# Patient Record
Sex: Female | Born: 1940 | Race: White | Hispanic: No | Marital: Married | Smoking: Former smoker
Health system: Southern US, Community
[De-identification: ages and names within clinical notes are randomized; demographics above are authoritative.]

## PROBLEM LIST (undated history)

## (undated) DIAGNOSIS — M8580 Other specified disorders of bone density and structure, unspecified site: Secondary | ICD-10-CM

## (undated) DIAGNOSIS — E785 Hyperlipidemia, unspecified: Secondary | ICD-10-CM

## (undated) DIAGNOSIS — H269 Unspecified cataract: Secondary | ICD-10-CM

## (undated) DIAGNOSIS — H409 Unspecified glaucoma: Secondary | ICD-10-CM

## (undated) DIAGNOSIS — I10 Essential (primary) hypertension: Secondary | ICD-10-CM

## (undated) HISTORY — DX: Unspecified glaucoma: H40.9

## (undated) HISTORY — DX: Unspecified cataract: H26.9

## (undated) HISTORY — PX: ABDOMINAL HYSTERECTOMY: SHX81

## (undated) HISTORY — DX: Other specified disorders of bone density and structure, unspecified site: M85.80

## (undated) HISTORY — DX: Essential (primary) hypertension: I10

## (undated) HISTORY — DX: Hyperlipidemia, unspecified: E78.5

---

## 1999-06-08 LAB — FECAL OCCULT BLOOD, GUAIAC: Fecal Occult Blood: NEGATIVE

## 1999-06-29 ENCOUNTER — Encounter: Payer: Self-pay | Admitting: Family Medicine

## 1999-06-29 ENCOUNTER — Encounter: Admission: RE | Admit: 1999-06-29 | Discharge: 1999-06-29 | Payer: Self-pay | Admitting: Family Medicine

## 2000-07-22 ENCOUNTER — Encounter: Payer: Self-pay | Admitting: Family Medicine

## 2000-07-22 ENCOUNTER — Encounter: Admission: RE | Admit: 2000-07-22 | Discharge: 2000-07-22 | Payer: Self-pay | Admitting: Family Medicine

## 2001-07-23 ENCOUNTER — Encounter: Admission: RE | Admit: 2001-07-23 | Discharge: 2001-07-23 | Payer: Self-pay | Admitting: Family Medicine

## 2001-07-23 ENCOUNTER — Encounter: Payer: Self-pay | Admitting: Family Medicine

## 2002-10-20 ENCOUNTER — Encounter: Admission: RE | Admit: 2002-10-20 | Discharge: 2002-10-20 | Payer: Self-pay | Admitting: Family Medicine

## 2002-10-20 ENCOUNTER — Encounter: Payer: Self-pay | Admitting: Family Medicine

## 2003-11-11 ENCOUNTER — Ambulatory Visit: Payer: Self-pay | Admitting: Family Medicine

## 2003-11-28 ENCOUNTER — Encounter: Admission: RE | Admit: 2003-11-28 | Discharge: 2003-11-28 | Payer: Self-pay | Admitting: Family Medicine

## 2004-05-15 ENCOUNTER — Ambulatory Visit: Payer: Self-pay | Admitting: Family Medicine

## 2004-05-17 ENCOUNTER — Ambulatory Visit: Payer: Self-pay | Admitting: Family Medicine

## 2004-09-04 ENCOUNTER — Ambulatory Visit: Payer: Self-pay | Admitting: Family Medicine

## 2004-11-14 ENCOUNTER — Ambulatory Visit: Payer: Self-pay | Admitting: Family Medicine

## 2006-01-07 LAB — HM COLONOSCOPY

## 2006-01-09 ENCOUNTER — Ambulatory Visit: Payer: Self-pay | Admitting: Family Medicine

## 2006-01-10 ENCOUNTER — Ambulatory Visit: Payer: Self-pay | Admitting: Gastroenterology

## 2006-01-15 ENCOUNTER — Ambulatory Visit: Payer: Self-pay | Admitting: Cardiology

## 2006-01-24 ENCOUNTER — Encounter: Payer: Self-pay | Admitting: Family Medicine

## 2006-01-24 ENCOUNTER — Encounter (INDEPENDENT_AMBULATORY_CARE_PROVIDER_SITE_OTHER): Payer: Self-pay | Admitting: Specialist

## 2006-01-24 ENCOUNTER — Ambulatory Visit: Payer: Self-pay | Admitting: Gastroenterology

## 2006-01-31 ENCOUNTER — Ambulatory Visit: Payer: Self-pay | Admitting: Family Medicine

## 2006-01-31 LAB — CONVERTED CEMR LAB
Albumin: 4 g/dL (ref 3.5–5.2)
Calcium: 10 mg/dL (ref 8.4–10.5)
Eosinophils Absolute: 0.1 10*3/uL (ref 0.0–0.6)
Eosinophils Relative: 1.9 % (ref 0.0–5.0)
GFR calc Af Amer: 81 mL/min
GFR calc non Af Amer: 67 mL/min
HCT: 42.4 % (ref 36.0–46.0)
Hemoglobin: 14.8 g/dL (ref 12.0–15.0)
LDL Cholesterol: 86 mg/dL (ref 0–99)
MCHC: 35 g/dL (ref 30.0–36.0)
MCV: 92.3 fL (ref 78.0–100.0)
Monocytes Relative: 8.7 % (ref 3.0–11.0)
Neutro Abs: 5.4 10*3/uL (ref 1.4–7.7)
Neutrophils Relative %: 69.9 % (ref 43.0–77.0)
Platelets: 254 10*3/uL (ref 150–400)
Potassium: 4.4 meq/L (ref 3.5–5.1)
RBC: 4.6 M/uL (ref 3.87–5.11)
RDW: 12.4 % (ref 11.5–14.6)
Triglycerides: 192 mg/dL — ABNORMAL HIGH (ref 0–149)
VLDL: 38 mg/dL (ref 0–40)
WBC: 7.7 10*3/uL (ref 4.5–10.5)

## 2006-02-14 ENCOUNTER — Encounter: Admission: RE | Admit: 2006-02-14 | Discharge: 2006-02-14 | Payer: Self-pay | Admitting: Family Medicine

## 2006-04-28 ENCOUNTER — Ambulatory Visit: Payer: Self-pay | Admitting: Family Medicine

## 2006-04-28 LAB — CONVERTED CEMR LAB
ALT: 16 units/L (ref 0–40)
Cholesterol: 143 mg/dL (ref 0–200)
Hgb A1c MFr Bld: 6.5 % — ABNORMAL HIGH (ref 4.6–6.0)
LDL Cholesterol: 71 mg/dL (ref 0–99)
Total CHOL/HDL Ratio: 3.1
Triglycerides: 131 mg/dL (ref 0–149)

## 2006-07-22 ENCOUNTER — Ambulatory Visit: Payer: Self-pay | Admitting: Gastroenterology

## 2006-07-22 LAB — CONVERTED CEMR LAB
Albumin: 3.8 g/dL (ref 3.5–5.2)
BUN: 6 mg/dL (ref 6–23)
Calcium: 9.6 mg/dL (ref 8.4–10.5)
Chloride: 102 meq/L (ref 96–112)
Creatinine, Ser: 0.8 mg/dL (ref 0.4–1.2)
Sodium: 140 meq/L (ref 135–145)
Total Bilirubin: 0.9 mg/dL (ref 0.3–1.2)

## 2006-07-28 ENCOUNTER — Ambulatory Visit: Payer: Self-pay | Admitting: Cardiology

## 2007-02-16 ENCOUNTER — Encounter: Admission: RE | Admit: 2007-02-16 | Discharge: 2007-02-16 | Payer: Self-pay | Admitting: Family Medicine

## 2007-02-18 ENCOUNTER — Encounter (INDEPENDENT_AMBULATORY_CARE_PROVIDER_SITE_OTHER): Payer: Self-pay | Admitting: *Deleted

## 2007-03-05 ENCOUNTER — Encounter (INDEPENDENT_AMBULATORY_CARE_PROVIDER_SITE_OTHER): Payer: Self-pay | Admitting: *Deleted

## 2007-03-12 ENCOUNTER — Encounter: Payer: Self-pay | Admitting: Family Medicine

## 2007-03-12 DIAGNOSIS — E119 Type 2 diabetes mellitus without complications: Secondary | ICD-10-CM | POA: Insufficient documentation

## 2007-03-12 DIAGNOSIS — H409 Unspecified glaucoma: Secondary | ICD-10-CM | POA: Insufficient documentation

## 2007-03-12 DIAGNOSIS — I10 Essential (primary) hypertension: Secondary | ICD-10-CM

## 2007-03-12 DIAGNOSIS — E785 Hyperlipidemia, unspecified: Secondary | ICD-10-CM

## 2007-03-25 ENCOUNTER — Ambulatory Visit: Payer: Self-pay | Admitting: Family Medicine

## 2007-03-27 LAB — CONVERTED CEMR LAB
Albumin: 4.2 g/dL (ref 3.5–5.2)
BUN: 13 mg/dL (ref 6–23)
Basophils Absolute: 0.1 10*3/uL (ref 0.0–0.1)
CO2: 32 meq/L (ref 19–32)
Calcium: 10 mg/dL (ref 8.4–10.5)
Chloride: 96 meq/L (ref 96–112)
Cholesterol: 143 mg/dL (ref 0–200)
Creatinine, Ser: 0.9 mg/dL (ref 0.4–1.2)
GFR calc non Af Amer: 66 mL/min
Glucose, Bld: 150 mg/dL — ABNORMAL HIGH (ref 70–99)
HDL: 46.5 mg/dL (ref >39.0)
Hemoglobin: 15.4 g/dL — ABNORMAL HIGH (ref 12.0–15.0)
Hgb A1c MFr Bld: 7 % — ABNORMAL HIGH (ref 4.6–6.0)
Lymphocytes Relative: 19.5 % (ref 12.0–46.0)
MCHC: 33.2 g/dL (ref 30.0–36.0)
Neutro Abs: 6.2 10*3/uL (ref 1.4–7.7)
RBC: 5.07 M/uL (ref 3.87–5.11)
Total CHOL/HDL Ratio: 3.1
Triglycerides: 147 mg/dL (ref 0–149)
VLDL: 29 mg/dL (ref 0–40)
WBC: 8.8 10*3/uL (ref 4.5–10.5)

## 2007-04-10 ENCOUNTER — Encounter: Payer: Self-pay | Admitting: Family Medicine

## 2007-04-10 ENCOUNTER — Ambulatory Visit: Payer: Self-pay | Admitting: Internal Medicine

## 2007-04-14 ENCOUNTER — Telehealth: Payer: Self-pay | Admitting: Family Medicine

## 2007-04-14 DIAGNOSIS — N951 Menopausal and female climacteric states: Secondary | ICD-10-CM

## 2007-06-22 ENCOUNTER — Ambulatory Visit: Payer: Self-pay | Admitting: Family Medicine

## 2007-06-23 ENCOUNTER — Ambulatory Visit: Payer: Self-pay | Admitting: Family Medicine

## 2007-06-23 LAB — CONVERTED CEMR LAB: Hgb A1c MFr Bld: 6.8 % — ABNORMAL HIGH (ref 4.6–6.0)

## 2008-02-18 ENCOUNTER — Encounter: Admission: RE | Admit: 2008-02-18 | Discharge: 2008-02-18 | Payer: Self-pay | Admitting: Family Medicine

## 2008-02-22 ENCOUNTER — Encounter (INDEPENDENT_AMBULATORY_CARE_PROVIDER_SITE_OTHER): Payer: Self-pay | Admitting: *Deleted

## 2008-03-29 ENCOUNTER — Telehealth (INDEPENDENT_AMBULATORY_CARE_PROVIDER_SITE_OTHER): Payer: Self-pay | Admitting: *Deleted

## 2008-08-02 ENCOUNTER — Ambulatory Visit: Payer: Self-pay | Admitting: Family Medicine

## 2008-08-05 ENCOUNTER — Telehealth: Payer: Self-pay | Admitting: Family Medicine

## 2008-08-05 LAB — CONVERTED CEMR LAB
AST: 16 units/L (ref 0–37)
Albumin: 4.1 g/dL (ref 3.5–5.2)
Alkaline Phosphatase: 69 units/L (ref 39–117)
BUN: 9 mg/dL (ref 6–23)
Basophils Absolute: 0.1 10*3/uL (ref 0.0–0.1)
Bilirubin, Direct: 0.1 mg/dL (ref 0.0–0.3)
HDL: 49.6 mg/dL (ref >39.00)
Hemoglobin: 14.5 g/dL (ref 12.0–15.0)
Hgb A1c MFr Bld: 7 % — ABNORMAL HIGH (ref 4.6–6.5)
Lymphocytes Relative: 20 % (ref 12.0–46.0)
MCHC: 34.6 g/dL (ref 30.0–36.0)
Microalb, Ur: 0.2 mg/dL (ref 0.0–1.9)
Monocytes Absolute: 0.5 10*3/uL (ref 0.1–1.0)
Neutro Abs: 5.1 10*3/uL (ref 1.4–7.7)
Neutrophils Relative %: 70.4 % (ref 43.0–77.0)
Potassium: 4 meq/L (ref 3.5–5.1)
RBC: 4.56 M/uL (ref 3.87–5.11)
Total CHOL/HDL Ratio: 3
Total Protein: 6.7 g/dL (ref 6.0–8.3)
VLDL: 28 mg/dL (ref 0.0–40.0)
Vit D, 25-Hydroxy: 25 ng/mL — ABNORMAL LOW (ref 30–89)

## 2008-08-08 ENCOUNTER — Encounter: Payer: Self-pay | Admitting: Family Medicine

## 2008-08-08 ENCOUNTER — Encounter: Admission: RE | Admit: 2008-08-08 | Discharge: 2008-11-06 | Payer: Self-pay | Admitting: Family Medicine

## 2008-08-09 ENCOUNTER — Telehealth: Payer: Self-pay | Admitting: Family Medicine

## 2008-08-11 ENCOUNTER — Encounter: Payer: Self-pay | Admitting: Family Medicine

## 2008-11-14 ENCOUNTER — Ambulatory Visit: Payer: Self-pay | Admitting: Family Medicine

## 2008-11-15 LAB — CONVERTED CEMR LAB: Hgb A1c MFr Bld: 6.7 % — ABNORMAL HIGH (ref 4.6–6.5)

## 2009-01-30 ENCOUNTER — Ambulatory Visit: Payer: Self-pay | Admitting: Family Medicine

## 2009-01-31 LAB — CONVERTED CEMR LAB
ALT: 15 units/L (ref 0–35)
AST: 18 units/L (ref 0–37)
HDL: 44.7 mg/dL (ref >39.00)
Hgb A1c MFr Bld: 6.3 % (ref 4.6–6.5)
LDL Cholesterol: 56 mg/dL (ref 0–99)
Total CHOL/HDL Ratio: 3
Triglycerides: 118 mg/dL (ref 0.0–149.0)
VLDL: 23.6 mg/dL (ref 0.0–40.0)

## 2009-02-14 ENCOUNTER — Encounter: Payer: Self-pay | Admitting: Family Medicine

## 2009-02-20 ENCOUNTER — Encounter: Admission: RE | Admit: 2009-02-20 | Discharge: 2009-02-20 | Payer: Self-pay | Admitting: Family Medicine

## 2009-02-22 ENCOUNTER — Encounter (INDEPENDENT_AMBULATORY_CARE_PROVIDER_SITE_OTHER): Payer: Self-pay | Admitting: *Deleted

## 2009-07-05 ENCOUNTER — Ambulatory Visit: Payer: Self-pay | Admitting: Family Medicine

## 2009-07-06 LAB — CONVERTED CEMR LAB
ALT: 10 units/L (ref 0–35)
Albumin: 4.2 g/dL (ref 3.5–5.2)
Alkaline Phosphatase: 50 units/L (ref 39–117)
Bilirubin, Direct: 0.2 mg/dL (ref 0.0–0.3)
Cholesterol: 121 mg/dL (ref 0–200)
HDL: 40.7 mg/dL (ref >39.00)
Hgb A1c MFr Bld: 6.1 % (ref 4.6–6.5)
Lymphocytes Relative: 20 % (ref 12.0–46.0)
MCHC: 34.8 g/dL (ref 30.0–36.0)
Phosphorus: 3 mg/dL (ref 2.3–4.6)
Potassium: 4.1 meq/L (ref 3.5–5.1)
Sodium: 138 meq/L (ref 135–145)
Total Bilirubin: 0.6 mg/dL (ref 0.3–1.2)
VLDL: 37.2 mg/dL (ref 0.0–40.0)
Vit D, 25-Hydroxy: 32 ng/mL (ref 30–89)
WBC: 8.8 10*3/uL (ref 4.5–10.5)

## 2009-08-04 ENCOUNTER — Encounter: Payer: Self-pay | Admitting: Family Medicine

## 2009-08-04 ENCOUNTER — Ambulatory Visit: Payer: Self-pay | Admitting: Internal Medicine

## 2009-08-30 ENCOUNTER — Encounter: Payer: Self-pay | Admitting: Family Medicine

## 2010-01-02 ENCOUNTER — Ambulatory Visit: Payer: Self-pay | Admitting: Family Medicine

## 2010-01-05 LAB — CONVERTED CEMR LAB
ALT: 11 units/L (ref 0–35)
BUN: 14 mg/dL (ref 6–23)
CO2: 30 meq/L (ref 19–32)
Calcium: 9.7 mg/dL (ref 8.4–10.5)
GFR calc non Af Amer: 72.26 mL/min (ref >60.00)
Glucose, Bld: 115 mg/dL — ABNORMAL HIGH (ref 70–99)
HDL: 44.3 mg/dL (ref >39.00)
Hgb A1c MFr Bld: 6.2 % (ref 4.6–6.5)
Phosphorus: 3.6 mg/dL (ref 2.3–4.6)
Potassium: 5 meq/L (ref 3.5–5.1)
Sodium: 134 meq/L — ABNORMAL LOW (ref 135–145)
Total CHOL/HDL Ratio: 3
Triglycerides: 91 mg/dL (ref 0.0–149.0)
VLDL: 18.2 mg/dL (ref 0.0–40.0)

## 2010-02-08 NOTE — Assessment & Plan Note (Signed)
Summary: FOLLOW UP AFTER LABS IN JAN/RI   Vital Signs:  Patient profile:   70 year old female Height:      61 inches Weight:      120 pounds BMI:     22.76 Temp:     98.4 degrees F oral Pulse rate:   68 / minute Pulse rhythm:   regular BP sitting:   132 / 78  (left arm) Cuff size:   regular  Vitals Entered By: Lewanda Rife LPN (July 05, 2009 12:33 PM) CC: follow-up visit   History of Present Illness: here for f/u of HTN and DM and lipids   is doing well  keeps 2 grandaughters- that keeps her busy  has been feeling good overall   at times - energy level gets low - off and on  may be the heat  no other physical symptoms  does not think she is overdoing it   sleeping ok   last labs 6 mo ago   last AIC 6.3- good control opthy up to date  wt is down 11 lb this visit -- is not really trying to loose  overall blood sugars are good -- usually 89 - low one teens , one time sugar got low and she recognized it right away (? what caused it - walking at a yard sale)  is eating quite a bit less- watches what she eats with smaller portions / also extremely active outdoors playing with the kids -- for example basketball    bp in good control wtih 132/78  is due for check of vit D after supplementation    Allergies (verified): No Known Drug Allergies  Past History:  Past Medical History: Last updated: 06/23/2007 Diabetes mellitus, type II Hyperlipidemia Hypertension osteopenia  Past Surgical History: Last updated: 03/25/2007 Hysterectomy- ovaries intact- (non cancer) - for bleeding  Colonoscopy- polyp, tics (01/2006)  Family History: Last updated: April 04, 2007 Father: deceased CAD Mother: deceased HTN, CAD Siblings: 3 brothers, 1 with diabetes, 1 died from lymph node cancer  Social History: Last updated: 03/25/2007 Marital Status: Married Children: 2 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 Eyes:  Denies blurring and eye irritation. CV:  Denies chest pain or discomfort, lightheadness, and palpitations. Resp:  Denies cough and wheezing. GI:  Denies abdominal pain, change in bowel habits, and indigestion. GU:  Denies hematuria and urinary frequency. MS:  Denies joint redness, joint swelling, and cramps. Derm:  Denies itching, lesion(s), poor wound healing, and rash. Neuro:  Denies numbness and tingling. Psych:  mood is ok. Endo:  Denies cold intolerance, excessive thirst, excessive urination, and heat intolerance. Heme:  Denies abnormal bruising and bleeding.  Physical Exam  General:  Well-developed,well-nourished,in no acute distress; alert,appropriate and cooperative throughout examination Head:  normocephalic, atraumatic, and no abnormalities observed.   Eyes:  vision grossly intact, pupils equal, pupils round, and pupils reactive to light.   Mouth:  pharynx pink and moist.   Neck:  supple with full rom and no masses or thyromegally, no JVD or carotid bruit  Lungs:  Normal respiratory effort, chest expands symmetrically. Lungs are clear to auscultation, no crackles or wheezes. Heart:  Normal rate and regular rhythm. S1 and S2 normal without gallop, murmur, click, rub or other extra sounds. Abdomen:  Bowel sounds positive,abdomen soft and non-tender without  masses, organomegaly or hernias noted. no renal bruits  Msk:  No deformity or scoliosis noted of thoracic or lumbar spine.   Pulses:  R and L carotid,radial,femoral,dorsalis pedis and posterior tibial pulses are full and equal bilaterally Extremities:  No clubbing, cyanosis, edema, or deformity noted with normal full range of motion of all joints.   Neurologic:  sensation intact to light touch, gait normal, and DTRs symmetrical and normal.   Skin:  Intact without suspicious lesions or rashes Cervical  Nodes:  No lymphadenopathy noted Psych:  normal affect, talkative and pleasant   Diabetes Management Exam:    Foot Exam (with socks and/or shoes not present):       Sensory-Pinprick/Light touch:          Left medial foot (L-4): normal          Left dorsal foot (L-5): normal          Left lateral foot (S-1): normal          Right medial foot (L-4): normal          Right dorsal foot (L-5): normal          Right lateral foot (S-1): normal       Sensory-Monofilament:          Left foot: normal          Right foot: normal       Inspection:          Left foot: normal          Right foot: normal       Nails:          Left foot: normal          Right foot: normal   Impression & Recommendations:  Problem # 1:  HYPERTENSION (ICD-401.9) Assessment Unchanged  fair control with current meds and exercise no changes  lab today Her updated medication list for this problem includes:    Bisoprolol-hydrochlorothiazide 5-6.25 Mg Tabs (Bisoprolol-hydrochlorothiazide) .Marland Kitchen... 1 by mouth once daily    Altace 5 Mg Caps (Ramipril) .Marland Kitchen... 1 by mouth once daily    Hydrochlorothiazide 25 Mg Tabs (Hydrochlorothiazide) .Marland Kitchen... Take one tablet by mouth daily  BP today: 132/78 Prior BP: 120/70 (11/14/2008)  Labs Reviewed: K+: 4.0 (08/02/2008) Creat: : 0.8 (08/02/2008)   Chol: 124 (01/30/2009)   HDL: 44.70 (01/30/2009)   LDL: 56 (01/30/2009)   TG: 118.0 (01/30/2009)  Orders: Venipuncture (16109) TLB-Lipid Panel (80061-LIPID) TLB-Renal Function Panel (80069-RENAL) TLB-CBC Platelet - w/Differential (85025-CBCD) TLB-Hepatic/Liver Function Pnl (80076-HEPATIC) TLB-TSH (Thyroid Stimulating Hormone) (84443-TSH) TLB-A1C / Hgb A1C (Glycohemoglobin) (83036-A1C) T-Vitamin D (25-Hydroxy) (60454-09811) Prescription Created Electronically [phone])  Problem # 2:  FATIGUE (ICD-780.79) Assessment: New new and poss age rel/ multifactorial/ very busy schedule  no specific symptoms and is intermittent lab today and  update Orders: Venipuncture (29562) TLB-Lipid Panel (80061-LIPID) TLB-Renal Function Panel (80069-RENAL) TLB-CBC Platelet - w/Differential (85025-CBCD) TLB-Hepatic/Liver Function Pnl (80076-HEPATIC) TLB-TSH (Thyroid Stimulating Hormone) (84443-TSH) TLB-A1C / Hgb A1C (Glycohemoglobin) (83036-A1C)  Problem # 3:  HYPERLIPIDEMIA (ICD-272.4) Assessment: Unchanged has been controlled with lipitor and diet  lab today  Her updated medication list for this problem includes:    Lipitor 20 Mg Tabs (Atorvastatin calcium) .Marland Kitchen... 1 by mouth once daily  Orders: Venipuncture (13086) TLB-Lipid Panel (80061-LIPID) TLB-Renal Function Panel (80069-RENAL) TLB-CBC Platelet - w/Differential (85025-CBCD) TLB-Hepatic/Liver Function Pnl (80076-HEPATIC) TLB-TSH (Thyroid Stimulating Hormone) (84443-TSH) TLB-A1C / Hgb A1C (Glycohemoglobin) (83036-A1C) T-Vitamin D (25-Hydroxy) (57846-96295) Prescription Created Electronically [phone])  Labs Reviewed: SGOT: 18 (  01/30/2009)   SGPT: 15 (01/30/2009)   HDL:44.70 (01/30/2009), 49.60 (08/02/2008)  LDL:56 (01/30/2009), 56 (08/02/2008)  Chol:124 (01/30/2009), 134 (08/02/2008)  Trig:118.0 (01/30/2009), 140.0 (08/02/2008)  Problem # 4:  DIABETES MELLITUS, TYPE II (ICD-250.00) Assessment: Unchanged sugar has been very well controlled in light of wt loss (dec portions and more exercise)- adv pt to inc portions a bit now  she continues to be very compliant opthy utd  adv to update if any further hypoglycemia  lab today and update f/u 6 mo  (earlier if further wt loss)  Her updated medication list for this problem includes:    Altace 5 Mg Caps (Ramipril) .Marland Kitchen... 1 by mouth once daily    Glucophage 500 Mg Tabs (Metformin hcl) .Marland Kitchen... 1 by mouth two times a day  Orders: Venipuncture (69629) TLB-Lipid Panel (80061-LIPID) TLB-Renal Function Panel (80069-RENAL) TLB-CBC Platelet - w/Differential (85025-CBCD) TLB-Hepatic/Liver Function Pnl (80076-HEPATIC) TLB-TSH (Thyroid  Stimulating Hormone) (84443-TSH) TLB-A1C / Hgb A1C (Glycohemoglobin) (83036-A1C) T-Vitamin D (25-Hydroxy) (52841-32440) Prescription Created Electronically [id number])  Complete Medication List: 1)  Lipitor 20 Mg Tabs (Atorvastatin calcium) .Marland Kitchen.. 1 by mouth once daily 2)  Bisoprolol-hydrochlorothiazide 5-6.25 Mg Tabs (Bisoprolol-hydrochlorothiazide) .Marland Kitchen.. 1 by mouth once daily 3)  Altace 5 Mg Caps (Ramipril) .Marland Kitchen.. 1 by mouth once daily 4)  Hydrochlorothiazide 25 Mg Tabs (Hydrochlorothiazide) .... Take one tablet by mouth daily 5)  Xalatan 0.005 % Soln (Latanoprost) .... Use eye gtts as directed 6)  Timolol Maleate 0.5 % Soln (Timolol maleate) .... Use eye gtts as directed 7)  Fosamax 70 Mg Tabs (Alendronate sodium) .Marland Kitchen.. 1 by mouth every week as directed 8)  Glucometer  .... To check sugar two times a day and as needed for new dm 250.0 9)  Onetouch Ultra Test Strp (Glucose blood) .... For one touch mini to check glucose two times a day and as needed for new dm 250.0 10)  Onetouch Ultrasoft Lancets Misc (Lancets) .... To check glucose two times a day and as needed for new dm 250.0 11)  Glucophage 500 Mg Tabs (Metformin hcl) .Marland Kitchen.. 1 by mouth two times a day  Patient Instructions: 1)  labs today- for fatigue and other medical issue  2)  ok to increase your portions slightly - I do not want you to loose more weight  3)  stay as active as you can  4)  update me if fatigue worsens  5)  follow up in about 6 months  Prescriptions: GLUCOPHAGE 500 MG TABS (METFORMIN HCL) 1 by mouth two times a day  #60 x 11   Entered and Authorized by:   Judith Part MD   Signed by:   Judith Part MD on 07/05/2009   Method used:   Electronically to        [facility] [address] [id number] (retail)       [address]       [place], [place]  [address]       Ph: [phone]       Fax: [phone]   RxID:   [id number] FOSAMAX 70 MG  TABS (ALENDRONATE SODIUM) 1 by mouth every week as directed   #4 x 11   Entered and Authorized by:   Judith Part MD   Signed by:   Judith Part MD on 07/05/2009   Method used:   Electronically to        [facility]  [address] [id number] ([facility]ail[facility]  [address]       [place], [place]  [address]       Ph: [phone]       Fax: [phone]   RxID:   [id number] HYDROCHLOROTHIAZIDE 25 MG  TABS (HYDROCHLOROTHIAZIDE) take one tablet by mouth daily  #30 x 11   Entered and Authorized by:   Judith Part MD   Signed by:   Judith Part MD on 07/05/2009   Method used:   Electronically to        [facility] [address] [id number] ([facility])       [address]       [place], [place]  [address]       Ph: [phone]       Fax: [phone]   RxID:   [id number] ALTACE 5 MG CAPS (RAMIPRIL) 1 by mouth once daily  #30 x 11   Entered and Authorized by:   Judith Part MD   Signed by:   Judith Part MD on 07/05/2009   Method used:   Electronically to        [facility] [address] [id number] ([facility])       [address]       [place], [place]  [address]       Ph: [phone]       Fax: [phone]   RxID:   [id number] BISOPROLOL-HYDROCHLOROTHIAZIDE 5-6.25 MG TABS (BISOPROLOL-HYDROCHLOROTHIAZIDE) 1 by mouth once daily  #30 x 11   Entered and Authorized by:   Judith Part MD   Signed by:   Judith Part MD on 07/05/2009   Method used:   Electronically to        [facility] [address] [id number] ([facility]       [address]       [place], [place]  [address]       Ph: [phone]       Fax: [phone]   RxID:   [id number] LIPITOR 20 MG TABS (ATORVASTATIN CALCIUM) 1 by mouth once daily  #30 x 11   Entered and Authorized by:   Judith Part MD   Signed by:   Judith Part MD on 07/05/2009   Method used:   Electronically to        [facility] [facility] [address] [id number]       [address]       [place], [place]  [address]        Ph: [phone]       Fax: [phone]   RxID:   [id number]   Current Allergies (reviewed today): No known allergies

## 2010-02-08 NOTE — Letter (Signed)
Summary: [facility]   Imported By: Lanelle Bal 09/06/2009 09:50:33  _____________________________________________________________________  External Attachment:    Type:   Image     Comment:   External Document  Appended Document: [facility]     Clinical Lists Changes  Observations: Added new observation of DMEYEEXAMNXT: 09/2010 (09/06/2009 20:09) Added new observation of DMEYEEXMRES: normal (08/30/2009 20:10) Added new observation of EYE EXAM BY: Dr Hazle Quant (08/30/2009 20:10) Added new observation of DIAB EYE EX: normal (08/30/2009 20:10)        Diabetes Management Exam:    Eye Exam:       Eye Exam done elsewhere          Date: 08/30/2009          Results: normal          Done by: Dr Hazle Quant

## 2010-02-08 NOTE — Miscellaneous (Signed)
Summary: BONE DENSITY  Clinical Lists Changes  Orders: Added new Test order of T-Bone Densitometry (77080) - Signed Added new Test order of T-Lumbar Vertebral Assessment (77082) - Signed 

## 2010-02-08 NOTE — Miscellaneous (Signed)
   Clinical Lists Changes  Observations: Added new observation of LAST MAM DAT: 02-20-09 (02/22/2009 10:25) Added new observation of MAMMOGRAM: NORMAIL (02/22/2009 10:25)

## 2010-02-08 NOTE — Letter (Signed)
Summary: [facility]   Imported By: Lanelle Bal 03/23/2009 13:09:47  _____________________________________________________________________  External Attachment:    Type:   Image     Comment:   External Document  Appended Document: [facility]     Clinical Lists Changes  Observations: Added new observation of DMEYEEXAMNXT: 02/2010 (03/26/2009 21:44) Added new observation of DMEYEEXMRES: normal (02/14/2009 21:45) Added new observation of EYE EXAM BY: Dr Bosie Clos Speath (02/14/2009 21:45) Added new observation of DIAB EYE EX: normal (02/14/2009 21:45)        Diabetes Management Exam:    Eye Exam:       Eye Exam done elsewhere          Date: 02/14/2009          Results: normal          Done by: Dr Tor Netters

## 2010-02-08 NOTE — Letter (Signed)
Summary: Results Follow up Letter  [facility]  [address] [place], [place] [address]   Phone: [phone]  Fax: [phone]    02/22/2009 MRN: [id number]     THEORA VANKIRK [address] [place], [place]  [address]    Dear Ms. Joa,  The following are the results of your recent test(s):  Test         Result    Pap Smear:        Normal _____  Not Normal _____ Comments: ______________________________________________________ Cholesterol: LDL(Bad cholesterol):         Your goal is less than:         HDL (Good cholesterol):       Your goal is more than: Comments:  ______________________________________________________ Mammogram:        Normal ___x__  Not Normal _____ Comments:Repeat in 1 year  ___________________________________________________________________ Hemoccult:        Normal _____  Not normal _______ Comments:    _____________________________________________________________________ Other Tests:    We routinely do not discuss normal results over the telephone.  If you desire a copy of the results, or you have any questions about this information we can discuss them at your next office visit.   Sincerely,   Roxy Manns MD

## 2010-02-08 NOTE — Assessment & Plan Note (Signed)
Summary: ROA FOR 6 MONTH FOLLOW-UP/JRR   Vital Signs:  Patient profile:   70 year old female Height:      61 inches Weight:      121 pounds BMI:     22.95 Temp:     98.4 degrees F oral Pulse rate:   72 / minute Pulse rhythm:   regular BP sitting:   130 / 80  (left arm) Cuff size:   regular  Vitals Entered By: Lewanda Rife LPN (January 02, 2010 8:56 AM)  Serial Vital Signs/Assessments:  Time      Position  BP       Pulse  Resp  Temp     By                     130/80                         Judith Part MD  CC: six month f/u   History of Present Illness: here for f/u of HTN and DM and lipids  has been feeling pretty good overall  wt is up 1 lb-- with the holiday   fatigue is a bit better - is just tired first thing in the am   HTN 136/84  DM - due for AIc - last was 6.1-- very well controlled opthy is up to date  checking sugars at home -- usually very good on the average    last lipids good with lipitor and diet  Last Lipid ProfileCholesterol: 121 (07/05/2009 12:56:24 PM)HDL:  40.70 (07/05/2009 12:56:24 PM)LDL:  43 (07/05/2009 12:56:24 PM)Triglycerides:  Last Liver profileSGOT:  14 (07/05/2009 12:56:24 PM)SPGT:  10 (07/05/2009 12:56:24 PM)T. Bili:  0.6 (07/05/2009 12:56:24 PM)Alk Phos:  50 (07/05/2009 12:56:24 PM)   needs to get flu shot     Allergies (verified): No Known Drug Allergies  Past History:  Past Medical History: Last updated: 06/23/2007 Diabetes mellitus, type II Hyperlipidemia Hypertension osteopenia  Past Surgical History: Last updated: 03/25/2007 Hysterectomy- ovaries intact- (non cancer) - for bleeding  Colonoscopy- polyp, tics (01/2006)  Family History: Last updated: 03/26/2007 Father: deceased CAD Mother: deceased HTN, CAD Siblings: 3 brothers, 1 with diabetes, 1 died from lymph node cancer  Social History: Last updated: 03/25/2007 Marital Status: Married Children: 2 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 Eyes:  Denies blurring and eye irritation. CV:  Denies chest pain or discomfort, lightheadness, palpitations, and shortness of breath with exertion. Resp:  Denies cough, shortness of breath, and wheezing. GI:  Denies abdominal pain, change in bowel habits, indigestion, and nausea. GU:  Denies dysuria and urinary frequency. MS:  Denies muscle aches and cramps. Derm:  Denies itching, lesion(s), poor wound healing, and rash. Neuro:  Denies numbness and tingling. Psych:  mood is ok . Endo:  Denies cold intolerance, excessive thirst, excessive urination, and heat intolerance. Heme:  Denies abnormal bruising and bleeding.  Physical Exam  General:  Well-developed,well-nourished,in no acute distress; alert,appropriate and cooperative throughout examination Head:  normocephalic, atraumatic, and no abnormalities observed.   Eyes:  vision grossly intact, pupils equal, pupils round, and pupils reactive to light.   Mouth:  pharynx pink and moist.   Neck:  supple with full rom and no masses or thyromegally, no JVD  or carotid bruit  Chest Wall:  No deformities, masses, or tenderness noted. Lungs:  Normal respiratory effort, chest expands symmetrically. Lungs are clear to auscultation, no crackles or wheezes. Heart:  Normal rate and regular rhythm. S1 and S2 normal without gallop, murmur, click, rub or other extra sounds. Abdomen:  Bowel sounds positive,abdomen soft and non-tender without masses, organomegaly or hernias noted. no renal bruits  Msk:  No deformity or scoliosis noted of thoracic or lumbar spine.   Pulses:  R and L carotid,radial,femoral,dorsalis pedis and posterior tibial pulses are full and equal bilaterally Extremities:  No clubbing, cyanosis, edema, or deformity noted with normal full range of  motion of all joints.   Neurologic:  sensation intact to light touch, gait normal, and DTRs symmetrical and normal.   Skin:  Intact without suspicious lesions or rashes Cervical Nodes:  No lymphadenopathy noted Inguinal Nodes:  No significant adenopathy Psych:  normal affect, talkative and pleasant   Diabetes Management Exam:    Foot Exam (with socks and/or shoes not present):       Sensory-Pinprick/Light touch:          Left medial foot (L-4): normal          Left dorsal foot (L-5): normal          Left lateral foot (S-1): normal          Right medial foot (L-4): normal          Right dorsal foot (L-5): normal          Right lateral foot (S-1): normal       Sensory-Monofilament:          Left foot: normal          Right foot: normal       Inspection:          Left foot: normal          Right foot: normal       Nails:          Left foot: normal          Right foot: normal   Impression & Recommendations:  Problem # 1:  HYPERTENSION (ICD-401.9) Assessment Unchanged  this is in fair control without change  disc good lifestyle habits lab today pt will get flu shot at drugstore  check up 6 mo  Her updated medication list for this problem includes:    Bisoprolol-hydrochlorothiazide 5-6.25 Mg Tabs (Bisoprolol-hydrochlorothiazide) .Marland Kitchen... 1 by mouth once daily    Altace 5 Mg Caps (Ramipril) .Marland Kitchen... 1 by mouth once daily    Hydrochlorothiazide 25 Mg Tabs (Hydrochlorothiazide) .Marland Kitchen... Take one tablet by mouth daily  Orders: Venipuncture (45409) TLB-Lipid Panel (80061-LIPID) TLB-Renal Function Panel (80069-RENAL) TLB-ALT (SGPT) (84460-ALT) TLB-AST (SGOT) (84450-SGOT) TLB-A1C / Hgb A1C (Glycohemoglobin) (83036-A1C)  BP today: 136/84-- re check 130/80 Prior BP: 132/78 (07/05/2009)  Labs Reviewed: K+: 4.1 (07/05/2009) Creat: : 0.6 (07/05/2009)   Chol: 121 (07/05/2009)   HDL: 40.70 (07/05/2009)   LDL: 43 (07/05/2009)   TG: 186.0 (07/05/2009)  Problem # 2:  DIABETES MELLITUS, TYPE  II (ICD-250.00) Assessment: Unchanged  this has been fairly stable AIC today  adv to get flu shot utd opthy f/u 6 mo PE Her updated medication list for this problem includes:    Altace 5 Mg Caps (Ramipril) .Marland Kitchen... 1 by mouth once daily    Glucophage 500 Mg Tabs (Metformin hcl) .Marland Kitchen... 1 by mouth two times a day  Orders: Venipuncture (81191) TLB-Lipid Panel (80061-LIPID) TLB-Renal  Function Panel (80069-RENAL) TLB-ALT (SGPT) (84460-ALT) TLB-AST (SGOT) (84450-SGOT) TLB-A1C / Hgb A1C (Glycohemoglobin) (83036-A1C)  Labs Reviewed: Creat: 0.6 (07/05/2009)     Last Eye Exam: normal (08/30/2009) Reviewed HgBA1c results: 6.1 (07/05/2009)  6.3 (01/30/2009)  Problem # 3:  HYPERLIPIDEMIA (ICD-272.4) Assessment: Unchanged  has been well controlled with lipitor and diet rev low sat fat diet  lab today check up 6 mo  Her updated medication list for this problem includes:    Lipitor 20 Mg Tabs (Atorvastatin calcium) .Marland Kitchen... 1 by mouth once daily  Orders: Venipuncture ([address]) TLB-Lipid Panel (80061-LIPID) TLB-Renal Function Panel (80069-RENAL) TLB-ALT (SGPT) (84460-ALT) TLB-AST (SGOT) (84450-SGOT) TLB-A1C / Hgb A1C (Glycohemoglobin) (83036-A1C)  Labs Reviewed: SGOT: 14 (07/05/2009)   SGPT: 10 (07/05/2009)   HDL:40.70 (07/05/2009), 44.70 (01/30/2009)  LDL:43 (07/05/2009), 56 (01/30/2009)  Chol:121 (07/05/2009), 124 (01/30/2009)  Trig:186.0 (07/05/2009), 118.0 (01/30/2009)  Complete Medication List: 1)  Lipitor 20 Mg Tabs (Atorvastatin calcium) .Marland Kitchen.. 1 by mouth once daily 2)  Bisoprolol-hydrochlorothiazide 5-6.25 Mg Tabs (Bisoprolol-hydrochlorothiazide) .Marland Kitchen.. 1 by mouth once daily 3)  Altace 5 Mg Caps (Ramipril) .Marland Kitchen.. 1 by mouth once daily 4)  Hydrochlorothiazide 25 Mg Tabs (Hydrochlorothiazide) .... Take one tablet by mouth daily 5)  Xalatan 0.005 % Soln (Latanoprost) .... Use eye gtts as directed 6)  Timolol Maleate 0.5 % Soln (Timolol maleate) .... Use eye gtts as directed 7)  Fosamax  70 Mg Tabs (Alendronate sodium) .Marland Kitchen.. 1 by mouth every week as directed 8)  Glucometer  .... To check sugar two times a day and as needed for new dm 250.0 9)  Onetouch Ultra Test Strp (Glucose blood) .... For one touch mini to check glucose two times a day and as needed for new dm 250.0 10)  Onetouch Ultrasoft Lancets Misc (Lancets) .... To check glucose two times a day and as needed for new dm 250.0 11)  Glucophage 500 Mg Tabs (Metformin hcl) .Marland Kitchen.. 1 by mouth two times a day 12)  One Touch Ultra Lancet Device  .... To check sugar once daily and as needed for dm 250.0  Patient Instructions: 1)  please go to a pharmacy to get your flu shot  2)  labs today  3)  schedule 30 minute check up in about 6 months  4)  keep up the good diet and exercise  Prescriptions: ONE TOUCH ULTRA LANCET DEVICE to check sugar once daily and as needed for DM 250.0  #1 x 0   Entered and Authorized by:   Judith Part MD   Signed by:   Judith Part MD on 01/02/2010   Method used:   Print then Give to Patient   RxID:   [id number]    Orders Added: 1)  Venipuncture [95621] 2)  TLB-Lipid Panel [80061-LIPID] 3)  TLB-Renal Function Panel [80069-RENAL] 4)  TLB-ALT (SGPT) [84460-ALT] 5)  TLB-AST (SGOT) [84450-SGOT] 6)  TLB-A1C / Hgb A1C (Glycohemoglobin) [83036-A1C] 7)  Est. Patient Level IV [30865]    Current Allergies (reviewed today): No known allergies

## 2010-02-12 ENCOUNTER — Other Ambulatory Visit: Payer: Self-pay | Admitting: Family Medicine

## 2010-02-12 DIAGNOSIS — Z1239 Encounter for other screening for malignant neoplasm of breast: Secondary | ICD-10-CM

## 2010-02-16 ENCOUNTER — Ambulatory Visit (INDEPENDENT_AMBULATORY_CARE_PROVIDER_SITE_OTHER): Payer: MEDICARE | Admitting: Family Medicine

## 2010-02-16 ENCOUNTER — Encounter: Payer: Self-pay | Admitting: Family Medicine

## 2010-02-16 DIAGNOSIS — J209 Acute bronchitis, unspecified: Secondary | ICD-10-CM

## 2010-02-22 ENCOUNTER — Ambulatory Visit
Admission: RE | Admit: 2010-02-22 | Discharge: 2010-02-22 | Disposition: A | Discharge status: Home / Self Care | Payer: MEDICARE | Source: Ambulatory Visit | Attending: Family Medicine | Admitting: Family Medicine

## 2010-02-22 DIAGNOSIS — Z1239 Encounter for other screening for malignant neoplasm of breast: Secondary | ICD-10-CM

## 2010-02-27 ENCOUNTER — Encounter: Payer: Self-pay | Admitting: Family Medicine

## 2010-02-28 NOTE — Assessment & Plan Note (Signed)
Summary: COUGH/CLE   [employer]   Vital Signs:  Patient profile:   70 year old female Height:      61 inches Weight:      121 pounds BMI:     22.95 O2 Sat:      97 % on Room air Temp:     99.2 degrees F oral Pulse rate:   80 / minute Pulse rhythm:   regular Resp:     20 per minute BP sitting:   134 / 90  (left arm) Cuff size:   regular  O2 Flow:  Room air CC: productive cough with yellow green phlegm, chest congested.   History of Present Illness: had a cold last week  got a little better and then she got worse  cough is severe at times  productive of yellow and green phlegm    not taking otc meds  lemon juice and honey and hot water helps a bit   no sob  no wheezing   head congestion is better but nose is running clear   99.2 today-- fever has been low grade  some mild chills and aches at first   Allergies (verified): No Known Drug Allergies  Past History:  Past Medical History: Last updated: 06/23/2007 Diabetes mellitus, type II Hyperlipidemia Hypertension osteopenia  Past Surgical History: Last updated: 03/25/2007 Hysterectomy- ovaries intact- (non cancer) - for bleeding  Colonoscopy- polyp, tics (01/2006)  Family History: Last updated: 04/11/2007 Father: deceased CAD Mother: deceased HTN, CAD Siblings: 3 brothers, 1 with diabetes, 1 died from lymph node cancer  Social History: Last updated: 03/25/2007 Marital Status: Married Children: 2 Occupation: retired keeps 2 y old grand daughter 5 days per week  quit smoking in 2007  Risk Factors: Smoking Status: current (April 11, 2007)  Review of Systems General:  Complains of chills, fatigue, fever, loss of appetite, and malaise. Eyes:  Complains of eye irritation; denies blurring and discharge. ENT:  Complains of hoarseness, nasal congestion, postnasal drainage, and sore throat. CV:  Denies chest pain or discomfort and palpitations. Resp:  Complains of cough and sputum productive; denies pleuritic,  shortness of breath, and wheezing. GI:  Denies abdominal pain, change in bowel habits, nausea, and vomiting. Derm:  Denies rash.  Physical Exam  General:  Well-developed,well-nourished,in no acute distress; alert,appropriate and cooperative throughout examination Head:  normocephalic, atraumatic, and no abnormalities observed.  no sinus tenderness  Eyes:  vision grossly intact, pupils equal, pupils round, pupils reactive to light, and no injection.   Ears:  R ear normal and L ear normal.   Nose:  nares boggy but clear scant blood/ scab in R nare Mouth:  pharynx pink and moist, no erythema, and no exudates.   Neck:  supple with full rom and no masses or thyromegally, no JVD or carotid bruit  Chest Wall:  No deformities, masses, or tenderness noted. Lungs:  CTA with harsh bs and diffuse rhonchi throughout  no rales  scant wheeze on forced exp only Heart:  Normal rate and regular rhythm. S1 and S2 normal without gallop, murmur, click, rub or other extra sounds. Skin:  Intact without suspicious lesions or rashes Cervical Nodes:  No lymphadenopathy noted Psych:  normal affect, talkative and pleasant    Impression & Recommendations:  Problem # 1:  ACUTE BRONCHITIS (ICD-466.0) Assessment New  with productive cough and worsening symptoms after uri cover with zithromax  guifen- codcough syrup with caution  pt advised to update me if symptoms worsen or do not improve  recommend sympt care- see pt instructions   Her updated medication list for this problem includes:    Zithromax Z-pak 250 Mg Tabs (Azithromycin) .Marland Kitchen... Take by mouth as directed    Guaifenesin-codeine 100-10 Mg/30ml Syrp (Guaifenesin-codeine) .Marland Kitchen... 1-2 teaspoons by mouth up to every 4-6 hours as needed cough warn- may sedate  Orders: Prescription Created Electronically [phone])  Complete Medication List: 1)  Lipitor 20 Mg Tabs (Atorvastatin calcium) .Marland Kitchen.. 1 by mouth once daily 2)  Bisoprolol-hydrochlorothiazide 5-6.25 Mg  Tabs (Bisoprolol-hydrochlorothiazide) .Marland Kitchen.. 1 by mouth once daily 3)  Altace 5 Mg Caps (Ramipril) .Marland Kitchen.. 1 by mouth once daily 4)  Hydrochlorothiazide 25 Mg Tabs (Hydrochlorothiazide) .... Take one tablet by mouth daily 5)  Xalatan 0.005 % Soln (Latanoprost) .... Use eye gtts as directed 6)  Timolol Maleate 0.5 % Soln (Timolol maleate) .... Use eye gtts as directed 7)  Fosamax 70 Mg Tabs (Alendronate sodium) .Marland Kitchen.. 1 by mouth every week as directed 8)  Glucometer  .... To check sugar two times a day and as needed for new dm 250.0 9)  Onetouch Ultra Test Strp (Glucose blood) .... For one touch mini to check glucose two times a day and as needed for new dm 250.0 10)  Glucophage 500 Mg Tabs (Metformin hcl) .Marland Kitchen.. 1 by mouth two times a day 11)  One Touch Ultra Lancet Device  .... To check sugar once daily and as needed for dm 250.0 12)  Zithromax Z-pak 250 Mg Tabs (Azithromycin) .... Take by mouth as directed 13)  Guaifenesin-codeine 100-10 Mg/32ml Syrp (Guaifenesin-codeine) .Marland Kitchen.. 1-2 teaspoons by mouth up to every 4-6 hours as needed cough warn- may sedate  Patient Instructions: 1)  use nasal saline spray for nasal congestion  2)  claritin or zyrtec for runny nose if needed  3)  take zithromax for bronchitis  4)  codiene cough syrup with caution of sedation  5)  update me if worse or not improving next week Prescriptions: GUAIFENESIN-CODEINE 100-10 MG/5ML SYRP (GUAIFENESIN-CODEINE) 1-2 teaspoons by mouth up to every 4-6 hours as needed cough warn- may sedate  #120cc x 0   Entered and Authorized by:   Judith Part MD   Signed by:   Judith Part MD on 02/16/2010   Method used:   Print then Give to Patient   RxID:   [id number] ZITHROMAX Z-PAK 250 MG TABS (AZITHROMYCIN) take by mouth as directed  #1 pack x 0   Entered and Authorized by:   Judith Part MD   Signed by:   Judith Part MD on 02/16/2010   Method used:   [facility] to        [id number]        [address]       [place], [place]  [address]       Ph: [phone]       Fax: [phone]   RxID:   [id number]    Orders Added: 1)  Prescription Created Electronically [G8553] 2)  Est. Patient Level III [87564]    Current Allergies (reviewed today): No known allergies

## 2010-03-06 NOTE — Letter (Signed)
Summary: Results Follow up Letter  [facility]  [address] [place], [place] [address]   Phone: [phone]  Fax: [phone]    02/27/2010 MRN: [id number]  Candace Newman [address] [facility] [place], [place]  [address]  [place]  Dear [name],  The following are the results of your recent test(s):  Test         Result    Pap Smear:        Normal _____  Not Normal _____ Comments: ______________________________________________________ Cholesterol: LDL(Bad cholesterol):         Your goal is less than:         HDL (Good cholesterol):       Your goal is more than: Comments:  ______________________________________________________ Mammogram:        Normal __X___  Not Normal _____ Comments: mammogram is normal, and next mammogram due in 1 year ___________________________________________________________________ Hemoccult:        Normal _____  Not normal _______ Comments:    _____________________________________________________________________ Other Tests:    We routinely do not discuss normal results over the telephone.  If you desire a copy of the results, or you have any questions about this information we can discuss them at your next office visit.   Sincerely,     Roxy Manns, MD

## 2010-04-06 ENCOUNTER — Other Ambulatory Visit: Payer: Self-pay | Admitting: *Deleted

## 2010-04-06 MED ORDER — ONETOUCH ULTRASOFT LANCETS MISC: Status: DC

## 2010-04-23 ENCOUNTER — Other Ambulatory Visit: Payer: Self-pay | Admitting: *Deleted

## 2010-04-23 MED ORDER — GLUCOSE BLOOD VI STRP: ORAL_STRIP | Status: DC

## 2010-04-23 NOTE — Telephone Encounter (Signed)
Opened in error

## 2010-05-25 NOTE — Assessment & Plan Note (Signed)
[facility]                         GASTROENTEROLOGY OFFICE NOTE   Candace Newman, Candace Newman                     MRN:          [id number]  DATE:01/10/2006                            DOB:          [birth year]    REFERRING PHYSICIAN:  Marne A. [name], MD   REASON FOR REFERRAL:  Dr. Milinda Antis has asked me to evaluate Ms. Arizpe in  consultation regarding recent abdominal pain and rectal bleeding.   HISTORY OF PRESENT ILLNESS:  Candace Newman is a very pleasant 70 year old  woman who has never had problems with bleeding or abdominal pain until  Wednesday.  On Wednesday, she had lower abdominal cramping and multiple  episodes of bright red blood per rectum.  She said she probably had  bleeding 10-12 times on Wednesday.  Thursday, things had started easing  up, and she had 2 or 3 episodes of the bleeding, and the cramping had  started resolving.  Today, the cramping is nearly gone, and she has only  had some pink-tinged output this morning.  She has been fairly well,  other than an upper respiratory type infection in the past couple weeks.  She has not had dramatic hypotension, from what I can tell.   REVIEW OF SYSTEMS:  She has had no shortness of breath, no  lightheadedness.  She has had some fatigue, especially on Wednesday, but  this has improved yesterday, and she is back to normal today.  The rest  of her review of systems is essentially and is available on our nursing  intake sheet.   PAST MEDICAL HISTORY:  1. Hypertension.  2. Elevated cholesterol.  3. Hysterectomy in 1980.  4. Borderline diabetes.   CURRENT MEDICATIONS:  Lipitor, hydrochlorothiazide,  bisoprolol/hydrochlorothiazide, Altace, Betimol eye drops, and Xalatan  eye drops.   ALLERGIES:  No known drug allergies.   SOCIAL HISTORY:  Married, with two children.  Works as a Producer, television/film/video.  Smokes half a pack a day.  Nondrinker.   FAMILY HISTORY:  No colon cancer or colon polyps in the  family.   PHYSICAL EXAMINATION:  VITAL SIGNS:  Height 5 feet 3 inches, 134 pounds,  blood pressure 102/80, pulse 82.  CONSTITUTIONAL:  Generally well appearing.  NEUROLOGIC:  Alert and oriented x3.  HEENT:  Eyes:  Extraocular movements intact.  Mouth:  Oropharynx moist.  No lesions.  NECK:  Supple.  No lymphadenopathy.  CARDIOVASCULAR:  Heart:  Regular rate and rhythm.  LUNGS:  Clear to auscultation bilaterally.  ABDOMEN:  Soft, nontender, nondistended.  Normal bowel sounds.  EXTREMITIES:  No lower extremity edema.  SKIN:  No rashes or lesions are visible on the extremities.   ASSESSMENT AND PLAN:  A 70 year old woman with abdominal cramping and  rectal bleeding.   First, her rectal bleeding and her cramping both seem much improved  since Wednesday.  Differential diagnosis includes ischemic colitis,  infection (I doubt infection), and diverticular bleeding.  She should  undergo full colonoscopy to evaluate this most completely.  Prior to  that, though, I want her to get a CT scan.  Since pain was a significant  component,  I want to see if she has dramatic changes of ischemic  colitis.  I doubt it, based on her physical exam, and that she is  improving clinically.  She will also get baseline labs today, including  a CBC, complete metabolic profile, and pro time.  I do not think she is  anemic based on the way she looks today in the office, but it would be  safe just to be sure.     Rachael Fee, MD  Electronically Signed    DPJ/MedQ  DD: 01/10/2006  DT: 01/10/2006  Job #: [id number]   cc:   Marne A. Milinda Antis, MD

## 2010-06-28 ENCOUNTER — Ambulatory Visit: Payer: Self-pay | Admitting: Family Medicine

## 2010-06-29 ENCOUNTER — Ambulatory Visit: Payer: Self-pay | Admitting: Family Medicine

## 2010-07-14 ENCOUNTER — Encounter: Payer: Self-pay | Admitting: Family Medicine

## 2010-07-19 ENCOUNTER — Other Ambulatory Visit: Payer: Self-pay | Admitting: Family Medicine

## 2010-07-19 NOTE — Telephone Encounter (Signed)
[facility] electronically request refill Ziac 5-6.25 #30 with 6 refills and Fosamax 70mg  #4 with 6 refills. Pt already has scheduled appt with Dr Milinda Antis 07/23/10.

## 2010-07-23 ENCOUNTER — Encounter: Payer: Self-pay | Admitting: Family Medicine

## 2010-07-23 ENCOUNTER — Ambulatory Visit (INDEPENDENT_AMBULATORY_CARE_PROVIDER_SITE_OTHER): Payer: Medicare Other | Admitting: Family Medicine

## 2010-07-23 DIAGNOSIS — E785 Hyperlipidemia, unspecified: Secondary | ICD-10-CM

## 2010-07-23 DIAGNOSIS — Z23 Encounter for immunization: Secondary | ICD-10-CM

## 2010-07-23 DIAGNOSIS — E119 Type 2 diabetes mellitus without complications: Secondary | ICD-10-CM

## 2010-07-23 DIAGNOSIS — I10 Essential (primary) hypertension: Secondary | ICD-10-CM

## 2010-07-23 LAB — HEMOGLOBIN A1C: Hgb A1c MFr Bld: 6.3 % (ref 4.6–6.5)

## 2010-07-23 NOTE — Patient Instructions (Signed)
Pneumonia vaccine today  If you are interested in shingles vaccine in future - call your insurance company to see how coverage is and call us to schedule  I like to separate vaccines by a month  Diabetes labs today  Schedule follow up for 30 minute check up (health mt ) with labs prior in 6 months

## 2010-07-23 NOTE — Progress Notes (Signed)
Subjective:    Patient ID: Candace Newman, female    DOB: May 16, 1940, 70 y.o.   MRN: [id number]  HPI Here for f/u of chronic health problems DM and HTN and hyperlipidemia  Feels fine - no new medical problems  Wt is down 3 lb Quite active in the summer  Some gardening    HTN in fair control 136/80 today No cp or palpitations or headaches   Mam 2/12 normal   Zoster status- has had zoster twice  May be interested in imm   Pneumovax - needs this and will get it today  Last A1c great at 6.2- on metformin  Checks sugars once in a while  Meter is broken -- needs new one - one touch mini Diet is good in general - avoids sweets  Has her opthy appt sched for aug  Lipids excellent in dec with lipitor and diet Lab Results  Component Value Date   CHOL 126 01/02/2010   CHOL 121 07/05/2009   CHOL 124 01/30/2009   Lab Results  Component Value Date   HDL 44.30 01/02/2010   HDL 40.70 07/05/2009   HDL 40.98 01/30/2009   Lab Results  Component Value Date   LDLCALC 64 01/02/2010   LDLCALC 43 07/05/2009   LDLCALC 56 01/30/2009   Lab Results  Component Value Date   TRIG 91.0 01/02/2010   TRIG 186.0* 07/05/2009   TRIG 118.0 01/30/2009   Lab Results  Component Value Date   CHOLHDL 3 01/02/2010   CHOLHDL 3 07/05/2009   CHOLHDL 3 01/30/2009   No results found for this basename: LDLDIRECT    Patient Active Problem List  Diagnoses  . DIABETES MELLITUS, TYPE II  . HYPERLIPIDEMIA  . GLAUCOMA  . HYPERTENSION  . POSTMENOPAUSAL STATUS  . OSTEOPENIA   Past Medical History  Diagnosis Date  . Diabetes mellitus     type II  . Hyperlipidemia   . Hypertension   . Osteopenia    Past Surgical History  Procedure Date  . Abdominal hysterectomy     ovaries intact -non cancer done for bleeding   History  Substance Use Topics  . Smoking status: Former Smoker    Quit date: 01/07/2005  . Smokeless tobacco: Not on file  . Alcohol Use:    Family History  Problem Relation Age of  Onset  . Heart disease Mother     CAD  . Hypertension Mother   . Heart disease Father     CAD  . Diabetes Brother   . Cancer Brother     lymph node CA   No Known Allergies Current Outpatient Prescriptions on File Prior to Visit  Medication Sig Dispense Refill  . alendronate (FOSAMAX) 70 MG tablet TAKE 1 TABLET BY MOUTH EVERY WEEK AS DIRECTED  4 tablet  6  . atorvastatin (LIPITOR) 20 MG tablet Take 20 mg by mouth daily.        . bisoprolol-hydrochlorothiazide (ZIAC) 5-6.25 MG per tablet TAKE 1 TABLET BY MOUTH EVERY DAY  30 tablet  6  . glucose blood (ONE TOUCH ULTRA TEST) test strip Use as instructed  100 each  12  . glucose monitoring kit (FREESTYLE) monitoring kit Check blood sugar twice a day and as needed for new DM 250.0       . hydrochlorothiazide 25 MG tablet Take 25 mg by mouth daily.        . Lancets (ONETOUCH ULTRASOFT) lancets Use as instructed  100 each  12  . latanoprost (  XALATAN) 0.005 % ophthalmic solution Use eye drops as directed.       . metFORMIN (GLUCOPHAGE) 500 MG tablet Take 500 mg by mouth 2 (two) times daily with a meal.        . ramipril (ALTACE) 5 MG capsule Take 5 mg by mouth daily.        . timolol (BETIMOL) 0.5 % ophthalmic solution Use eye drops as directed.            Review of Systems Review of Systems  Constitutional: Negative for fever, appetite change, fatigue and unexpected weight change.  Eyes: Negative for pain and visual disturbance.  Respiratory: Negative for cough and shortness of breath.   Cardiovascular: Negative.  for cp or palpitations Gastrointestinal: Negative for nausea, diarrhea and constipation.  Genitourinary: Negative for urgency and frequency.  Skin: Negative for pallor. no rash  Neurological: Negative for weakness, light-headedness, numbness and headaches.  Hematological: Negative for adenopathy. Does not bruise/bleed easily.  Psychiatric/Behavioral: Negative for dysphoric mood. The patient is not nervous/anxious.           Objective:   Physical Exam  Constitutional: She appears well-developed and well-nourished. No distress.  HENT:  Head: Normocephalic and atraumatic.  Mouth/Throat: Oropharynx is clear and moist.  Eyes: Conjunctivae and EOM are normal. Pupils are equal, round, and reactive to light.  Neck: Normal range of motion. Neck supple. No JVD present. Carotid bruit is not present. No thyromegaly present.  Cardiovascular: Normal rate, regular rhythm, normal heart sounds and intact distal pulses.   Pulmonary/Chest: Effort normal and breath sounds normal. No respiratory distress. She has no wheezes.  Abdominal: Soft. Bowel sounds are normal. She exhibits no distension, no abdominal bruit and no mass. There is no tenderness.  Musculoskeletal: She exhibits no edema and no tenderness.  Lymphadenopathy:    She has no cervical adenopathy.  Neurological: She is alert. She has normal reflexes. Coordination normal.  Skin: Skin is warm and dry. No rash noted. No erythema. No pallor.  Psychiatric: She has a normal mood and affect.          Assessment & Plan:

## 2010-07-23 NOTE — Assessment & Plan Note (Signed)
a1c today  Suspect good control  Adv to check sugar once daily at diff times  New meter px  Disc diet- is good opthy planned for next mo Pneumovax today Considering zoster vac later

## 2010-08-06 ENCOUNTER — Other Ambulatory Visit: Payer: Self-pay | Admitting: *Deleted

## 2010-08-06 MED ORDER — METFORMIN HCL 500 MG PO TABS: 500.0000 mg | ORAL_TABLET | Freq: Two times a day (BID) | ORAL | Status: DC

## 2010-08-06 MED ORDER — ATORVASTATIN CALCIUM 20 MG PO TABS: 20.0000 mg | ORAL_TABLET | Freq: Every day | ORAL | Status: DC

## 2010-08-09 ENCOUNTER — Other Ambulatory Visit: Payer: Self-pay

## 2010-08-09 MED ORDER — HYDROCHLOROTHIAZIDE 25 MG PO TABS: 25.0000 mg | ORAL_TABLET | Freq: Every day | ORAL | Status: DC

## 2010-08-09 NOTE — Telephone Encounter (Signed)
[facility] faxed refill request for HCTZ 25mg  #30 x6 refills given.

## 2010-09-06 LAB — HM DIABETES EYE EXAM

## 2010-10-15 ENCOUNTER — Encounter: Payer: Self-pay | Admitting: Family Medicine

## 2010-11-05 ENCOUNTER — Ambulatory Visit (INDEPENDENT_AMBULATORY_CARE_PROVIDER_SITE_OTHER): Payer: Medicare Other | Admitting: Family Medicine

## 2010-11-05 ENCOUNTER — Encounter: Payer: Self-pay | Admitting: Family Medicine

## 2010-11-05 DIAGNOSIS — R05 Cough: Secondary | ICD-10-CM

## 2010-11-05 MED ORDER — BENZONATATE 200 MG PO CAPS: 200.0000 mg | ORAL_CAPSULE | Freq: Three times a day (TID) | ORAL | Status: AC | PRN

## 2010-11-05 NOTE — Patient Instructions (Signed)
Drink plenty of fluids, take tylenol as needed, and take tessalon as needed for cough.  This should gradually improve.  Take care.  Let us know if you have other concerns.

## 2010-11-05 NOTE — Progress Notes (Signed)
She had to cancel eye surgery this AM due to potential for coughing  duration of symptoms: ~1 week Rhinorrhea:yes, some better now congestion:no ear pain: prev with L ear pain sore throat: no Cough: yes, some sputum.  Prev smoked about 20 years. Overall the cough is some better today.  Myalgias: no other concerns: no fevers.  More cough in AM than later in the day.  ROS: See HPI.  Otherwise negative.    Meds, vitals, and allergies reviewed.   GEN: nad, alert and oriented HEENT: mucous membranes moist, TM w/o erythema, nasal epithelium minimally injected, OP with cobblestoning but w/o exudates NECK: supple w/o LA CV: rrr. PULM: ctab, no inc wob, no wheeze, no focal dec in BS EXT: no edema

## 2010-11-06 DIAGNOSIS — R05 Cough: Secondary | ICD-10-CM | POA: Insufficient documentation

## 2010-11-06 NOTE — Assessment & Plan Note (Signed)
Improved, but not fully resolved.  Likely viral process that is in the midst of resolution.  Nontoxic, use tessalon prn and f/u prn.  She agrees.  ddx d/w pt.

## 2011-01-08 LAB — HM DIABETES EYE EXAM

## 2011-01-10 ENCOUNTER — Encounter: Payer: Self-pay | Admitting: Gastroenterology

## 2011-01-28 ENCOUNTER — Encounter: Payer: Self-pay | Admitting: Family Medicine

## 2011-01-28 ENCOUNTER — Ambulatory Visit (INDEPENDENT_AMBULATORY_CARE_PROVIDER_SITE_OTHER): Payer: BC Managed Care – PPO | Admitting: Family Medicine

## 2011-01-28 VITALS — BP 128/80 | HR 80 | Temp 98.2°F | Ht 61.0 in | Wt 120.5 lb

## 2011-01-28 DIAGNOSIS — Z23 Encounter for immunization: Secondary | ICD-10-CM

## 2011-01-28 DIAGNOSIS — E119 Type 2 diabetes mellitus without complications: Secondary | ICD-10-CM

## 2011-01-28 DIAGNOSIS — I10 Essential (primary) hypertension: Secondary | ICD-10-CM

## 2011-01-28 DIAGNOSIS — E785 Hyperlipidemia, unspecified: Secondary | ICD-10-CM

## 2011-01-28 LAB — COMPREHENSIVE METABOLIC PANEL
ALT: 12 U/L (ref 0–35)
AST: 15 U/L (ref 0–37)
Alkaline Phosphatase: 44 U/L (ref 39–117)
Sodium: 133 mEq/L — ABNORMAL LOW (ref 135–145)
Total Bilirubin: 0.5 mg/dL (ref 0.3–1.2)
Total Protein: 6.7 g/dL (ref 6.0–8.3)

## 2011-01-28 LAB — HEMOGLOBIN A1C: Hgb A1c MFr Bld: 6.1 % (ref 4.6–6.5)

## 2011-01-28 LAB — LIPID PANEL
LDL Cholesterol: 32 mg/dL (ref 0–99)
Total CHOL/HDL Ratio: 2
VLDL: 29.8 mg/dL (ref 0.0–40.0)

## 2011-01-28 MED ORDER — RAMIPRIL 5 MG PO CAPS: 5.0000 mg | ORAL_CAPSULE | Freq: Every day | ORAL | Status: DC

## 2011-01-28 MED ORDER — ALENDRONATE SODIUM 70 MG PO TABS: 70.0000 mg | ORAL_TABLET | ORAL | Status: DC

## 2011-01-28 MED ORDER — BISOPROLOL-HYDROCHLOROTHIAZIDE 5-6.25 MG PO TABS: 1.0000 | ORAL_TABLET | Freq: Every day | ORAL | Status: DC

## 2011-01-28 MED ORDER — HYDROCHLOROTHIAZIDE 25 MG PO TABS: 25.0000 mg | ORAL_TABLET | Freq: Every day | ORAL | Status: DC

## 2011-01-28 MED ORDER — METFORMIN HCL 500 MG PO TABS: 500.0000 mg | ORAL_TABLET | Freq: Two times a day (BID) | ORAL | Status: DC

## 2011-01-28 MED ORDER — ATORVASTATIN CALCIUM 20 MG PO TABS: 20.0000 mg | ORAL_TABLET | Freq: Every day | ORAL | Status: DC

## 2011-01-28 NOTE — Assessment & Plan Note (Signed)
Good control Sugars good in am for once daily checks  On ace , good foot care and opthy utd Will continue metformin with good diet and exercise Flu shot today  Lab today F/u 6 mo  Also enc zoster vaccine in future

## 2011-01-28 NOTE — Patient Instructions (Signed)
Flu shot today Labs today  If you are interested in a shingles/zoster vaccine - call your insurance to check on coverage,( you should not get it within 1 month of other vaccines) , then call us for a prescription  for it to take to a pharmacy that gives the shot or get it here Keep watching diet for sugar and fat Avoid red meat/ fried foods/ egg yolks/ fatty breakfast meats/ butter, cheese and high fat dairy/ and shellfish   Stay active - at least 20-30 minutes per day of exercise  Schedule annual exam in 6 months with labs prior

## 2011-01-28 NOTE — Assessment & Plan Note (Signed)
This has been well controlled with lipitor and diet  Disc goals for lipids and reasons to control them Rev labs with pt from last time 6 mo lab today Rev low sat fat diet in detail

## 2011-01-28 NOTE — Progress Notes (Signed)
Subjective:    Patient ID: Candace Newman, female    DOB: 1940/11/10, 71 y.o.   MRN: [id number]  HPI Here for f/u of DM and HTN and lipids  Is feeling fine  Needs flu shot today   Nothing new going on   Wt is stable with bmi of 22  bp is 128/80    Today No cp or palpitations or headaches or edema  No side effects to medicines   On ace   DM Lab Results  Component Value Date   HGBA1C 6.3 07/23/2010   due for labs Is still eating a healthy diet -- but was worse at the holidays- has her moments for sweets  No excessive thirst or urination  On metformin - no problems  opthy nl 8/12- vision good after cataract surgery and glaucoma surgery- is doing great! On ace   Lipids- on lipitor and diet Lab Results  Component Value Date   CHOL 126 01/02/2010   CHOL 121 07/05/2009   CHOL 124 01/30/2009   Lab Results  Component Value Date   HDL 44.30 01/02/2010   HDL 40.70 07/05/2009   HDL 40.98 01/30/2009   Lab Results  Component Value Date   LDLCALC 64 01/02/2010   LDLCALC 43 07/05/2009   LDLCALC 56 01/30/2009   Lab Results  Component Value Date   TRIG 91.0 01/02/2010   TRIG 186.0* 07/05/2009   TRIG 118.0 01/30/2009   Lab Results  Component Value Date   CHOLHDL 3 01/02/2010   CHOLHDL 3 07/05/2009   CHOLHDL 3 01/30/2009   No results found for this basename: LDLDIRECT   does watch out for fatty foods and fried foods Does however eat occ red meat  Not a big meat eater in general  Really likes fish   For exercise - she does calethenics and stretching , and likes to walk for cardiac conditioning   Patient Active Problem List  Diagnoses  . DIABETES MELLITUS, TYPE II  . HYPERLIPIDEMIA  . GLAUCOMA  . HYPERTENSION  . POSTMENOPAUSAL STATUS  . OSTEOPENIA   Past Medical History  Diagnosis Date  . Diabetes mellitus     type II  . Hyperlipidemia   . Hypertension   . Osteopenia    Past Surgical History  Procedure Date  . Abdominal hysterectomy     ovaries intact -non  cancer done for bleeding   History  Substance Use Topics  . Smoking status: Former Smoker    Quit date: 01/07/2005  . Smokeless tobacco: Not on file  . Alcohol Use:    Family History  Problem Relation Age of Onset  . Heart disease Mother     CAD  . Hypertension Mother   . Heart disease Father     CAD  . Diabetes Brother   . Cancer Brother     lymph node CA   No Known Allergies Current Outpatient Prescriptions on File Prior to Visit  Medication Sig Dispense Refill  . glucose blood (ONE TOUCH ULTRA TEST) test strip Use as instructed  100 each  12  . glucose monitoring kit (FREESTYLE) monitoring kit Check blood sugar twice a day and as needed for new DM 250.0       . Lancets (ONETOUCH ULTRASOFT) lancets Use as instructed  100 each  12  . latanoprost (XALATAN) 0.005 % ophthalmic solution Use eye drops as directed.       . Multiple Vitamin (MULTIVITAMIN) tablet Take 1 tablet by mouth daily.        Marland Kitchen  timolol (BETIMOL) 0.5 % ophthalmic solution Use eye drops as directed.            Review of Systems Review of Systems  Constitutional: Negative for fever, appetite change, fatigue and unexpected weight change.  Eyes: Negative for pain and visual disturbance.  Respiratory: Negative for cough and shortness of breath.   Cardiovascular: Negative for cp or palpitations    Gastrointestinal: Negative for nausea, diarrhea and constipation.  Genitourinary: Negative for urgency and frequency.  Skin: Negative for pallor or rash   Neurological: Negative for weakness, light-headedness, numbness and headaches.  Hematological: Negative for adenopathy. Does not bruise/bleed easily.  Psychiatric/Behavioral: Negative for dysphoric mood. The patient is not nervous/anxious.         Objective:   Physical Exam  Constitutional: She appears well-developed and well-nourished. No distress.  HENT:  Head: Normocephalic and atraumatic.  Mouth/Throat: Oropharynx is clear and moist.  Eyes: Conjunctivae  and EOM are normal. Pupils are equal, round, and reactive to light. No scleral icterus.  Neck: Normal range of motion. Neck supple. No JVD present. Carotid bruit is not present. Erythema present. No thyromegaly present.  Cardiovascular: Normal rate, regular rhythm, normal heart sounds and intact distal pulses.  Exam reveals no gallop.   Pulmonary/Chest: Effort normal and breath sounds normal. No respiratory distress. She has no wheezes.  Abdominal: Soft. Bowel sounds are normal. She exhibits no distension, no abdominal bruit and no mass. There is no tenderness.  Musculoskeletal: Normal range of motion. She exhibits no edema and no tenderness.  Lymphadenopathy:    She has no cervical adenopathy.  Neurological: She has normal reflexes. No cranial nerve deficit. She exhibits normal muscle tone. Coordination normal.  Skin: Skin is warm and dry. No rash noted. No erythema. No pallor.  Psychiatric: She has a normal mood and affect.          Assessment & Plan:

## 2011-01-28 NOTE — Assessment & Plan Note (Signed)
bp in fair control at this time  No changes needed  Disc lifstyle change with low sodium diet and exercise   Continue ace and ziac and extra hctz Chem today F/u 6 mo

## 2011-02-05 ENCOUNTER — Other Ambulatory Visit: Payer: Self-pay | Admitting: Family Medicine

## 2011-02-05 DIAGNOSIS — Z1231 Encounter for screening mammogram for malignant neoplasm of breast: Secondary | ICD-10-CM

## 2011-02-07 ENCOUNTER — Other Ambulatory Visit: Payer: Self-pay | Admitting: Family Medicine

## 2011-02-07 DIAGNOSIS — E871 Hypo-osmolality and hyponatremia: Secondary | ICD-10-CM

## 2011-02-07 NOTE — Progress Notes (Signed)
Pts med list updated as instructed from lab result note HCTZ 25 mg pt taking 1/2 daily.

## 2011-02-07 NOTE — Progress Notes (Signed)
Addended by: Patience Musca on: 02/07/2011 09:04 AM   Modules accepted: Orders

## 2011-02-19 ENCOUNTER — Ambulatory Visit (INDEPENDENT_AMBULATORY_CARE_PROVIDER_SITE_OTHER): Payer: Medicare Other | Admitting: Family Medicine

## 2011-02-19 ENCOUNTER — Encounter: Payer: Self-pay | Admitting: Family Medicine

## 2011-02-19 DIAGNOSIS — E871 Hypo-osmolality and hyponatremia: Secondary | ICD-10-CM | POA: Insufficient documentation

## 2011-02-19 DIAGNOSIS — I10 Essential (primary) hypertension: Secondary | ICD-10-CM

## 2011-02-19 MED ORDER — SPIRONOLACTONE 25 MG PO TABS: 25.0000 mg | ORAL_TABLET | Freq: Every day | ORAL | Status: DC

## 2011-02-19 NOTE — Patient Instructions (Signed)
Stop the hctz (hydrodiuril)  Start sprironolactone 25 mg each am -- it is a different diuretic blood pressure medicine If any side effects let me know  Follow up in 2-4 weeks - for visit with me and labs  Stay active

## 2011-02-19 NOTE — Assessment & Plan Note (Signed)
Very mild with na of 133  when on over 25 mg of hctz  Did change to spironolactone No symptoms - but when med cut in 1/2 bp went up significantly Will have visit and lab in 2-4 weeks

## 2011-02-19 NOTE — Progress Notes (Signed)
Subjective:    Patient ID: Candace Newman, female    DOB: 10/01/1940, 71 y.o.   MRN: [id number]  HPI Here for concerns about medications Sodium level was low -- mildly low    Chemistry      Component Value Date/Time   NA 133* 01/28/2011 1441   K 4.1 01/28/2011 1441   CL 95* 01/28/2011 1441   CO2 29 01/28/2011 1441   BUN 9 01/28/2011 1441   CREATININE 0.6 01/28/2011 1441      Component Value Date/Time   CALCIUM 9.2 01/28/2011 1441   ALKPHOS 44 01/28/2011 1441   AST 15 01/28/2011 1441   ALT 12 01/28/2011 1441   BILITOT 0.5 01/28/2011 1441      Told to cut her hydrodiuril in 1/2  bp is 158/90- high for her Has never been on other fluid pills before  Not on any K   Only gets edema if traveling - otherwise no problems   Wt is stable with bmi of 22   Patient Active Problem List  Diagnoses  . DIABETES MELLITUS, TYPE II  . HYPERLIPIDEMIA  . GLAUCOMA  . HYPERTENSION  . POSTMENOPAUSAL STATUS  . OSTEOPENIA  . Hyponatremia   Past Medical History  Diagnosis Date  . Diabetes mellitus     type II  . Hyperlipidemia   . Hypertension   . Osteopenia    Past Surgical History  Procedure Date  . Abdominal hysterectomy     ovaries intact -non cancer done for bleeding   History  Substance Use Topics  . Smoking status: Former Smoker    Quit date: 01/07/2005  . Smokeless tobacco: Not on file  . Alcohol Use:    Family History  Problem Relation Age of Onset  . Heart disease Mother     CAD  . Hypertension Mother   . Heart disease Father     CAD  . Diabetes Brother   . Cancer Brother     lymph node CA   No Known Allergies Current Outpatient Prescriptions on File Prior to Visit  Medication Sig Dispense Refill  . alendronate (FOSAMAX) 70 MG tablet Take 1 tablet (70 mg total) by mouth every 7 (seven) days. Take with a full glass of water on an empty stomach.  4 tablet  6  . atorvastatin (LIPITOR) 20 MG tablet Take 1 tablet (20 mg total) by mouth daily.  30 tablet  6  .  bisoprolol-hydrochlorothiazide (ZIAC) 5-6.25 MG per tablet Take 1 tablet by mouth daily.  30 tablet  6  . glucose blood (ONE TOUCH ULTRA TEST) test strip Use as instructed  100 each  12  . glucose monitoring kit (FREESTYLE) monitoring kit Check blood sugar twice a day and as needed for new DM 250.0       . Lancets (ONETOUCH ULTRASOFT) lancets Use as instructed  100 each  12  . latanoprost (XALATAN) 0.005 % ophthalmic solution Use eye drops as directed.       . metFORMIN (GLUCOPHAGE) 500 MG tablet Take 1 tablet (500 mg total) by mouth 2 (two) times daily with a meal.  60 tablet  6  . Multiple Vitamin (MULTIVITAMIN) tablet Take 1 tablet by mouth daily.        . ramipril (ALTACE) 5 MG capsule Take 1 capsule (5 mg total) by mouth daily.  30 capsule  11  . timolol (BETIMOL) 0.5 % ophthalmic solution Use eye drops as directed.          Review  of Systems Review of Systems  Constitutional: Negative for fever, appetite change, fatigue and unexpected weight change.  Eyes: Negative for pain and visual disturbance.  Respiratory: Negative for cough and shortness of breath.   Cardiovascular: Negative for cp or palpitations    Gastrointestinal: Negative for nausea, diarrhea and constipation.  Genitourinary: Negative for urgency and frequency.  Skin: Negative for pallor or rash   Neurological: Negative for weakness, light-headedness, numbness and headaches.  Hematological: Negative for adenopathy. Does not bruise/bleed easily.  Psychiatric/Behavioral: Negative for dysphoric mood. The patient is not nervous/anxious.          Objective:   Physical Exam  Constitutional: She appears well-developed and well-nourished. No distress.  HENT:  Head: Normocephalic and atraumatic.  Mouth/Throat: Oropharynx is clear and moist.  Eyes: Conjunctivae and EOM are normal. Pupils are equal, round, and reactive to light. No scleral icterus.  Neck: Normal range of motion. Neck supple. No JVD present. Carotid bruit is  not present. No thyromegaly present.  Cardiovascular: Normal rate, regular rhythm, normal heart sounds and intact distal pulses.   Musculoskeletal: She exhibits no edema and no tenderness.  Lymphadenopathy:    She has no cervical adenopathy.  Neurological: She is alert. She has normal reflexes.  Skin: Skin is warm and dry. No rash noted.  Psychiatric: She has a normal mood and affect.          Assessment & Plan:

## 2011-02-19 NOTE — Assessment & Plan Note (Signed)
After cutting dose of hctz for hyponatremia bp is up significantly  Will change to spironolactone - with close monitoring  Disc pot for inc K  F/u 2-4 wk for bp and labs Update if any side eff or problems

## 2011-02-25 ENCOUNTER — Ambulatory Visit
Admission: RE | Admit: 2011-02-25 | Discharge: 2011-02-25 | Disposition: A | Discharge status: Home / Self Care | Payer: BC Managed Care – PPO | Source: Ambulatory Visit | Attending: Family Medicine | Admitting: Family Medicine

## 2011-02-25 DIAGNOSIS — Z1231 Encounter for screening mammogram for malignant neoplasm of breast: Secondary | ICD-10-CM

## 2011-02-26 ENCOUNTER — Other Ambulatory Visit (INDEPENDENT_AMBULATORY_CARE_PROVIDER_SITE_OTHER): Payer: BC Managed Care – PPO

## 2011-02-26 DIAGNOSIS — E871 Hypo-osmolality and hyponatremia: Secondary | ICD-10-CM

## 2011-02-26 LAB — SODIUM: Sodium: 138 meq/L (ref 135–145)

## 2011-03-12 ENCOUNTER — Telehealth: Payer: Self-pay | Admitting: Family Medicine

## 2011-03-12 DIAGNOSIS — E119 Type 2 diabetes mellitus without complications: Secondary | ICD-10-CM

## 2011-03-12 DIAGNOSIS — M949 Disorder of cartilage, unspecified: Secondary | ICD-10-CM

## 2011-03-12 DIAGNOSIS — I10 Essential (primary) hypertension: Secondary | ICD-10-CM

## 2011-03-12 DIAGNOSIS — E785 Hyperlipidemia, unspecified: Secondary | ICD-10-CM

## 2011-03-12 NOTE — Telephone Encounter (Signed)
Message copied by Judy Pimple on Tue Mar 12, 2011  8:45 PM ------      Message from: Baldomero Lamy      Created: Thu Mar 07, 2011  8:54 AM      Regarding: Cpx labs Wed 03/13/11       Please order  future cpx labs for pt's upcomming lab appt.      Thanks      Rodney Booze

## 2011-03-13 ENCOUNTER — Other Ambulatory Visit (INDEPENDENT_AMBULATORY_CARE_PROVIDER_SITE_OTHER): Payer: BC Managed Care – PPO

## 2011-03-13 DIAGNOSIS — I10 Essential (primary) hypertension: Secondary | ICD-10-CM

## 2011-03-13 DIAGNOSIS — E119 Type 2 diabetes mellitus without complications: Secondary | ICD-10-CM

## 2011-03-13 DIAGNOSIS — M899 Disorder of bone, unspecified: Secondary | ICD-10-CM

## 2011-03-13 DIAGNOSIS — E785 Hyperlipidemia, unspecified: Secondary | ICD-10-CM

## 2011-03-13 LAB — CBC WITH DIFFERENTIAL/PLATELET
Basophils Absolute: 0.1 10*3/uL (ref 0.0–0.1)
Basophils Relative: 0.9 % (ref 0.0–3.0)
Eosinophils Absolute: 0.1 10*3/uL (ref 0.0–0.7)
Hemoglobin: 14.9 g/dL (ref 12.0–15.0)
Lymphocytes Relative: 17.2 % (ref 12.0–46.0)
Monocytes Relative: 7.4 % (ref 3.0–12.0)
Neutro Abs: 4.8 10*3/uL (ref 1.4–7.7)
Neutrophils Relative %: 73.1 % (ref 43.0–77.0)
RBC: 4.76 Mil/uL (ref 3.87–5.11)

## 2011-03-13 LAB — LIPID PANEL
LDL Cholesterol: 58 mg/dL (ref 0–99)
Total CHOL/HDL Ratio: 3
Triglycerides: 98 mg/dL (ref 0.0–149.0)

## 2011-03-13 LAB — COMPREHENSIVE METABOLIC PANEL
ALT: 13 U/L (ref 0–35)
AST: 14 U/L (ref 0–37)
Albumin: 4.5 g/dL (ref 3.5–5.2)
BUN: 13 mg/dL (ref 6–23)
CO2: 30 mEq/L (ref 19–32)
Calcium: 9.9 mg/dL (ref 8.4–10.5)
Chloride: 100 mEq/L (ref 96–112)
Creatinine, Ser: 0.7 mg/dL (ref 0.4–1.2)
GFR: 90.64 mL/min (ref >60.00)
Potassium: 4.7 mEq/L (ref 3.5–5.1)

## 2011-03-13 LAB — TSH: TSH: 2.69 u[IU]/mL (ref 0.35–5.50)

## 2011-03-14 LAB — VITAMIN D 25 HYDROXY (VIT D DEFICIENCY, FRACTURES): Vit D, 25-Hydroxy: 26 ng/mL — ABNORMAL LOW (ref 30–89)

## 2011-03-20 ENCOUNTER — Ambulatory Visit (INDEPENDENT_AMBULATORY_CARE_PROVIDER_SITE_OTHER): Payer: BC Managed Care – PPO | Admitting: Family Medicine

## 2011-03-20 ENCOUNTER — Encounter: Payer: Self-pay | Admitting: Family Medicine

## 2011-03-20 VITALS — BP 135/82 | HR 76 | Temp 98.3°F | Ht 61.0 in | Wt 119.8 lb

## 2011-03-20 DIAGNOSIS — I10 Essential (primary) hypertension: Secondary | ICD-10-CM

## 2011-03-20 DIAGNOSIS — E785 Hyperlipidemia, unspecified: Secondary | ICD-10-CM

## 2011-03-20 DIAGNOSIS — E119 Type 2 diabetes mellitus without complications: Secondary | ICD-10-CM

## 2011-03-20 DIAGNOSIS — E871 Hypo-osmolality and hyponatremia: Secondary | ICD-10-CM

## 2011-03-20 NOTE — Assessment & Plan Note (Signed)
Great control with lipitor and diet Disc goals for lipids and reasons to control them Rev labs with pt Rev low sat fat diet in detail

## 2011-03-20 NOTE — Assessment & Plan Note (Signed)
Better 2nd check 135/82 and pt also checks at home Continue current meds Spironolactone works ok Rev last lab F/u 6 mo

## 2011-03-20 NOTE — Assessment & Plan Note (Signed)
Resolved off hctz Spironolactone is causing no problems Will continue to monitor

## 2011-03-20 NOTE — Progress Notes (Signed)
Subjective:    Patient ID: Candace Newman, female    DOB: 1940-05-08, 71 y.o.   MRN: [id number]  HPI Here for f/u of chronic conditions Is feeling good   Hyponatremia   Chemistry      Component Value Date/Time   NA 137 03/13/2011 0848   K 4.7 03/13/2011 0848   CL 100 03/13/2011 0848   CO2 30 03/13/2011 0848   BUN 13 03/13/2011 0848   CREATININE 0.7 03/13/2011 0848      Component Value Date/Time   CALCIUM 9.9 03/13/2011 0848   ALKPHOS 41 03/13/2011 0848   AST 14 03/13/2011 0848   ALT 13 03/13/2011 0848   BILITOT 0.7 03/13/2011 0848     normal on 3/6 Changed from hctz to spironolactone   bp is 158/94    Today bp is fine at home - just high here  No cp or palpitations or headaches or edema  No side effects to medicines    bmi is stable at 22  Vit D 26- a bit low Takes ca and D together   Diabetes Home sugar results -are very good  DM diet - eats healthy most of the time / lot of vegetables and lean meats  Exercise -- is good / a walker  Symptoms-- none at all  A1C last great at 6.2 No problems with medications -on metformin  Renal protection- on ace  Last eye exam -- (had eye surgery) - last nl exam jan   Lab Results  Component Value Date   CHOL 127 03/13/2011   HDL 49.10 03/13/2011   LDLCALC 58 03/13/2011   TRIG 98.0 03/13/2011   CHOLHDL 3 03/13/2011   with lipitor and diet   Patient Active Problem List  Diagnoses  . DIABETES MELLITUS, TYPE II  . HYPERLIPIDEMIA  . GLAUCOMA  . HYPERTENSION  . POSTMENOPAUSAL STATUS  . OSTEOPENIA  . Hyponatremia   Past Medical History  Diagnosis Date  . Diabetes mellitus     type II  . Hyperlipidemia   . Hypertension   . Osteopenia    Past Surgical History  Procedure Date  . Abdominal hysterectomy     ovaries intact -non cancer done for bleeding   History  Substance Use Topics  . Smoking status: Former Smoker    Quit date: 01/07/2005  . Smokeless tobacco: Not on file  . Alcohol Use:    Family History  Problem Relation Age of  Onset  . Heart disease Mother     CAD  . Hypertension Mother   . Heart disease Father     CAD  . Diabetes Brother   . Cancer Brother     lymph node CA   No Known Allergies Current Outpatient Prescriptions on File Prior to Visit  Medication Sig Dispense Refill  . alendronate (FOSAMAX) 70 MG tablet Take 1 tablet (70 mg total) by mouth every 7 (seven) days. Take with a full glass of water on an empty stomach.  4 tablet  6  . atorvastatin (LIPITOR) 20 MG tablet Take 1 tablet (20 mg total) by mouth daily.  30 tablet  6  . bisoprolol-hydrochlorothiazide (ZIAC) 5-6.25 MG per tablet Take 1 tablet by mouth daily.  30 tablet  6  . glucose blood (ONE TOUCH ULTRA TEST) test strip Use as instructed  100 each  12  . glucose monitoring kit (FREESTYLE) monitoring kit Check blood sugar twice a day and as needed for new DM 250.0       .  Lancets (ONETOUCH ULTRASOFT) lancets Use as instructed  100 each  12  . latanoprost (XALATAN) 0.005 % ophthalmic solution Use eye drops as directed.       . metFORMIN (GLUCOPHAGE) 500 MG tablet Take 1 tablet (500 mg total) by mouth 2 (two) times daily with a meal.  60 tablet  6  . Multiple Vitamin (MULTIVITAMIN) tablet Take 1 tablet by mouth daily.        . ramipril (ALTACE) 5 MG capsule Take 1 capsule (5 mg total) by mouth daily.  30 capsule  11  . spironolactone (ALDACTONE) 25 MG tablet Take 1 tablet (25 mg total) by mouth daily.  30 tablet  3  . timolol (BETIMOL) 0.5 % ophthalmic solution Use eye drops as directed.            Review of Systems Review of Systems  Constitutional: Negative for fever, appetite change, fatigue and unexpected weight change.  Eyes: Negative for pain and visual disturbance.  Respiratory: Negative for cough and shortness of breath.   Cardiovascular: Negative for cp or palpitations    Gastrointestinal: Negative for nausea, diarrhea and constipation.  Genitourinary: Negative for urgency and frequency.  Skin: Negative for pallor or rash     Neurological: Negative for weakness, light-headedness, numbness and headaches.  Hematological: Negative for adenopathy. Does not bruise/bleed easily.  Psychiatric/Behavioral: Negative for dysphoric mood. The patient is not nervous/anxious.          Objective:   Physical Exam  Constitutional: She appears well-developed and well-nourished. No distress.  HENT:  Head: Normocephalic and atraumatic.  Mouth/Throat: Oropharynx is clear and moist.  Eyes: Conjunctivae and EOM are normal. Pupils are equal, round, and reactive to light. No scleral icterus.  Neck: Normal range of motion. Neck supple. No JVD present. Carotid bruit is not present. No thyromegaly present.  Cardiovascular: Normal rate, regular rhythm, normal heart sounds and intact distal pulses.  Exam reveals no gallop.   Pulmonary/Chest: Effort normal and breath sounds normal. No respiratory distress. She has no wheezes.  Abdominal: Soft. Bowel sounds are normal. She exhibits no distension, no abdominal bruit and no mass. There is no tenderness.  Musculoskeletal: Normal range of motion. She exhibits no edema and no tenderness.  Lymphadenopathy:    She has no cervical adenopathy.  Neurological: She is alert. She has normal reflexes. No cranial nerve deficit. She exhibits normal muscle tone. Coordination normal.  Skin: Skin is warm and dry. No rash noted. No erythema. No pallor.  Psychiatric: She has a normal mood and affect.          Assessment & Plan:

## 2011-03-20 NOTE — Patient Instructions (Signed)
Take you regular calcium and vitamin D twice daily  Now add extra 2000 iu of vitamin D once daily  No other changes  Keep up the good work with healthy diet and exercise  Follow up in 6 months for annual exam with labs prior

## 2011-03-20 NOTE — Assessment & Plan Note (Signed)
Doing well  Stable a1c utd on opthy  On ace No problems Urged to keep up good habits

## 2011-04-03 ENCOUNTER — Other Ambulatory Visit: Payer: Self-pay | Admitting: Family Medicine

## 2011-04-12 ENCOUNTER — Other Ambulatory Visit: Payer: Self-pay | Admitting: Family Medicine

## 2011-05-01 ENCOUNTER — Other Ambulatory Visit: Payer: Self-pay | Admitting: Family Medicine

## 2011-05-07 ENCOUNTER — Other Ambulatory Visit: Payer: Self-pay | Admitting: Family Medicine

## 2011-06-24 ENCOUNTER — Encounter: Payer: Self-pay | Admitting: Family Medicine

## 2011-06-24 ENCOUNTER — Ambulatory Visit (INDEPENDENT_AMBULATORY_CARE_PROVIDER_SITE_OTHER): Payer: BC Managed Care – PPO | Admitting: Family Medicine

## 2011-06-24 VITALS — BP 122/78 | HR 72 | Temp 97.8°F | Ht 61.5 in | Wt 119.2 lb

## 2011-06-24 DIAGNOSIS — B9689 Other specified bacterial agents as the cause of diseases classified elsewhere: Secondary | ICD-10-CM

## 2011-06-24 DIAGNOSIS — J208 Acute bronchitis due to other specified organisms: Secondary | ICD-10-CM | POA: Insufficient documentation

## 2011-06-24 DIAGNOSIS — J209 Acute bronchitis, unspecified: Secondary | ICD-10-CM

## 2011-06-24 MED ORDER — AZITHROMYCIN 250 MG PO TABS: ORAL_TABLET | ORAL | Status: AC

## 2011-06-24 MED ORDER — ALBUTEROL SULFATE HFA 108 (90 BASE) MCG/ACT IN AERS: 2.0000 | INHALATION_SPRAY | RESPIRATORY_TRACT | Status: DC | PRN

## 2011-06-24 NOTE — Patient Instructions (Signed)
Rest and drink a lot of fluids Try mucinex DM over the counter for cough and congestion  Take the zpak as directed for bronchitis  Try the inhaler if needed for wheeze or tight chest Update if not starting to improve in a week or if worsening   Make appt with Dr Patsy Lager at check out for your knee problem

## 2011-06-24 NOTE — Progress Notes (Signed)
Subjective:    Patient ID: Candace Newman, female    DOB: [birth year], 71 y.o.   MRN: [id number]  HPI 1 week of uri symptoms - head and chest congestion with thick green mucous   No sore throat , ears are ok  Coughing up mucous  Not taking otc meds - afraid to run bp up  Is drinking lots of fluids   No wheeze or sob  No facial pain at all   Just not getting better   Her R knee is going out on her - locks up often ? OA in it   Patient Active Problem List  Diagnosis  . DIABETES MELLITUS, TYPE II  . HYPERLIPIDEMIA  . GLAUCOMA  . HYPERTENSION  . POSTMENOPAUSAL STATUS  . OSTEOPENIA  . Hyponatremia   Past Medical History  Diagnosis Date  . Diabetes mellitus     type II  . Hyperlipidemia   . Hypertension   . Osteopenia    Past Surgical History  Procedure Date  . Abdominal hysterectomy     ovaries intact -non cancer done for bleeding   History  Substance Use Topics  . Smoking status: Former Smoker    Quit date: 01/07/2005  . Smokeless tobacco: Not on file  . Alcohol Use:    Family History  Problem Relation Age of Onset  . Heart disease Mother     CAD  . Hypertension Mother   . Heart disease Father     CAD  . Diabetes Brother   . Cancer Brother     lymph node CA   No Known Allergies Current Outpatient Prescriptions on File Prior to Visit  Medication Sig Dispense Refill  . alendronate (FOSAMAX) 70 MG tablet Take 1 tablet (70 mg total) by mouth every 7 (seven) days. Take with a full glass of water on an empty stomach.  4 tablet  6  . atorvastatin (LIPITOR) 20 MG tablet Take 1 tablet (20 mg total) by mouth daily.  30 tablet  6  . bisoprolol-hydrochlorothiazide (ZIAC) 5-6.25 MG per tablet TAKE 1 TABLET BY MOUTH EVERY DAY  30 tablet  11  . glucose monitoring kit (FREESTYLE) monitoring kit Check blood sugar twice a day and as needed for new DM 250.0       . latanoprost (XALATAN) 0.005 % ophthalmic solution Use eye drops as directed.       . metFORMIN  (GLUCOPHAGE) 500 MG tablet TAKE 1 TABLET BY MOUTH TWICE A DAY WITH A MEAL  60 tablet  6  . Multiple Vitamin (MULTIVITAMIN) tablet Take 1 tablet by mouth daily.        . ONE TOUCH ULTRA TEST test strip USE AS INSTRUCTED  100 each  11  . ONETOUCH DELICA LANCETS MISC USE AS INSTRUCTED  100 each  11  . ramipril (ALTACE) 5 MG capsule Take 1 capsule (5 mg total) by mouth daily.  30 capsule  11  . spironolactone (ALDACTONE) 25 MG tablet Take 1 tablet (25 mg total) by mouth daily.  30 tablet  3  . timolol (BETIMOL) 0.5 % ophthalmic solution Use eye drops as directed.       Marland Kitchen DISCONTD: bisoprolol-hydrochlorothiazide (ZIAC) 5-6.25 MG per tablet Take 1 tablet by mouth daily.  30 tablet  6  . DISCONTD: metFORMIN (GLUCOPHAGE) 500 MG tablet Take 1 tablet (500 mg total) by mouth 2 (two) times daily with a meal.  60 tablet  6       Review of Systems Review of  Systems  Constitutional: Negative for fever, appetite change, and unexpected weight change. pos for fatigue  Eyes: Negative for pain and visual disturbance.  ENT pos for congestion in head and post nasal drip Respiratory: Negative for sob or wheeze/ pos for prod cough  Cardiovascular: Negative for cp or palpitations    Gastrointestinal: Negative for nausea, diarrhea and constipation.  Genitourinary: Negative for urgency and frequency.  Skin: Negative for pallor or rash   Neurological: Negative for weakness, light-headedness, numbness and headaches.  Hematological: Negative for adenopathy. Does not bruise/bleed easily.  Psychiatric/Behavioral: Negative for dysphoric mood. The patient is not nervous/anxious.         Objective:   Physical Exam  Constitutional: She appears well-developed and well-nourished.  HENT:  Head: Normocephalic and atraumatic.  Right Ear: External ear normal.  Left Ear: External ear normal.  Mouth/Throat: Oropharynx is clear and moist. No oropharyngeal exudate.       Nares are injected and congested  No sinus tendeness    Eyes: Conjunctivae and EOM are normal. Pupils are equal, round, and reactive to light. Right eye exhibits no discharge. Left eye exhibits no discharge.  Neck: Normal range of motion. Neck supple. No thyromegaly present.  Cardiovascular: Normal rate and regular rhythm.   Pulmonary/Chest: Effort normal. No respiratory distress. She has wheezes. She has no rales. She exhibits no tenderness.       Diffuse rhonchi throughout - imp after coughing (some upper airway cong) Wheeze on forced exp only  Lymphadenopathy:    She has no cervical adenopathy.  Neurological: She is alert.  Skin: Skin is warm and dry. No rash noted.  Psychiatric: She has a normal mood and affect.          Assessment & Plan:

## 2011-06-24 NOTE — Assessment & Plan Note (Signed)
With rhonchi and mild wheeze on exam inst in use of MDI Px zpak and also albuterol hfa Disc symptomatic care - see instructions on AVS  Update if not starting to improve in a week or if worsening

## 2011-07-01 ENCOUNTER — Encounter: Payer: Self-pay | Admitting: Family Medicine

## 2011-07-01 ENCOUNTER — Ambulatory Visit (INDEPENDENT_AMBULATORY_CARE_PROVIDER_SITE_OTHER)
Admission: RE | Admit: 2011-07-01 | Discharge: 2011-07-01 | Disposition: A | Discharge status: Home / Self Care | Payer: BC Managed Care – PPO | Source: Ambulatory Visit | Attending: Family Medicine | Admitting: Family Medicine

## 2011-07-01 ENCOUNTER — Ambulatory Visit (INDEPENDENT_AMBULATORY_CARE_PROVIDER_SITE_OTHER): Payer: BC Managed Care – PPO | Admitting: Family Medicine

## 2011-07-01 VITALS — BP 128/70 | HR 69 | Temp 99.0°F | Ht 61.5 in | Wt 119.2 lb

## 2011-07-01 DIAGNOSIS — M25561 Pain in right knee: Secondary | ICD-10-CM

## 2011-07-01 DIAGNOSIS — M1710 Unilateral primary osteoarthritis, unspecified knee: Secondary | ICD-10-CM

## 2011-07-01 DIAGNOSIS — M25569 Pain in unspecified knee: Secondary | ICD-10-CM

## 2011-07-01 NOTE — Progress Notes (Signed)
Nature conservation officer at [facility] [address] [place] [address] Phone: [phone] Fax: [phone]   Patient Name: Candace Newman Date of Birth: Apr 18, 1940 Age: 70 y.o. Medical Record Number: [id number] Gender: female Date of Encounter: 07/01/2011  History of Present Illness:  Candace Newman is a 71 y.o. very pleasant female patient who presents with the following:  Right knee pain, went [place] the [place] and thought hurting more after going then.   Historically, the patient's knees have been under pretty good shape, but right now she is having a fusion on her RIGHT knee and is having more pain than normal. She has tried some oral NSAIDs without much significant relief. She has not had any symptomatic giving way or locking up of her joint. No prior surgery. She is otherwise a fairly fit 120 pound female.  Past Medical History, Surgical History, Social History, Family History, Problem List, Medications, and Allergies have been reviewed and updated if relevant.  Prior to Admission medications   Medication Sig Start Date End Date Taking? Authorizing Provider  albuterol (PROVENTIL HFA;VENTOLIN HFA) 108 (90 BASE) MCG/ACT inhaler Inhale 2 puffs into the lungs every 4 (four) hours as needed for wheezing. 06/24/11 06/23/12 Yes Judy Pimple, MD  alendronate (FOSAMAX) 70 MG tablet Take 1 tablet (70 mg total) by mouth every 7 (seven) days. Take with a full glass of water on an empty stomach. 01/28/11  Yes Judy Pimple, MD  atorvastatin (LIPITOR) 20 MG tablet Take 1 tablet (20 mg total) by mouth daily. 01/28/11  Yes Judy Pimple, MD  bisoprolol-hydrochlorothiazide (ZIAC) 5-6.25 MG per tablet TAKE 1 TABLET BY MOUTH EVERY DAY 04/12/11  Yes Judy Pimple, MD  glucose monitoring kit (FREESTYLE) monitoring kit Check blood sugar twice a day and as needed for new DM 250.0    Yes Historical Provider, MD  latanoprost (XALATAN) 0.005 % ophthalmic solution Use eye drops as directed.    Yes  Historical Provider, MD  metFORMIN (GLUCOPHAGE) 500 MG tablet TAKE 1 TABLET BY MOUTH TWICE A DAY WITH A MEAL 04/03/11  Yes Judy Pimple, MD  Multiple Vitamin (MULTIVITAMIN) tablet Take 1 tablet by mouth daily.     Yes Historical Provider, MD  ONE TOUCH ULTRA TEST test strip USE AS INSTRUCTED 05/01/11  Yes Judy Pimple, MD  [facility] DELICA LANCETS MISC USE AS INSTRUCTED 05/07/11  Yes Judy Pimple, MD  ramipril (ALTACE) 5 MG capsule Take 1 capsule (5 mg total) by mouth daily. 01/28/11  Yes Judy Pimple, MD  spironolactone (ALDACTONE) 25 MG tablet Take 1 tablet (25 mg total) by mouth daily. 02/19/11 02/19/12 Yes Marne A [name], MD  timolol (BETIMOL) 0.5 % ophthalmic solution Use eye drops as directed.    Yes Historical Provider, MD    Review of Systems:  GEN: No fevers, chills. Nontoxic. Primarily MSK c/o today. MSK: Detailed in the HPI GI: tolerating PO intake without difficulty Neuro: No numbness, parasthesias, or tingling associated. Otherwise the pertinent positives of the ROS are noted above.    Physical Examination: Filed Vitals:   07/01/11 1023  BP: 128/70  Pulse: 69  Temp: 99 F (37.2 C)   Filed Vitals:   07/01/11 1023  Height: 5' 1.5" (1.562 m)  Weight: 119 lb 4 oz (54.091 kg)   Body mass index is 22.17 kg/(m^2). Ideal Body Weight: Weight in (lb) to have BMI = 25: 134.2    GEN: WDWN, NAD, Non-toxic, Alert & Oriented x 3  HEENT: Atraumatic, Normocephalic.  Ears and Nose: No external deformity. EXTR: No clubbing/cyanosis/edema NEURO: mildly antalgic gait.  PSYCH: Normally interactive. Conversant. Not depressed or anxious appearing.  Calm demeanor.   RIGHT knee: Mild effusion. Full extension, flexion to 120. Moderate tenderness with patellar manipulation of the lateral and medial patellar facets. Positive grind maneuver with significant crepitus. Mild medial joint line tenderness. There is a popliteal fullness present. Stable to varus and valgus stress. Negative Lachman.  Negative anterior and posterior drawer testing. Flexion pinch is positive. Mcmurray's is negative. Bounce home test is negative.  Assessment and Plan: 1. Right knee pain  DG Knee Bilateral Standing AP, DG Knee 1-2 Views Right  2. Arthritis of knee      X-rays: AP Bilateral Weight-bearing, Weightbearing Lateral, Sunrise views Indication: knee pain Findings:  Minimal loss of joint space  Clinically consistent with arthritis exacerbation, patient does have crepitus. Clinically correlates greater than radiographical findings.  Cannot exclude degenerative meniscal tear.  Continue with anti-inflammatories. Reviewed some basic quadriceps strengthening.  Knee Aspiration and Injection, R Patient verbally consented; risks, benefits, and alternatives explained including possible infection. Patient prepped with Chloraprep. Ethyl chloride for anesthesia. 10 cc of 1% Lidocaine used in wheal then injected Subcutaneous fashion with 27 gauge needle on lateral approach. Under sterilne conditions, 18 gauge needle used via lateral approach to aspirate 25 cc of serosanguinous fluid. Then 9 cc of Lidocaine 1% and 1 cc of Depo-Medrol 40 mg injected. Tolerated well, decreased pain, no complications.   Hannah Beat, MD

## 2011-07-15 ENCOUNTER — Other Ambulatory Visit: Payer: Self-pay

## 2011-07-15 NOTE — Telephone Encounter (Signed)
Ok to refill 

## 2011-07-16 MED ORDER — SPIRONOLACTONE 25 MG PO TABS: 25.0000 mg | ORAL_TABLET | Freq: Every day | ORAL | Status: DC

## 2011-07-16 NOTE — Telephone Encounter (Signed)
Will refill electronically  

## 2011-07-21 ENCOUNTER — Telehealth: Payer: Self-pay | Admitting: Family Medicine

## 2011-07-21 DIAGNOSIS — E119 Type 2 diabetes mellitus without complications: Secondary | ICD-10-CM

## 2011-07-21 DIAGNOSIS — M899 Disorder of bone, unspecified: Secondary | ICD-10-CM

## 2011-07-21 DIAGNOSIS — I10 Essential (primary) hypertension: Secondary | ICD-10-CM

## 2011-07-21 DIAGNOSIS — E785 Hyperlipidemia, unspecified: Secondary | ICD-10-CM

## 2011-07-21 NOTE — Telephone Encounter (Signed)
Message copied by Judy Pimple on Sun Jul 21, 2011  9:32 PM ------      Message from: Baldomero Lamy      Created: Wed Jul 17, 2011  1:33 PM      Regarding: Cpx labs Mon 7/15       Please order  future cpx labs for pt's upcomming lab appt.      Thanks      Rodney Booze

## 2011-07-22 ENCOUNTER — Other Ambulatory Visit (INDEPENDENT_AMBULATORY_CARE_PROVIDER_SITE_OTHER): Payer: Medicare Other

## 2011-07-22 DIAGNOSIS — E119 Type 2 diabetes mellitus without complications: Secondary | ICD-10-CM

## 2011-07-22 DIAGNOSIS — I10 Essential (primary) hypertension: Secondary | ICD-10-CM

## 2011-07-22 DIAGNOSIS — E785 Hyperlipidemia, unspecified: Secondary | ICD-10-CM

## 2011-07-22 DIAGNOSIS — M949 Disorder of cartilage, unspecified: Secondary | ICD-10-CM

## 2011-07-22 LAB — LIPID PANEL
Cholesterol: 136 mg/dL (ref 0–200)
LDL Cholesterol: 61 mg/dL (ref 0–99)
Triglycerides: 105 mg/dL (ref 0.0–149.0)
VLDL: 21 mg/dL (ref 0.0–40.0)

## 2011-07-22 LAB — COMPREHENSIVE METABOLIC PANEL
AST: 13 U/L (ref 0–37)
Albumin: 4.5 g/dL (ref 3.5–5.2)
Alkaline Phosphatase: 46 U/L (ref 39–117)
BUN: 10 mg/dL (ref 6–23)
Calcium: 10.1 mg/dL (ref 8.4–10.5)
Creatinine, Ser: 0.8 mg/dL (ref 0.4–1.2)
Glucose, Bld: 150 mg/dL — ABNORMAL HIGH (ref 70–99)

## 2011-07-22 LAB — CBC WITH DIFFERENTIAL/PLATELET
Basophils Relative: 1.2 % (ref 0.0–3.0)
Eosinophils Absolute: 0.1 10*3/uL (ref 0.0–0.7)
Eosinophils Relative: 1.7 % (ref 0.0–5.0)
HCT: 43.1 % (ref 36.0–46.0)
Hemoglobin: 14.5 g/dL (ref 12.0–15.0)
MCHC: 33.6 g/dL (ref 30.0–36.0)
MCV: 92.4 fl (ref 78.0–100.0)
Monocytes Absolute: 0.5 10*3/uL (ref 0.1–1.0)
Neutro Abs: 4.8 10*3/uL (ref 1.4–7.7)
RBC: 4.67 Mil/uL (ref 3.87–5.11)
WBC: 6.7 10*3/uL (ref 4.5–10.5)

## 2011-07-22 LAB — HEMOGLOBIN A1C: Hgb A1c MFr Bld: 6.5 % (ref 4.6–6.5)

## 2011-07-23 LAB — VITAMIN D 25 HYDROXY (VIT D DEFICIENCY, FRACTURES): Vit D, 25-Hydroxy: 35 ng/mL (ref 30–89)

## 2011-07-29 ENCOUNTER — Ambulatory Visit (INDEPENDENT_AMBULATORY_CARE_PROVIDER_SITE_OTHER): Payer: Medicare Other | Admitting: Family Medicine

## 2011-07-29 ENCOUNTER — Encounter: Payer: Self-pay | Admitting: Family Medicine

## 2011-07-29 VITALS — BP 120/78 | HR 64 | Temp 97.6°F | Ht 63.0 in | Wt 118.2 lb

## 2011-07-29 DIAGNOSIS — M8580 Other specified disorders of bone density and structure, unspecified site: Secondary | ICD-10-CM

## 2011-07-29 DIAGNOSIS — E119 Type 2 diabetes mellitus without complications: Secondary | ICD-10-CM

## 2011-07-29 DIAGNOSIS — E785 Hyperlipidemia, unspecified: Secondary | ICD-10-CM

## 2011-07-29 DIAGNOSIS — M899 Disorder of bone, unspecified: Secondary | ICD-10-CM

## 2011-07-29 DIAGNOSIS — I10 Essential (primary) hypertension: Secondary | ICD-10-CM

## 2011-07-29 LAB — HM DIABETES EYE EXAM

## 2011-07-29 NOTE — Assessment & Plan Note (Signed)
Due dexa on fosamax On ca and D Active No fractures  She wants to put off test till fall or winter and will call for ref when ready

## 2011-07-29 NOTE — Patient Instructions (Addendum)
If you are interested in a shingles/zoster vaccine - call your insurance to check on coverage,( you should not get it within 1 month of other vaccines) , then call us for a prescription  for it to take to a pharmacy that gives the shot   you are due for a bone density test this summer - so call us when you are ready to schedule it  Keep eating a low sugar healthy diet and stay active No change in your medicines  Follow up in 6 months with labs prior

## 2011-07-29 NOTE — Assessment & Plan Note (Signed)
Fairly stable on metformin with a1c 6.5 and no hypoglycemia Disc low glycemic diet and need to stay active and get enough protein

## 2011-07-29 NOTE — Assessment & Plan Note (Signed)
Lipids are very well controlled on lipitor and diet Disc goals for lipids and reasons to control them Rev labs with pt Rev low sat fat diet in detail  F/u 6 mo

## 2011-07-29 NOTE — Assessment & Plan Note (Signed)
bp in fair control at this time  No changes needed  Disc lifstyle change with low sodium diet and exercise   Reviewed labs in detail

## 2011-07-29 NOTE — Progress Notes (Signed)
Subjective:    Patient ID: Candace Newman, female    DOB: July 06, 1940, 71 y.o.   MRN: [id number]  HPI Here for check up of chronic medical conditions and to review health mt list   Feeling ok overall  Nothing new going on  Chronic knee pain - especially with the humidity   bp is  good   Today BP Readings from Last 3 Encounters:  07/29/11 120/78  07/01/11 128/70  06/24/11 122/78    No cp or palpitations or headaches or edema  No side effects to medicines    Wt is stable with bmi of 20  Diabetes Home sugar results - checks every day am/ pm- very good low 100s at most  DM diet -- is overall fairly good  Exercise - not as much due to the rain  Symptoms--none  A1C last 6.5 up from 6.2- overall stable  No problems with medications  Renal protection-on ace  Last eye exam -- had that today- perfect!  Lipids-on lipitor Lab Results  Component Value Date   CHOL 136 07/22/2011   CHOL 127 03/13/2011   CHOL 115 01/28/2011   Lab Results  Component Value Date   HDL 54.00 07/22/2011   HDL 14.78 03/13/2011   HDL 52.90 01/28/2011   Lab Results  Component Value Date   LDLCALC 61 07/22/2011   LDLCALC 58 03/13/2011   LDLCALC 32 01/28/2011   Lab Results  Component Value Date   TRIG 105.0 07/22/2011   TRIG 98.0 03/13/2011   TRIG 149.0 01/28/2011   Lab Results  Component Value Date   CHOLHDL 3 07/22/2011   CHOLHDL 3 03/13/2011   CHOLHDL 2 01/28/2011   No results found for this basename: LDLDIRECT   diet- fair overall   Osteopenia-on fosamax- about 2 years  Due for 2 year dexa-- wants to set up in the fall or winter  Ca and D- does well with   Zoster status- is not interested in the vaccine- she has had it twice   mammo 2/13 Self exam- no lumps or changes   Has had hysterectomy No symptoms or problems   colonosc 1/08-- unsure when recall is    Patient Active Problem List  Diagnosis  . DIABETES MELLITUS, TYPE II  . HYPERLIPIDEMIA  . GLAUCOMA  . HYPERTENSION  . POSTMENOPAUSAL  STATUS  . OSTEOPENIA  . Hyponatremia  . Osteopenia   Past Medical History  Diagnosis Date  . Diabetes mellitus     type II  . Hyperlipidemia   . Hypertension   . Osteopenia    Past Surgical History  Procedure Date  . Abdominal hysterectomy     ovaries intact -non cancer done for bleeding   History  Substance Use Topics  . Smoking status: Former Smoker    Quit date: 01/07/2005  . Smokeless tobacco: Not on file  . Alcohol Use: No   Family History  Problem Relation Age of Onset  . Heart disease Mother     CAD  . Hypertension Mother   . Heart disease Father     CAD  . Diabetes Brother   . Cancer Brother     lymph node CA   No Known Allergies Current Outpatient Prescriptions on File Prior to Visit  Medication Sig Dispense Refill  . albuterol (PROVENTIL HFA;VENTOLIN HFA) 108 (90 BASE) MCG/ACT inhaler Inhale 2 puffs into the lungs every 4 (four) hours as needed for wheezing.  1 Inhaler  0  . alendronate (FOSAMAX) 70 MG tablet  Take 1 tablet (70 mg total) by mouth every 7 (seven) days. Take with a full glass of water on an empty stomach.  4 tablet  6  . atorvastatin (LIPITOR) 20 MG tablet Take 1 tablet (20 mg total) by mouth daily.  30 tablet  6  . bisoprolol-hydrochlorothiazide (ZIAC) 5-6.25 MG per tablet TAKE 1 TABLET BY MOUTH EVERY DAY  30 tablet  11  . glucose monitoring kit (FREESTYLE) monitoring kit Check blood sugar twice a day and as needed for new DM 250.0       . latanoprost (XALATAN) 0.005 % ophthalmic solution Use eye drops as directed.       . metFORMIN (GLUCOPHAGE) 500 MG tablet TAKE 1 TABLET BY MOUTH TWICE A DAY WITH A MEAL  60 tablet  6  . Multiple Vitamin (MULTIVITAMIN) tablet Take 1 tablet by mouth daily.        . ONE TOUCH ULTRA TEST test strip USE AS INSTRUCTED  100 each  11  . ONETOUCH DELICA LANCETS MISC USE AS INSTRUCTED  100 each  11  . ramipril (ALTACE) 5 MG capsule Take 1 capsule (5 mg total) by mouth daily.  30 capsule  11  . spironolactone  (ALDACTONE) 25 MG tablet Take 1 tablet (25 mg total) by mouth daily.  30 tablet  11  . timolol (BETIMOL) 0.5 % ophthalmic solution Use eye drops as directed.           Review of Systems Review of Systems  Constitutional: Negative for fever, appetite change, fatigue and unexpected weight change.  Eyes: Negative for pain and visual disturbance.  Respiratory: Negative for cough and shortness of breath.   Cardiovascular: Negative for cp or palpitations    Gastrointestinal: Negative for nausea, diarrhea and constipation.  Genitourinary: Negative for urgency and frequency.  Skin: Negative for pallor or rash   MSK pos for chronic knee arthritis pain Neurological: Negative for weakness, light-headedness, numbness and headaches.  Hematological: Negative for adenopathy. Does not bruise/bleed easily.  Psychiatric/Behavioral: Negative for dysphoric mood. The patient is not nervous/anxious.         Objective:   Physical Exam  Constitutional: She appears well-developed and well-nourished. No distress.  HENT:  Head: Normocephalic and atraumatic.  Mouth/Throat: Oropharynx is clear and moist.  Eyes: Conjunctivae and EOM are normal. Pupils are equal, round, and reactive to light. No scleral icterus.  Neck: Normal range of motion. Neck supple. No JVD present. Carotid bruit is not present. No thyromegaly present.  Cardiovascular: Normal rate, regular rhythm, normal heart sounds and intact distal pulses.  Exam reveals no gallop.   Pulmonary/Chest: Effort normal and breath sounds normal. No respiratory distress. She has no wheezes.  Abdominal: Soft. Bowel sounds are normal. She exhibits no distension, no abdominal bruit and no mass. There is no tenderness.  Genitourinary: No breast swelling, tenderness, discharge or bleeding.       Breast exam: No mass, nodules, thickening, tenderness, bulging, retraction, inflamation, nipple discharge or skin changes noted.  No axillary or clavicular LA.  Chaperoned  exam.    Musculoskeletal: She exhibits no edema and no tenderness.  Lymphadenopathy:    She has no cervical adenopathy.  Neurological: She is alert. She has normal reflexes. No cranial nerve deficit. She exhibits normal muscle tone. Coordination normal.  Skin: Skin is warm and dry. No rash noted. No erythema. No pallor.  Psychiatric: She has a normal mood and affect.          Assessment & Plan:

## 2011-10-11 ENCOUNTER — Encounter: Payer: Self-pay | Admitting: Gastroenterology

## 2011-11-04 ENCOUNTER — Other Ambulatory Visit: Payer: Self-pay | Admitting: *Deleted

## 2011-11-04 MED ORDER — ATORVASTATIN CALCIUM 20 MG PO TABS: 20.0000 mg | ORAL_TABLET | Freq: Every day | ORAL | Status: DC

## 2011-11-12 ENCOUNTER — Other Ambulatory Visit: Payer: Self-pay | Admitting: *Deleted

## 2011-11-12 MED ORDER — ALENDRONATE SODIUM 70 MG PO TABS: 70.0000 mg | ORAL_TABLET | ORAL | Status: DC

## 2011-12-04 ENCOUNTER — Other Ambulatory Visit: Payer: Self-pay | Admitting: Family Medicine

## 2012-02-10 ENCOUNTER — Other Ambulatory Visit: Payer: Self-pay | Admitting: Family Medicine

## 2012-02-10 DIAGNOSIS — Z1231 Encounter for screening mammogram for malignant neoplasm of breast: Secondary | ICD-10-CM

## 2012-02-25 ENCOUNTER — Other Ambulatory Visit: Payer: Self-pay | Admitting: Family Medicine

## 2012-02-25 MED ORDER — RAMIPRIL 5 MG PO CAPS: 5.0000 mg | ORAL_CAPSULE | Freq: Every day | ORAL | Status: DC

## 2012-02-28 ENCOUNTER — Ambulatory Visit
Admission: RE | Admit: 2012-02-28 | Discharge: 2012-02-28 | Disposition: A | Discharge status: Home / Self Care | Payer: 59 | Source: Ambulatory Visit | Attending: Family Medicine | Admitting: Family Medicine

## 2012-02-28 DIAGNOSIS — Z1231 Encounter for screening mammogram for malignant neoplasm of breast: Secondary | ICD-10-CM

## 2012-03-03 ENCOUNTER — Encounter: Payer: Self-pay | Admitting: *Deleted

## 2012-04-12 ENCOUNTER — Other Ambulatory Visit: Payer: Self-pay | Admitting: Family Medicine

## 2012-04-13 NOTE — Telephone Encounter (Signed)
appt scheduled for 07/14/12 and med refilled for 6 months

## 2012-04-13 NOTE — Telephone Encounter (Signed)
Please give her 6 months of refils - and she needs to f/u with me in July please

## 2012-04-13 NOTE — Telephone Encounter (Signed)
Ok to refill? No recent appt and no future appt 

## 2012-05-19 ENCOUNTER — Other Ambulatory Visit: Payer: Self-pay | Admitting: Family Medicine

## 2012-05-25 ENCOUNTER — Other Ambulatory Visit: Payer: Self-pay | Admitting: *Deleted

## 2012-05-25 MED ORDER — GLUCOSE BLOOD VI STRP: ORAL_STRIP | Status: DC

## 2012-05-25 NOTE — Telephone Encounter (Signed)
Received fax from [facility] saying directions on test strips has to have directions not just use as directed, I changed Rx and sent it back electronically

## 2012-06-03 ENCOUNTER — Other Ambulatory Visit: Payer: Self-pay | Admitting: *Deleted

## 2012-06-03 MED ORDER — ATORVASTATIN CALCIUM 20 MG PO TABS: 20.0000 mg | ORAL_TABLET | Freq: Every day | ORAL | Status: DC

## 2012-06-24 ENCOUNTER — Other Ambulatory Visit: Payer: Self-pay | Admitting: *Deleted

## 2012-06-24 MED ORDER — ONETOUCH DELICA LANCETS 33G MISC: 1.0000 | Status: DC

## 2012-06-29 NOTE — Telephone Encounter (Signed)
Pt left v/m ck on status of refill for test strips; spoke with Benay at [facility] and rx went thru and will be ready for pick up. Pt notified.

## 2012-07-01 ENCOUNTER — Other Ambulatory Visit: Payer: Self-pay | Admitting: *Deleted

## 2012-07-01 MED ORDER — RAMIPRIL 5 MG PO CAPS: 5.0000 mg | ORAL_CAPSULE | Freq: Every day | ORAL | Status: DC

## 2012-07-14 ENCOUNTER — Encounter: Payer: Self-pay | Admitting: Family Medicine

## 2012-07-14 ENCOUNTER — Ambulatory Visit (INDEPENDENT_AMBULATORY_CARE_PROVIDER_SITE_OTHER): Payer: 59 | Admitting: Family Medicine

## 2012-07-14 VITALS — BP 122/85 | HR 69 | Temp 98.4°F | Ht 63.0 in | Wt 117.8 lb

## 2012-07-14 DIAGNOSIS — E119 Type 2 diabetes mellitus without complications: Secondary | ICD-10-CM

## 2012-07-14 DIAGNOSIS — I10 Essential (primary) hypertension: Secondary | ICD-10-CM

## 2012-07-14 DIAGNOSIS — E785 Hyperlipidemia, unspecified: Secondary | ICD-10-CM

## 2012-07-14 LAB — CBC WITH DIFFERENTIAL/PLATELET
Basophils Relative: 1 % (ref 0.0–3.0)
Eosinophils Relative: 2 % (ref 0.0–5.0)
HCT: 40.5 % (ref 36.0–46.0)
Hemoglobin: 13.8 g/dL (ref 12.0–15.0)
Lymphocytes Relative: 23.7 % (ref 12.0–46.0)
Lymphs Abs: 2.1 10*3/uL (ref 0.7–4.0)
Monocytes Relative: 7.9 % (ref 3.0–12.0)
Neutro Abs: 5.7 10*3/uL (ref 1.4–7.7)
RBC: 4.41 Mil/uL (ref 3.87–5.11)
RDW: 13.5 % (ref 11.5–14.6)

## 2012-07-14 LAB — COMPREHENSIVE METABOLIC PANEL
ALT: 12 U/L (ref 0–35)
BUN: 11 mg/dL (ref 6–23)
CO2: 25 mEq/L (ref 19–32)
Calcium: 9.7 mg/dL (ref 8.4–10.5)
Chloride: 101 mEq/L (ref 96–112)
Creatinine, Ser: 0.8 mg/dL (ref 0.4–1.2)
GFR: 78.23 mL/min (ref >60.00)
Glucose, Bld: 87 mg/dL (ref 70–99)
Total Bilirubin: 0.6 mg/dL (ref 0.3–1.2)

## 2012-07-14 LAB — TSH: TSH: 1.69 u[IU]/mL (ref 0.35–5.50)

## 2012-07-14 LAB — HEMOGLOBIN A1C: Hgb A1c MFr Bld: 6.4 % (ref 4.6–6.5)

## 2012-07-14 LAB — LIPID PANEL
Cholesterol: 129 mg/dL (ref 0–200)
LDL Cholesterol: 46 mg/dL (ref 0–99)
Triglycerides: 185 mg/dL — ABNORMAL HIGH (ref 0.0–149.0)

## 2012-07-14 MED ORDER — SPIRONOLACTONE 25 MG PO TABS: 25.0000 mg | ORAL_TABLET | Freq: Every day | ORAL | Status: DC

## 2012-07-14 MED ORDER — METFORMIN HCL 500 MG PO TABS: 500.0000 mg | ORAL_TABLET | Freq: Two times a day (BID) | ORAL | Status: DC

## 2012-07-14 MED ORDER — ATORVASTATIN CALCIUM 20 MG PO TABS: 20.0000 mg | ORAL_TABLET | Freq: Every day | ORAL | Status: DC

## 2012-07-14 MED ORDER — ALENDRONATE SODIUM 70 MG PO TABS: 70.0000 mg | ORAL_TABLET | ORAL | Status: DC

## 2012-07-14 MED ORDER — RAMIPRIL 5 MG PO CAPS: 5.0000 mg | ORAL_CAPSULE | Freq: Every day | ORAL | Status: DC

## 2012-07-14 MED ORDER — BISOPROLOL-HYDROCHLOROTHIAZIDE 5-6.25 MG PO TABS: 1.0000 | ORAL_TABLET | Freq: Every day | ORAL | Status: DC

## 2012-07-14 NOTE — Assessment & Plan Note (Signed)
bp in fair control at this time  No changes needed  Disc lifstyle change with low sodium diet and exercise  Lab today and update

## 2012-07-14 NOTE — Assessment & Plan Note (Signed)
Lab today on statin and diet Good diet  Disc goals for this with DM

## 2012-07-14 NOTE — Assessment & Plan Note (Signed)
Overdue for A1c but control sounds very good Inf pt she can check sugar qd instead of bid  No low sugar utd opthy

## 2012-07-14 NOTE — Patient Instructions (Addendum)
Labs today Keep up healthy diet and take care of yourself  Follow up in 6 months for annual exam with lab prior

## 2012-07-14 NOTE — Progress Notes (Signed)
Subjective:    Patient ID: Candace Newman, female    DOB: Oct 02, 1940, 72 y.o.   MRN: [id number]  HPI Here for f/u of chronic medical problems   Has been doing well  Active outside - and has been trying to keep the ticks away - she has to be careful Has not been sick   Wt is down 1 lb with bmi of 20  bp is up a bit today since she has not taken her medicine yet today  No cp or palpitations or headaches or edema  No side effects to medicines  BP Readings from Last 3 Encounters:  07/14/12 140/78  07/29/11 120/78  07/01/11 128/70      Hyperlipidemia Statin and diet - she tries to eat a lot of green veg and avoiding sugar and fatty foods  Lab due- for lipids    Diabetes Home sugar results - overall stable - fasting tends to be 198-120s (twice daily) DM diet -very good  Exercise - some / inside for the most part- stays very active  Symptoms-none  A1C last  Lab Results  Component Value Date   HGBA1C 6.5 07/22/2011    No problems with medications  Renal protection is on ace Last eye exam - about a month ago - no retinopathy  Also had her mammogram   Up to date on her imms  Not interested in zostavax   Patient Active Problem List   Diagnosis Date Noted  . Osteopenia 07/29/2011  . Hyponatremia 02/19/2011  . OSTEOPENIA 06/23/2007  . POSTMENOPAUSAL STATUS 04/14/2007  . DIABETES MELLITUS, TYPE II 03/12/2007  . HYPERLIPIDEMIA 03/12/2007  . GLAUCOMA 03/12/2007  . HYPERTENSION 03/12/2007   Past Medical History  Diagnosis Date  . Diabetes mellitus     type II  . Hyperlipidemia   . Hypertension   . Osteopenia    Past Surgical History  Procedure Laterality Date  . Abdominal hysterectomy      ovaries intact -non cancer done for bleeding   History  Substance Use Topics  . Smoking status: Former Smoker    Quit date: 01/07/2005  . Smokeless tobacco: Not on file  . Alcohol Use: No   Family History  Problem Relation Age of Onset  . Heart disease Mother    CAD  . Hypertension Mother   . Heart disease Father     CAD  . Diabetes Brother   . Cancer Brother     lymph node CA   No Known Allergies Current Outpatient Prescriptions on File Prior to Visit  Medication Sig Dispense Refill  . albuterol (PROVENTIL HFA;VENTOLIN HFA) 108 (90 BASE) MCG/ACT inhaler Inhale 2 puffs into the lungs every 4 (four) hours as needed for wheezing.  1 Inhaler  0  . glucose blood (ONE TOUCH ULTRA TEST) test strip CHECK BLOOD SUGAR QD FOR DM 250.0  100 each  1  . glucose monitoring kit (FREESTYLE) monitoring kit Check blood sugar twice a day and as needed for new DM 250.0       . latanoprost (XALATAN) 0.005 % ophthalmic solution Use eye drops as directed.       . Multiple Vitamin (MULTIVITAMIN) tablet Take 1 tablet by mouth daily.        Letta Pate DELICA LANCETS 33G MISC 1 strip by Other route as directed.  100 each  0  . timolol (BETIMOL) 0.5 % ophthalmic solution Use eye drops as directed.        No current facility-administered medications on  file prior to visit.    Review of Systems Review of Systems  Constitutional: Negative for fever, appetite change, fatigue and unexpected weight change.  Eyes: Negative for pain and visual disturbance.  Respiratory: Negative for cough and shortness of breath.   Cardiovascular: Negative for cp or palpitations    Gastrointestinal: Negative for nausea, diarrhea and constipation.  Genitourinary: Negative for urgency and frequency.  Skin: Negative for pallor or rash   Neurological: Negative for weakness, light-headedness, numbness and headaches.  Hematological: Negative for adenopathy. Does not bruise/bleed easily.  Psychiatric/Behavioral: Negative for dysphoric mood. The patient is not nervous/anxious.         Objective:   Physical Exam  Constitutional: She appears well-developed and well-nourished. No distress.  HENT:  Head: Normocephalic and atraumatic.  Right Ear: External ear normal.  Left Ear: External ear  normal.  Nose: Nose normal.  Mouth/Throat: Oropharynx is clear and moist.  Eyes: Conjunctivae and EOM are normal. Pupils are equal, round, and reactive to light. Right eye exhibits no discharge. Left eye exhibits no discharge. No scleral icterus.  Neck: Normal range of motion. Neck supple. No JVD present. Carotid bruit is not present. No thyromegaly present.  Cardiovascular: Normal rate, regular rhythm, normal heart sounds and intact distal pulses.  Exam reveals no gallop.   Pulmonary/Chest: Effort normal and breath sounds normal. No respiratory distress. She has no wheezes. She has no rales.  Abdominal: Soft. Bowel sounds are normal. She exhibits no distension, no abdominal bruit and no mass. There is no tenderness.  Musculoskeletal: She exhibits no edema and no tenderness.  Lymphadenopathy:    She has no cervical adenopathy.  Neurological: She is alert. She has normal reflexes. No cranial nerve deficit. She exhibits normal muscle tone. Coordination normal.  Skin: Skin is warm and dry. No rash noted. No erythema. No pallor.  Psychiatric: She has a normal mood and affect.          Assessment & Plan:

## 2012-07-15 ENCOUNTER — Encounter: Payer: Self-pay | Admitting: *Deleted

## 2012-07-15 ENCOUNTER — Other Ambulatory Visit: Payer: Self-pay | Admitting: Family Medicine

## 2012-07-16 ENCOUNTER — Other Ambulatory Visit: Payer: Self-pay

## 2013-01-04 ENCOUNTER — Telehealth: Payer: Self-pay | Admitting: Family Medicine

## 2013-01-04 DIAGNOSIS — E785 Hyperlipidemia, unspecified: Secondary | ICD-10-CM

## 2013-01-04 DIAGNOSIS — E871 Hypo-osmolality and hyponatremia: Secondary | ICD-10-CM

## 2013-01-04 DIAGNOSIS — I10 Essential (primary) hypertension: Secondary | ICD-10-CM

## 2013-01-04 DIAGNOSIS — M899 Disorder of bone, unspecified: Secondary | ICD-10-CM

## 2013-01-04 DIAGNOSIS — E119 Type 2 diabetes mellitus without complications: Secondary | ICD-10-CM

## 2013-01-04 NOTE — Telephone Encounter (Signed)
Message copied by Judy Pimple on Mon Jan 04, 2013  7:06 PM ------      Message from: Alvina Chou      Created: Fri Jan 01, 2013 11:27 AM      Regarding: Lab orders for Tuesday, 1.6.15       Patient is scheduled for CPX labs, please order future labs, Thanks , Terri       ------

## 2013-01-12 ENCOUNTER — Other Ambulatory Visit (INDEPENDENT_AMBULATORY_CARE_PROVIDER_SITE_OTHER): Payer: 59

## 2013-01-12 DIAGNOSIS — M899 Disorder of bone, unspecified: Secondary | ICD-10-CM

## 2013-01-12 DIAGNOSIS — E119 Type 2 diabetes mellitus without complications: Secondary | ICD-10-CM

## 2013-01-12 DIAGNOSIS — E871 Hypo-osmolality and hyponatremia: Secondary | ICD-10-CM

## 2013-01-12 DIAGNOSIS — M949 Disorder of cartilage, unspecified: Secondary | ICD-10-CM

## 2013-01-12 DIAGNOSIS — I10 Essential (primary) hypertension: Secondary | ICD-10-CM

## 2013-01-12 DIAGNOSIS — E785 Hyperlipidemia, unspecified: Secondary | ICD-10-CM

## 2013-01-12 LAB — COMPREHENSIVE METABOLIC PANEL
ALBUMIN: 4.4 g/dL (ref 3.5–5.2)
ALK PHOS: 59 U/L (ref 39–117)
ALT: 14 U/L (ref 0–35)
AST: 15 U/L (ref 0–37)
BUN: 12 mg/dL (ref 6–23)
CO2: 31 mEq/L (ref 19–32)
Calcium: 9.8 mg/dL (ref 8.4–10.5)
Chloride: 97 mEq/L (ref 96–112)
Creatinine, Ser: 0.8 mg/dL (ref 0.4–1.2)
GFR: 76.97 mL/min (ref >60.00)
GLUCOSE: 143 mg/dL — AB (ref 70–99)
POTASSIUM: 5.3 meq/L — AB (ref 3.5–5.1)
SODIUM: 134 meq/L — AB (ref 135–145)
TOTAL PROTEIN: 7.1 g/dL (ref 6.0–8.3)
Total Bilirubin: 0.8 mg/dL (ref 0.3–1.2)

## 2013-01-12 LAB — CBC WITH DIFFERENTIAL/PLATELET
BASOS ABS: 0 10*3/uL (ref 0.0–0.1)
Basophils Relative: 0.4 % (ref 0.0–3.0)
EOS PCT: 1.3 % (ref 0.0–5.0)
Eosinophils Absolute: 0.1 10*3/uL (ref 0.0–0.7)
HCT: 41.3 % (ref 36.0–46.0)
Hemoglobin: 14 g/dL (ref 12.0–15.0)
Lymphocytes Relative: 18.1 % (ref 12.0–46.0)
Lymphs Abs: 1.3 10*3/uL (ref 0.7–4.0)
MCHC: 33.9 g/dL (ref 30.0–36.0)
MCV: 91.1 fl (ref 78.0–100.0)
MONO ABS: 0.5 10*3/uL (ref 0.1–1.0)
MONOS PCT: 7.3 % (ref 3.0–12.0)
NEUTROS PCT: 72.9 % (ref 43.0–77.0)
Neutro Abs: 5.3 10*3/uL (ref 1.4–7.7)
PLATELETS: 251 10*3/uL (ref 150.0–400.0)
RBC: 4.54 Mil/uL (ref 3.87–5.11)
RDW: 13.1 % (ref 11.5–14.6)
WBC: 7.2 10*3/uL (ref 4.5–10.5)

## 2013-01-12 LAB — TSH: TSH: 2.71 u[IU]/mL (ref 0.35–5.50)

## 2013-01-12 LAB — LIPID PANEL
CHOL/HDL RATIO: 3
CHOLESTEROL: 114 mg/dL (ref 0–200)
HDL: 43.3 mg/dL (ref >39.00)
LDL Cholesterol: 53 mg/dL (ref 0–99)
Triglycerides: 88 mg/dL (ref 0.0–149.0)
VLDL: 17.6 mg/dL (ref 0.0–40.0)

## 2013-01-12 LAB — HEMOGLOBIN A1C: HEMOGLOBIN A1C: 6.5 % (ref 4.6–6.5)

## 2013-01-13 LAB — VITAMIN D 25 HYDROXY (VIT D DEFICIENCY, FRACTURES): VIT D 25 HYDROXY: 43 ng/mL (ref 30–89)

## 2013-01-14 ENCOUNTER — Other Ambulatory Visit: Payer: Self-pay | Admitting: Family Medicine

## 2013-01-19 ENCOUNTER — Encounter: Payer: Self-pay | Admitting: Family Medicine

## 2013-01-19 ENCOUNTER — Telehealth: Payer: Self-pay | Admitting: Family Medicine

## 2013-01-19 ENCOUNTER — Ambulatory Visit (INDEPENDENT_AMBULATORY_CARE_PROVIDER_SITE_OTHER): Payer: 59 | Admitting: Family Medicine

## 2013-01-19 VITALS — BP 128/78 | HR 68 | Temp 98.2°F | Ht 61.5 in | Wt 121.5 lb

## 2013-01-19 DIAGNOSIS — E785 Hyperlipidemia, unspecified: Secondary | ICD-10-CM

## 2013-01-19 DIAGNOSIS — Z0000 Encounter for general adult medical examination without abnormal findings: Secondary | ICD-10-CM

## 2013-01-19 DIAGNOSIS — M899 Disorder of bone, unspecified: Secondary | ICD-10-CM

## 2013-01-19 DIAGNOSIS — E119 Type 2 diabetes mellitus without complications: Secondary | ICD-10-CM

## 2013-01-19 DIAGNOSIS — M8580 Other specified disorders of bone density and structure, unspecified site: Secondary | ICD-10-CM

## 2013-01-19 DIAGNOSIS — E875 Hyperkalemia: Secondary | ICD-10-CM

## 2013-01-19 DIAGNOSIS — I10 Essential (primary) hypertension: Secondary | ICD-10-CM

## 2013-01-19 DIAGNOSIS — M949 Disorder of cartilage, unspecified: Secondary | ICD-10-CM

## 2013-01-19 DIAGNOSIS — Z860100 Personal history of colon polyps, unspecified: Secondary | ICD-10-CM

## 2013-01-19 DIAGNOSIS — Z23 Encounter for immunization: Secondary | ICD-10-CM

## 2013-01-19 NOTE — Assessment & Plan Note (Signed)
Fosamax since 2011- will take one more year Disc need for calcium/ vitamin D/ wt bearing exercise and bone density test every 2 y to monitor Disc safety/ fracture risk in detail  sched dexa

## 2013-01-19 NOTE — Telephone Encounter (Signed)
Called Dr. Ardis Hughs office ([facility]).  Pt's last colonscopy was completed 01/24/2006, pt was due 01/2011, but did not want to schedule at this time.  She did schedule her bone density for 01/26/13 with [facility]. / lt

## 2013-01-19 NOTE — Assessment & Plan Note (Signed)
BP: 128/78 mmHg  bp in fair control at this time  No changes needed Disc lifstyle change with low sodium diet and exercise   Labs rev

## 2013-01-19 NOTE — Assessment & Plan Note (Signed)
Reviewed health habits including diet and exercise and skin cancer prevention Reviewed appropriate screening tests for age  Also reviewed health mt list, fam hx and immunization status , as well as social and family history   Wellness lab reviewed

## 2013-01-19 NOTE — Progress Notes (Signed)
Subjective:    Patient ID: Candace Newman, female    DOB: 1940/02/21, 73 y.o.   MRN: [id number]  HPI I have personally reviewed the Medicare Annual Wellness questionnaire and have noted 1. The patient's medical and social history 2. Their use of alcohol, tobacco or illicit drugs 3. Their current medications and supplements 4. The patient's functional ability including ADL's, fall risks, home safety risks and hearing or visual             impairment. 5. Diet and physical activities 6. Evidence for depression or mood disorders  The patients weight, height, BMI have been recorded in the chart and visual acuity is per eye clinic.  I have made referrals, counseling and provided education to the patient based review of the above and I have provided the pt with a written personalized care plan for preventive services.  Wt is up 4 lb from the holidays  Now eats a very healthy diet  Some exercise   See scanned forms.  Routine anticipatory guidance given to patient.  See health maintenance. Flu- just had it today Shingles- declines vaccine  PNA 7/12 vaccine  Tetanus shot 3/09 Colonocopy 1/08 - (had polyps)  Breast cancer screening 2/14 nl  Self exam-no lumps  Advance directive- does not have a living will  Cognitive function addressed- see scanned forms- and if abnormal then additional documentation follows. - good memory overall   PMH and SH reviewed  Meds, vitals, and allergies reviewed.   ROS: See HPI.  Otherwise negative.    K is 5.3- unusual for her , she takes mvi , she eats a lot of bananas  On ace   bp is stable today  No cp or palpitations or headaches or edema  No side effects to medicines  BP Readings from Last 3 Encounters:  01/19/13 128/78  07/14/12 122/85  07/29/11 120/78       Chemistry      Component Value Date/Time   NA 134* 01/12/2013 0905   K 5.3* 01/12/2013 0905   CL 97 01/12/2013 0905   CO2 31 01/12/2013 0905   BUN 12 01/12/2013 0905   CREATININE 0.8  01/12/2013 0905      Component Value Date/Time   CALCIUM 9.8 01/12/2013 0905   ALKPHOS 59 01/12/2013 0905   AST 15 01/12/2013 0905   ALT 14 01/12/2013 0905   BILITOT 0.8 01/12/2013 0905       Diabetes Home sugar results - overall very good  DM diet - is very good now after the holidays / she has good will power  Exercise calethenics/ squats and walking  Symptoms-none  A1C last  Lab Results  Component Value Date   HGBA1C 6.5 01/12/2013   Stable overall - iin good control  No problems with medications -metformin  Renal protection on ace Last eye exam 6/14  Osteopenia Fosamax since 2011 D level is 69- she takes her ca and D No broken bones this year   Hyperlipidemia Lab Results  Component Value Date   CHOL 114 01/12/2013   CHOL 129 07/14/2012   CHOL 136 07/22/2011   Lab Results  Component Value Date   HDL 43.30 01/12/2013   HDL 46.10 07/14/2012   HDL 54.00 07/22/2011   Lab Results  Component Value Date   LDLCALC 53 01/12/2013   LDLCALC 46 07/14/2012   LDLCALC 61 07/22/2011   Lab Results  Component Value Date   TRIG 88.0 01/12/2013   TRIG 185.0* 07/14/2012  TRIG 105.0 07/22/2011   Lab Results  Component Value Date   CHOLHDL 3 01/12/2013   CHOLHDL 3 07/14/2012   CHOLHDL 3 07/22/2011   No results found for this basename: LDLDIRECT    Very well controlled  Lab Results  Component Value Date   WBC 7.2 01/12/2013   HGB 14.0 01/12/2013   HCT 41.3 01/12/2013   MCV 91.1 01/12/2013   PLT 251.0 01/12/2013    Lab Results  Component Value Date   TSH 2.71 01/12/2013      Patient Active Problem List   Diagnosis Date Noted  . Encounter for Medicare annual wellness exam 01/19/2013  . Hyperkalemia 01/19/2013  . Osteopenia 07/29/2011  . Hyponatremia 02/19/2011  . OSTEOPENIA 06/23/2007  . POSTMENOPAUSAL STATUS 04/14/2007  . DIABETES MELLITUS, TYPE II 03/12/2007  . HYPERLIPIDEMIA 03/12/2007  . GLAUCOMA 03/12/2007  . HYPERTENSION 03/12/2007   Past Medical History  Diagnosis Date  . Diabetes  mellitus     type II  . Hyperlipidemia   . Hypertension   . Osteopenia    Past Surgical History  Procedure Laterality Date  . Abdominal hysterectomy      ovaries intact -non cancer done for bleeding   History  Substance Use Topics  . Smoking status: Former Smoker    Quit date: 01/07/2005  . Smokeless tobacco: Not on file  . Alcohol Use: No   Family History  Problem Relation Age of Onset  . Heart disease Mother     CAD  . Hypertension Mother   . Heart disease Father     CAD  . Diabetes Brother   . Cancer Brother     lymph node CA   No Known Allergies Current Outpatient Prescriptions on File Prior to Visit  Medication Sig Dispense Refill  . albuterol (PROVENTIL HFA;VENTOLIN HFA) 108 (90 BASE) MCG/ACT inhaler Inhale 2 puffs into the lungs every 4 (four) hours as needed for wheezing.  1 Inhaler  0  . alendronate (FOSAMAX) 70 MG tablet Take 1 tablet (70 mg total) by mouth every 7 (seven) days. Take with a full glass of water on an empty stomach.  4 tablet  12  . atorvastatin (LIPITOR) 20 MG tablet Take 1 tablet (20 mg total) by mouth daily.  30 tablet  11  . bisoprolol-hydrochlorothiazide (ZIAC) 5-6.25 MG per tablet Take 1 tablet by mouth daily.  30 tablet  11  . glucose monitoring kit (FREESTYLE) monitoring kit Check blood sugar twice a day and as needed for new DM 250.0       . latanoprost (XALATAN) 0.005 % ophthalmic solution Use eye drops as directed.       . metFORMIN (GLUCOPHAGE) 500 MG tablet Take 1 tablet (500 mg total) by mouth 2 (two) times daily with a meal.  60 tablet  11  . Multiple Vitamin (MULTIVITAMIN) tablet Take 1 tablet by mouth daily.        . ONE TOUCH ULTRA TEST test strip CHECK BLOOD SUGAR ONCE DAILY  100 each  1  . ONETOUCH DELICA LANCETS 70Y MISC 1 strip by Other route as directed.  100 each  0  . ramipril (ALTACE) 5 MG capsule Take 1 capsule (5 mg total) by mouth daily.  30 capsule  11  . spironolactone (ALDACTONE) 25 MG tablet Take 1 tablet (25 mg  total) by mouth daily.  30 tablet  11  . timolol (BETIMOL) 0.5 % ophthalmic solution Use eye drops as directed.  No current facility-administered medications on file prior to visit.    Review of Systems Review of Systems  Constitutional: Negative for fever, appetite change, fatigue and unexpected weight change.  Eyes: Negative for pain and visual disturbance.  Respiratory: Negative for cough and shortness of breath.   Cardiovascular: Negative for cp or palpitations    Gastrointestinal: Negative for nausea, diarrhea and constipation.  Genitourinary: Negative for urgency and frequency.  Skin: Negative for pallor or rash   Neurological: Negative for weakness, light-headedness, numbness and headaches.  Hematological: Negative for adenopathy. Does not bruise/bleed easily.  Psychiatric/Behavioral: Negative for dysphoric mood. The patient is not nervous/anxious.         Objective:   Physical Exam  Constitutional: She appears well-developed and well-nourished. No distress.  HENT:  Head: Normocephalic and atraumatic.  Right Ear: External ear normal.  Left Ear: External ear normal.  Mouth/Throat: Oropharynx is clear and moist.  Eyes: Conjunctivae and EOM are normal. Pupils are equal, round, and reactive to light. No scleral icterus.  Neck: Normal range of motion. Neck supple. No JVD present. Carotid bruit is not present. No thyromegaly present.  Cardiovascular: Normal rate, regular rhythm, normal heart sounds and intact distal pulses.  Exam reveals no gallop.   Pulmonary/Chest: Effort normal and breath sounds normal. No respiratory distress. She has no wheezes. She exhibits no tenderness.  Abdominal: Soft. Bowel sounds are normal. She exhibits no distension, no abdominal bruit and no mass. There is no tenderness.  Genitourinary: No breast swelling, tenderness, discharge or bleeding.  Breast exam: No mass, nodules, thickening, tenderness, bulging, retraction, inflamation, nipple  discharge or skin changes noted.  No axillary or clavicular LA.    Musculoskeletal: Normal range of motion. She exhibits no edema and no tenderness.  Lymphadenopathy:    She has no cervical adenopathy.  Neurological: She is alert. She has normal reflexes. No cranial nerve deficit. She exhibits normal muscle tone. Coordination normal.  Skin: Skin is warm and dry. No rash noted. No erythema. No pallor.  sks diffusely  Psychiatric: She has a normal mood and affect.          Assessment & Plan:

## 2013-01-19 NOTE — Assessment & Plan Note (Signed)
Disc goals for lipids and reasons to control them Rev labs with pt Rev low sat fat diet in detail   

## 2013-01-19 NOTE — Progress Notes (Signed)
Pre-visit discussion using our clinic review tool. No additional management support is needed unless otherwise documented below in the visit note.  

## 2013-01-19 NOTE — Telephone Encounter (Signed)
Patient advised.

## 2013-01-19 NOTE — Assessment & Plan Note (Signed)
Stable sugars  Rev low glycemic diet  Cutting back on bananas F/u 6 mo

## 2013-01-19 NOTE — Telephone Encounter (Signed)
Please ask her to call us when she is ready to schedule colonoscopy-thanks

## 2013-01-19 NOTE — Patient Instructions (Signed)
Please call GI to see when pt is due for her next colonoscopy-thanks  Don't forget to schedule your own mammogram Also consider working on a living will - I gave you a handout on that  Potassium level is high -so cut back on bananas Schedule non fasting lab in 2 weeks to re check that  We will refer you for a bone density test when you check out

## 2013-01-19 NOTE — Assessment & Plan Note (Signed)
Will cut back on bananas in diet  Re check 2 wk If no imp - will reduce ace

## 2013-01-20 ENCOUNTER — Other Ambulatory Visit: Payer: 59 | Admitting: Gastroenterology

## 2013-01-20 ENCOUNTER — Telehealth: Payer: Self-pay

## 2013-01-20 NOTE — Telephone Encounter (Signed)
Relevant patient education mailed to patient.  

## 2013-01-25 ENCOUNTER — Other Ambulatory Visit: Payer: Self-pay | Admitting: Family Medicine

## 2013-01-26 ENCOUNTER — Ambulatory Visit (INDEPENDENT_AMBULATORY_CARE_PROVIDER_SITE_OTHER)
Admission: RE | Admit: 2013-01-26 | Discharge: 2013-01-26 | Disposition: A | Discharge status: Home / Self Care | Payer: 59 | Source: Ambulatory Visit | Attending: Family Medicine | Admitting: Family Medicine

## 2013-01-26 DIAGNOSIS — M8580 Other specified disorders of bone density and structure, unspecified site: Secondary | ICD-10-CM

## 2013-01-26 DIAGNOSIS — M949 Disorder of cartilage, unspecified: Secondary | ICD-10-CM

## 2013-01-26 DIAGNOSIS — M899 Disorder of bone, unspecified: Secondary | ICD-10-CM

## 2013-01-26 LAB — HM DEXA SCAN

## 2013-02-02 ENCOUNTER — Other Ambulatory Visit (INDEPENDENT_AMBULATORY_CARE_PROVIDER_SITE_OTHER): Payer: 59

## 2013-02-02 DIAGNOSIS — I10 Essential (primary) hypertension: Secondary | ICD-10-CM

## 2013-02-02 DIAGNOSIS — E875 Hyperkalemia: Secondary | ICD-10-CM

## 2013-02-02 DIAGNOSIS — E871 Hypo-osmolality and hyponatremia: Secondary | ICD-10-CM

## 2013-02-02 LAB — BASIC METABOLIC PANEL
BUN: 13 mg/dL (ref 6–23)
CO2: 29 mEq/L (ref 19–32)
Calcium: 9.8 mg/dL (ref 8.4–10.5)
Chloride: 99 mEq/L (ref 96–112)
Creatinine, Ser: 0.9 mg/dL (ref 0.4–1.2)
GFR: 63.61 mL/min (ref >60.00)
Glucose, Bld: 161 mg/dL — ABNORMAL HIGH (ref 70–99)
POTASSIUM: 4.6 meq/L (ref 3.5–5.1)
SODIUM: 135 meq/L (ref 135–145)

## 2013-02-03 ENCOUNTER — Encounter: Payer: Self-pay | Admitting: *Deleted

## 2013-02-09 ENCOUNTER — Encounter: Payer: Self-pay | Admitting: Family Medicine

## 2013-02-09 ENCOUNTER — Encounter: Payer: Self-pay | Admitting: *Deleted

## 2013-02-10 ENCOUNTER — Telehealth: Payer: Self-pay | Admitting: Family Medicine

## 2013-02-10 NOTE — Telephone Encounter (Signed)
Relevant patient education mailed to patient.  

## 2013-03-08 ENCOUNTER — Other Ambulatory Visit: Payer: Self-pay

## 2013-03-08 DIAGNOSIS — Z1231 Encounter for screening mammogram for malignant neoplasm of breast: Secondary | ICD-10-CM

## 2013-03-15 ENCOUNTER — Ambulatory Visit
Admission: RE | Admit: 2013-03-15 | Discharge: 2013-03-15 | Disposition: A | Discharge status: Home / Self Care | Payer: 59 | Source: Ambulatory Visit

## 2013-03-15 DIAGNOSIS — Z1231 Encounter for screening mammogram for malignant neoplasm of breast: Secondary | ICD-10-CM

## 2013-03-16 ENCOUNTER — Encounter: Payer: Self-pay | Admitting: Family Medicine

## 2013-05-06 ENCOUNTER — Telehealth: Payer: Self-pay

## 2013-05-06 NOTE — Telephone Encounter (Signed)
[facility] left v/m; [employer] no longer covers [employer] diabetic supplies and [facility] request new order for accu check meter, test strips and lancets. There are several types of accu chek meters, strips and lancets which do you want pt to use.

## 2013-05-07 ENCOUNTER — Other Ambulatory Visit: Payer: Self-pay | Admitting: *Deleted

## 2013-05-07 MED ORDER — ACCU-CHEK AVIVA PLUS W/DEVICE KIT: PACK | Status: DC

## 2013-05-07 MED ORDER — GLUCOSE BLOOD VI STRP: ORAL_STRIP | Status: DC

## 2013-05-07 MED ORDER — ACCU-CHEK SOFT TOUCH LANCETS MISC: Status: DC

## 2013-05-07 NOTE — Telephone Encounter (Signed)
I have no preference- I wrote a px for accu chek supplies and put it in IN box  They can use whatever variant is preferred

## 2013-05-07 NOTE — Telephone Encounter (Signed)
Rx faxed to pharmacy  

## 2013-07-22 ENCOUNTER — Other Ambulatory Visit: Payer: Self-pay | Admitting: *Deleted

## 2013-07-22 MED ORDER — SPIRONOLACTONE 25 MG PO TABS: 25.0000 mg | ORAL_TABLET | Freq: Every day | ORAL | Status: DC

## 2013-07-22 MED ORDER — RAMIPRIL 5 MG PO CAPS: 5.0000 mg | ORAL_CAPSULE | Freq: Every day | ORAL | Status: DC

## 2013-07-22 NOTE — Addendum Note (Signed)
Addended by: Tammi Sou on: 07/22/2013 09:16 AM   Modules accepted: Orders

## 2013-08-02 ENCOUNTER — Ambulatory Visit (INDEPENDENT_AMBULATORY_CARE_PROVIDER_SITE_OTHER): Payer: 59 | Admitting: Family Medicine

## 2013-08-02 ENCOUNTER — Encounter: Payer: Self-pay | Admitting: Family Medicine

## 2013-08-02 VITALS — BP 130/78 | HR 67 | Temp 98.2°F | Ht 61.5 in | Wt 126.8 lb

## 2013-08-02 DIAGNOSIS — E119 Type 2 diabetes mellitus without complications: Secondary | ICD-10-CM

## 2013-08-02 DIAGNOSIS — I10 Essential (primary) hypertension: Secondary | ICD-10-CM

## 2013-08-02 LAB — BASIC METABOLIC PANEL
BUN: 14 mg/dL (ref 6–23)
CALCIUM: 9.5 mg/dL (ref 8.4–10.5)
CO2: 30 mEq/L (ref 19–32)
Chloride: 100 mEq/L (ref 96–112)
Creatinine, Ser: 0.9 mg/dL (ref 0.4–1.2)
GFR: 66.87 mL/min (ref >60.00)
Glucose, Bld: 130 mg/dL — ABNORMAL HIGH (ref 70–99)
POTASSIUM: 4.6 meq/L (ref 3.5–5.1)
Sodium: 136 mEq/L (ref 135–145)

## 2013-08-02 LAB — HEMOGLOBIN A1C: Hgb A1c MFr Bld: 6.6 % — ABNORMAL HIGH (ref 4.6–6.5)

## 2013-08-02 NOTE — Progress Notes (Signed)
Pre visit review using our clinic review tool, if applicable. No additional management support is needed unless otherwise documented below in the visit note. 

## 2013-08-02 NOTE — Progress Notes (Signed)
Subjective:    Patient ID: Candace Newman, female    DOB: 1940/12/08, 73 y.o.   MRN: [id number]  HPI Here for f/u of chronic medical problems   Overall feeling good   Wt is up 5 lb with bmi of 23  She states she may be eating too much  She gets a fair amt of exercise  - works in the garden quite a bit and some walking , and a lot of housework   Diabetes Home sugar results - has a new meter and her glucose runs a bit higher - usually in the 130s  DM diet - she sticks with it most of the time (turns away sweets) Exercise see above  Symptoms- none  A1C last  Lab Results  Component Value Date   HGBA1C 6.5 01/12/2013   Due for a1c No problems with medications  Renal protection ace  Last eye exam - has upcoming appt , last one was in April 2015   Last visit high K that normalized   bp is stable today  No cp or palpitations or headaches or edema  No side effects to medicines  BP Readings from Last 3 Encounters:  08/02/13 130/78  01/19/13 128/78  07/14/12 122/85        Patient Active Problem List   Diagnosis Date Noted  . Encounter for Medicare annual wellness exam 01/19/2013  . Personal history of colonic polyps 01/19/2013  . Osteopenia 07/29/2011  . Hyponatremia 02/19/2011  . POSTMENOPAUSAL STATUS 04/14/2007  . DIABETES MELLITUS, TYPE II 03/12/2007  . HYPERLIPIDEMIA 03/12/2007  . GLAUCOMA 03/12/2007  . HYPERTENSION 03/12/2007   Past Medical History  Diagnosis Date  . Diabetes mellitus     type II  . Hyperlipidemia   . Hypertension   . Osteopenia    Past Surgical History  Procedure Laterality Date  . Abdominal hysterectomy      ovaries intact -non cancer done for bleeding   History  Substance Use Topics  . Smoking status: Former Smoker    Quit date: 01/07/2005  . Smokeless tobacco: Not on file  . Alcohol Use: No   Family History  Problem Relation Age of Onset  . Heart disease Mother     CAD  . Hypertension Mother   . Heart disease Father    CAD  . Diabetes Brother   . Cancer Brother     lymph node CA   No Known Allergies Current Outpatient Prescriptions on File Prior to Visit  Medication Sig Dispense Refill  . alendronate (FOSAMAX) 70 MG tablet Take 1 tablet (70 mg total) by mouth every 7 (seven) days. Take with a full glass of water on an empty stomach.  4 tablet  12  . atorvastatin (LIPITOR) 20 MG tablet Take 1 tablet (20 mg total) by mouth daily.  30 tablet  11  . bisoprolol-hydrochlorothiazide (ZIAC) 5-6.25 MG per tablet Take 1 tablet by mouth daily.  30 tablet  11  . Blood Glucose Monitoring Suppl (ACCU-CHEK AVIVA PLUS) W/DEVICE KIT Use device to test blood sugar once daily, and PRN. Dx 250.00  1 kit  0  . glucose blood (ACCU-CHEK AVIVA PLUS) test strip Use to test blood sugar once daily, and PRN. Dx 250.00  100 each  1  . Lancets (ACCU-CHEK SOFT TOUCH) lancets Use device to test blood sugar once daily, and PRN. Dx 250.00  100 each  1  . latanoprost (XALATAN) 0.005 % ophthalmic solution Use eye drops as directed.       Marland Kitchen  metFORMIN (GLUCOPHAGE) 500 MG tablet Take 1 tablet (500 mg total) by mouth 2 (two) times daily with a meal.  60 tablet  11  . Multiple Vitamin (MULTIVITAMIN) tablet Take 1 tablet by mouth daily.        . ramipril (ALTACE) 5 MG capsule Take 1 capsule (5 mg total) by mouth daily.  30 capsule  0  . spironolactone (ALDACTONE) 25 MG tablet Take 1 tablet (25 mg total) by mouth daily.  30 tablet  0  . timolol (BETIMOL) 0.5 % ophthalmic solution Use eye drops as directed.       Marland Kitchen albuterol (PROVENTIL HFA;VENTOLIN HFA) 108 (90 BASE) MCG/ACT inhaler Inhale 2 puffs into the lungs every 4 (four) hours as needed for wheezing.  1 Inhaler  0   No current facility-administered medications on file prior to visit.    Review of Systems Review of Systems  Constitutional: Negative for fever, appetite change, fatigue and unexpected weight change.  Eyes: Negative for pain and visual disturbance.  Respiratory: Negative for  cough and shortness of breath.   Cardiovascular: Negative for cp or palpitations    Gastrointestinal: Negative for nausea, diarrhea and constipation.  Genitourinary: Negative for urgency and frequency.  Skin: Negative for pallor or rash   Neurological: Negative for weakness, light-headedness, numbness and headaches.  Hematological: Negative for adenopathy. Does not bruise/bleed easily.  Psychiatric/Behavioral: Negative for dysphoric mood. The patient is not nervous/anxious.         Objective:   Physical Exam  Constitutional: She appears well-developed and well-nourished. No distress.  HENT:  Head: Normocephalic and atraumatic.  Mouth/Throat: Oropharynx is clear and moist.  Eyes: Conjunctivae and EOM are normal. Pupils are equal, round, and reactive to light.  Neck: Normal range of motion. Neck supple. No JVD present. Carotid bruit is not present. No thyromegaly present.  Cardiovascular: Normal rate, regular rhythm and intact distal pulses.  Exam reveals no gallop.   Pulmonary/Chest: Effort normal and breath sounds normal. No respiratory distress. She has no wheezes. She has no rales.  Abdominal: Soft. Bowel sounds are normal. She exhibits no distension and no mass. There is no tenderness.  Musculoskeletal: She exhibits no edema.  Lymphadenopathy:    She has no cervical adenopathy.  Neurological: She is alert. She has normal reflexes. No cranial nerve deficit. She exhibits normal muscle tone. Coordination normal.  Skin: Skin is warm and dry. No rash noted. No erythema. No pallor.  Psychiatric: She has a normal mood and affect.          Assessment & Plan:   Problem List Items Addressed This Visit     Cardiovascular and Mediastinum   HYPERTENSION - Primary      bp in fair control at this time  BP Readings from Last 1 Encounters:  08/02/13 130/78   No changes needed Disc lifstyle change with low sodium diet and exercise   Bmp today     Relevant Orders      Basic  Metabolic Panel (Completed)     Endocrine   DIABETES MELLITUS, TYPE II     5 lb wt gain and slt inc in glucose readings per pt  Rev low glycemic diet and plan for exercise  A1C today F/u 6 mo if stable     Relevant Orders      Hemoglobin A1c (Completed)

## 2013-08-02 NOTE — Assessment & Plan Note (Signed)
5 lb wt gain and slt inc in glucose readings per pt  Rev low glycemic diet and plan for exercise  A1C today F/u 6 mo if stable

## 2013-08-02 NOTE — Assessment & Plan Note (Signed)
bp in fair control at this time  BP Readings from Last 1 Encounters:  08/02/13 130/78   No changes needed Disc lifstyle change with low sodium diet and exercise   Bmp today

## 2013-08-02 NOTE — Patient Instructions (Signed)
Lab today for A1C  Take care of yourself Stay active and also stick to a diabetic diet  Follow up with me in about 6 months for annual exam with labs prior

## 2013-08-03 ENCOUNTER — Encounter: Payer: Self-pay | Admitting: *Deleted

## 2013-08-05 ENCOUNTER — Other Ambulatory Visit: Payer: Self-pay | Admitting: Family Medicine

## 2013-08-12 ENCOUNTER — Other Ambulatory Visit: Payer: Self-pay | Admitting: Family Medicine

## 2013-08-16 ENCOUNTER — Other Ambulatory Visit: Payer: Self-pay | Admitting: Family Medicine

## 2013-08-19 ENCOUNTER — Other Ambulatory Visit: Payer: Self-pay | Admitting: Family Medicine

## 2013-11-30 ENCOUNTER — Other Ambulatory Visit: Payer: Self-pay | Admitting: Family Medicine

## 2013-12-21 LAB — HM DIABETES EYE EXAM

## 2013-12-29 ENCOUNTER — Encounter: Payer: Self-pay | Admitting: Family Medicine

## 2014-01-28 ENCOUNTER — Other Ambulatory Visit: Payer: 59

## 2014-01-31 ENCOUNTER — Telehealth (INDEPENDENT_AMBULATORY_CARE_PROVIDER_SITE_OTHER): Payer: 59 | Admitting: Family Medicine

## 2014-01-31 ENCOUNTER — Other Ambulatory Visit (INDEPENDENT_AMBULATORY_CARE_PROVIDER_SITE_OTHER): Payer: 59

## 2014-01-31 DIAGNOSIS — E785 Hyperlipidemia, unspecified: Secondary | ICD-10-CM

## 2014-01-31 DIAGNOSIS — E119 Type 2 diabetes mellitus without complications: Secondary | ICD-10-CM

## 2014-01-31 DIAGNOSIS — I10 Essential (primary) hypertension: Secondary | ICD-10-CM

## 2014-01-31 DIAGNOSIS — M8580 Other specified disorders of bone density and structure, unspecified site: Secondary | ICD-10-CM

## 2014-01-31 LAB — COMPREHENSIVE METABOLIC PANEL
ALBUMIN: 4.3 g/dL (ref 3.5–5.2)
ALT: 13 U/L (ref 0–35)
AST: 15 U/L (ref 0–37)
Alkaline Phosphatase: 64 U/L (ref 39–117)
BUN: 12 mg/dL (ref 6–23)
CO2: 30 mEq/L (ref 19–32)
Calcium: 10.5 mg/dL (ref 8.4–10.5)
Chloride: 101 mEq/L (ref 96–112)
Creatinine, Ser: 0.81 mg/dL (ref 0.40–1.20)
GFR: 73.48 mL/min (ref >60.00)
GLUCOSE: 173 mg/dL — AB (ref 70–99)
Potassium: 5.1 mEq/L (ref 3.5–5.1)
SODIUM: 137 meq/L (ref 135–145)
TOTAL PROTEIN: 6.9 g/dL (ref 6.0–8.3)
Total Bilirubin: 0.6 mg/dL (ref 0.2–1.2)

## 2014-01-31 LAB — CBC WITH DIFFERENTIAL/PLATELET
BASOS PCT: 0.5 % (ref 0.0–3.0)
Basophils Absolute: 0 10*3/uL (ref 0.0–0.1)
EOS ABS: 0.1 10*3/uL (ref 0.0–0.7)
Eosinophils Relative: 1.3 % (ref 0.0–5.0)
HCT: 42.1 % (ref 36.0–46.0)
Hemoglobin: 14.1 g/dL (ref 12.0–15.0)
LYMPHS ABS: 1.3 10*3/uL (ref 0.7–4.0)
Lymphocytes Relative: 15.8 % (ref 12.0–46.0)
MCHC: 33.6 g/dL (ref 30.0–36.0)
MCV: 90 fl (ref 78.0–100.0)
Monocytes Absolute: 0.4 10*3/uL (ref 0.1–1.0)
Monocytes Relative: 5.1 % (ref 3.0–12.0)
Neutro Abs: 6.5 10*3/uL (ref 1.4–7.7)
Neutrophils Relative %: 77.3 % — ABNORMAL HIGH (ref 43.0–77.0)
Platelets: 263 10*3/uL (ref 150.0–400.0)
RBC: 4.68 Mil/uL (ref 3.87–5.11)
RDW: 13 % (ref 11.5–15.5)
WBC: 8.4 10*3/uL (ref 4.0–10.5)

## 2014-01-31 LAB — TSH: TSH: 2.94 u[IU]/mL (ref 0.35–4.50)

## 2014-01-31 LAB — HEMOGLOBIN A1C: Hgb A1c MFr Bld: 7.1 % — ABNORMAL HIGH (ref 4.6–6.5)

## 2014-01-31 LAB — LIPID PANEL
Cholesterol: 117 mg/dL (ref 0–200)
HDL: 41.1 mg/dL (ref >39.00)
LDL Cholesterol: 52 mg/dL (ref 0–99)
NONHDL: 75.9
Total CHOL/HDL Ratio: 3
Triglycerides: 120 mg/dL (ref 0.0–149.0)
VLDL: 24 mg/dL (ref 0.0–40.0)

## 2014-01-31 LAB — VITAMIN D 25 HYDROXY (VIT D DEFICIENCY, FRACTURES): VITD: 29.43 ng/mL — ABNORMAL LOW (ref 30.00–100.00)

## 2014-01-31 NOTE — Telephone Encounter (Signed)
CPX orders

## 2014-02-04 ENCOUNTER — Encounter: Payer: Self-pay | Admitting: Family Medicine

## 2014-02-04 ENCOUNTER — Telehealth: Payer: Self-pay | Admitting: Family Medicine

## 2014-02-04 ENCOUNTER — Ambulatory Visit (INDEPENDENT_AMBULATORY_CARE_PROVIDER_SITE_OTHER): Payer: 59 | Admitting: Family Medicine

## 2014-02-04 VITALS — BP 134/84 | HR 72 | Temp 98.0°F | Ht 62.0 in | Wt 127.1 lb

## 2014-02-04 DIAGNOSIS — E785 Hyperlipidemia, unspecified: Secondary | ICD-10-CM

## 2014-02-04 DIAGNOSIS — M8580 Other specified disorders of bone density and structure, unspecified site: Secondary | ICD-10-CM

## 2014-02-04 DIAGNOSIS — I10 Essential (primary) hypertension: Secondary | ICD-10-CM

## 2014-02-04 DIAGNOSIS — Z23 Encounter for immunization: Secondary | ICD-10-CM

## 2014-02-04 DIAGNOSIS — E119 Type 2 diabetes mellitus without complications: Secondary | ICD-10-CM

## 2014-02-04 DIAGNOSIS — Z0000 Encounter for general adult medical examination without abnormal findings: Secondary | ICD-10-CM

## 2014-02-04 NOTE — Telephone Encounter (Signed)
I need to know when they recommended the next one

## 2014-02-04 NOTE — Telephone Encounter (Signed)
Please let pt know that she was due for colonoscopy in 2013 - if she wants to schedule it please let me know and I will refer

## 2014-02-04 NOTE — Telephone Encounter (Signed)
Candace Newman is past due on her colonscopy.  Amy @ [facility] stated they sent her a letter in 2013 reminding her to schedule

## 2014-02-04 NOTE — Patient Instructions (Signed)
Don't forget to schedule your mammogram in march  Flu shot and prevnar shot today  Your glucose control is worse- start checking some readings in the afternoon or evening  Eat a diabetic diet and stay active  Stop fosamax after you finish what you have Increase vitamin D to 2000 iu once daily  Follow up with me in 3 months with labs prior

## 2014-02-04 NOTE — Telephone Encounter (Signed)
Spoken to patient and inform her that she is due for a colonscopy.  Patient stated she does not want to schedule one at this time.

## 2014-02-04 NOTE — Progress Notes (Signed)
Pre visit review using our clinic review tool, if applicable. No additional management support is needed unless otherwise documented below in the visit note. 

## 2014-02-04 NOTE — Progress Notes (Signed)
Subjective:    Patient ID: Candace Newman, female    DOB: 1940-12-09, 74 y.o.   MRN: [id number]  HPI Here for annual medicare wellness visit and also chronic/acute medical problems   Wt is stable with bmi of 23  I have personally reviewed the Medicare Annual Wellness questionnaire and have noted 1. The patient's medical and social history 2. Their use of alcohol, tobacco or illicit drugs 3. Their current medications and supplements 4. The patient's functional ability including ADL's, fall risks, home safety risks and hearing or visual             impairment. 5. Diet and physical activities 6. Evidence for depression or mood disorders  The patients weight, height, BMI have been recorded in the chart and visual acuity is per eye clinic.  I have made referrals, counseling and provided education to the patient based review of the above and I have provided the pt with a written personalized care plan for preventive services.  Doing ok overall Just had 7 teeth pulled - still recovering -some soreness   See scanned forms.  Routine anticipatory guidance given to patient.  See health maintenance. Colon cancer screening 12/08 polyps - ? Know what her recall was was  Breast cancer screening 3/15 normal , she will schedule her own appt  Self breast exam- no lumps or changes  Flu vaccine -needs that today Tetanus vaccine 3/09 Pneumovax 7/12 , and she will get the prevnar  Zoster vaccine- declines   Advance directive has one written up  Cognitive function addressed- see scanned forms- and if abnormal then additional documentation follows.  No concerns about memory   PMH and SH reviewed  Meds, vitals, and allergies reviewed.   ROS: See HPI.  Otherwise negative.      bp is stable today  No cp or palpitations or headaches or edema  No side effects to medicines  BP Readings from Last 3 Encounters:  02/04/14 134/84  08/02/13 130/78  01/19/13 128/78     DM Lab Results    Component Value Date   HGBA1C 7.1* 01/31/2014  has not noticed change in am glucose readings  Less exercise  Ate worse the holidays  Up from 6.6   Osteopenia Stable dexa 1/15 Fosamax since 2011- time to come off of it  D level is 29- she takes 1000 iu daily  Hyperlipidemia Lab Results  Component Value Date   CHOL 117 01/31/2014   CHOL 114 01/12/2013   CHOL 129 07/14/2012   Lab Results  Component Value Date   HDL 41.10 01/31/2014   HDL 43.30 01/12/2013   HDL 46.10 07/14/2012   Lab Results  Component Value Date   LDLCALC 52 01/31/2014   LDLCALC 53 01/12/2013   LDLCALC 46 07/14/2012   Lab Results  Component Value Date   TRIG 120.0 01/31/2014   TRIG 88.0 01/12/2013   TRIG 185.0* 07/14/2012   Lab Results  Component Value Date   CHOLHDL 3 01/31/2014   CHOLHDL 3 01/12/2013   CHOLHDL 3 07/14/2012   No results found for: LDLDIRECT  Very well controlled with lipitor and diet  Good low fat diet   Patient Active Problem List   Diagnosis Date Noted  . Encounter for Medicare annual wellness exam 01/19/2013  . Personal history of colonic polyps 01/19/2013  . Osteopenia 07/29/2011  . Hyponatremia 02/19/2011  . POSTMENOPAUSAL STATUS 04/14/2007  . Diabetes type 2, controlled 03/12/2007  . Hyperlipidemia 03/12/2007  . GLAUCOMA 03/12/2007  .  Essential hypertension 03/12/2007   Past Medical History  Diagnosis Date  . Diabetes mellitus     type II  . Hyperlipidemia   . Hypertension   . Osteopenia    Past Surgical History  Procedure Laterality Date  . Abdominal hysterectomy      ovaries intact -non cancer done for bleeding   History  Substance Use Topics  . Smoking status: Former Smoker    Quit date: 01/07/2005  . Smokeless tobacco: Not on file  . Alcohol Use: No   Family History  Problem Relation Age of Onset  . Heart disease Mother     CAD  . Hypertension Mother   . Heart disease Father     CAD  . Diabetes Brother   . Cancer Brother     lymph node  CA   No Known Allergies Current Outpatient Prescriptions on File Prior to Visit  Medication Sig Dispense Refill  . ACCU-CHEK AVIVA PLUS test strip TEST SUGAR ONCE DAILY AND AS NEEDED.Marland KitchenDX250.00 100 each 1  . ACCU-CHEK SOFTCLIX LANCETS lancets USE DEVICE TO TEST BLOOD SUGAR ONCE DAILY AND AS NEEDED....DX250.00 100 each 1  . alendronate (FOSAMAX) 70 MG tablet TAKE 1 TABLET BY MOUTH EVERY 7 DAYS WITH A FULL GLASS OF WATER AND ON AN EMPTY STOMACH 4 tablet 5  . atorvastatin (LIPITOR) 20 MG tablet TAKE 1 TABLET EVERY DAY 30 tablet 5  . bisoprolol-hydrochlorothiazide (ZIAC) 5-6.25 MG per tablet TAKE 1 TABLET BY MOUTH DAILY. 30 tablet 5  . Blood Glucose Monitoring Suppl (ACCU-CHEK AVIVA PLUS) W/DEVICE KIT Use device to test blood sugar once daily, and PRN. Dx 250.00 1 kit 0  . latanoprost (XALATAN) 0.005 % ophthalmic solution Use eye drops as directed.     . metFORMIN (GLUCOPHAGE) 500 MG tablet TAKE 1 TABLET BY MOUTH 2 TIMES DAILY WITH A MEAL. 60 tablet 5  . Multiple Vitamin (MULTIVITAMIN) tablet Take 1 tablet by mouth daily.      . ramipril (ALTACE) 5 MG capsule TAKE 1 CAPSULE (5 MG TOTAL) BY MOUTH DAILY. 30 capsule 5  . spironolactone (ALDACTONE) 25 MG tablet TAKE 1 TABLET (25 MG TOTAL) BY MOUTH DAILY. 30 tablet 5  . timolol (BETIMOL) 0.5 % ophthalmic solution Use eye drops as directed.      No current facility-administered medications on file prior to visit.     Review of Systems Review of Systems  Constitutional: Negative for fever, appetite change, fatigue and unexpected weight change.  Eyes: Negative for pain and visual disturbance.  Respiratory: Negative for cough and shortness of breath.   Cardiovascular: Negative for cp or palpitations    Gastrointestinal: Negative for nausea, diarrhea and constipation.  Genitourinary: Negative for urgency and frequency.  Skin: Negative for pallor or rash   Neurological: Negative for weakness, light-headedness, numbness and headaches.  Hematological:  Negative for adenopathy. Does not bruise/bleed easily.  Psychiatric/Behavioral: Negative for dysphoric mood. The patient is not nervous/anxious.         Objective:   Physical Exam  Constitutional: She appears well-developed and well-nourished. No distress.  HENT:  Head: Normocephalic and atraumatic.  Right Ear: External ear normal.  Left Ear: External ear normal.  Mouth/Throat: Oropharynx is clear and moist.  Eyes: Conjunctivae and EOM are normal. Pupils are equal, round, and reactive to light. No scleral icterus.  Neck: Normal range of motion. Neck supple. No JVD present. Carotid bruit is not present. No thyromegaly present.  Cardiovascular: Normal rate, regular rhythm, normal heart sounds and intact distal  pulses.  Exam reveals no gallop.   Pulmonary/Chest: Effort normal and breath sounds normal. No respiratory distress. She has no wheezes. She exhibits no tenderness.  Abdominal: Soft. Bowel sounds are normal. She exhibits no distension, no abdominal bruit and no mass. There is no tenderness.  Genitourinary: No breast swelling, tenderness, discharge or bleeding.  Breast exam: No mass, nodules, thickening, tenderness, bulging, retraction, inflamation, nipple discharge or skin changes noted.  No axillary or clavicular LA.      Musculoskeletal: Normal range of motion. She exhibits no edema or tenderness.  No kyphosis   Lymphadenopathy:    She has no cervical adenopathy.  Neurological: She is alert. She has normal reflexes. No cranial nerve deficit. She exhibits normal muscle tone. Coordination normal.  Skin: Skin is warm and dry. No rash noted. No erythema. No pallor.  Psychiatric: She has a normal mood and affect.          Assessment & Plan:   Problem List Items Addressed This Visit      Cardiovascular and Mediastinum   Essential hypertension - Primary    bp in fair control at this time  BP Readings from Last 1 Encounters:  02/04/14 134/84   No changes needed Disc lifstyle  change with low sodium diet and exercise  Labs reviewed         Endocrine   Diabetes type 2, controlled    Lab Results  Component Value Date   HGBA1C 7.1* 01/31/2014   This is up  inst pt to check glucose more often/ in evening  F/u planned         Musculoskeletal and Integument   Osteopenia     Other   Encounter for Medicare annual wellness exam    Reviewed health habits including diet and exercise and skin cancer prevention Reviewed appropriate screening tests for age  Also reviewed health mt list, fam hx and immunization status , as well as social and family history    Flu and prevnar vaccines today  Pt will schedule her own mammogram Also inc vit D intake  Labs reviewed         Hyperlipidemia    Disc goals for lipids and reasons to control them Rev labs with pt Rev low sat fat diet in detail  Atorvastatin and diet-continue        Other Visit Diagnoses    Need for pneumococcal vaccination        Relevant Orders    Pneumococcal conjugate vaccine 13-valent IM (Completed)

## 2014-02-04 NOTE — Telephone Encounter (Signed)
Spoke to amy @ [facility]

## 2014-02-04 NOTE — Telephone Encounter (Signed)
Dr tower i called [facility]  Ms martins last colonoscopy was Jan/2008

## 2014-02-06 NOTE — Assessment & Plan Note (Signed)
Reviewed health habits including diet and exercise and skin cancer prevention Reviewed appropriate screening tests for age  Also reviewed health mt list, fam hx and immunization status , as well as social and family history    Flu and prevnar vaccines today  Pt will schedule her own mammogram Also inc vit D intake  Labs reviewed

## 2014-02-06 NOTE — Assessment & Plan Note (Addendum)
Disc goals for lipids and reasons to control them Rev labs with pt Rev low sat fat diet in detail  Atorvastatin and diet-continue

## 2014-02-06 NOTE — Assessment & Plan Note (Signed)
bp in fair control at this time  BP Readings from Last 1 Encounters:  02/04/14 134/84   No changes needed Disc lifstyle change with low sodium diet and exercise  Labs reviewed

## 2014-02-06 NOTE — Assessment & Plan Note (Signed)
Lab Results  Component Value Date   HGBA1C 7.1* 01/31/2014   This is up  inst pt to check glucose more often/ in evening  F/u planned

## 2014-02-16 ENCOUNTER — Other Ambulatory Visit: Payer: Self-pay

## 2014-02-16 ENCOUNTER — Other Ambulatory Visit: Payer: Self-pay | Admitting: Family Medicine

## 2014-02-16 DIAGNOSIS — Z1231 Encounter for screening mammogram for malignant neoplasm of breast: Secondary | ICD-10-CM

## 2014-02-22 ENCOUNTER — Other Ambulatory Visit: Payer: Self-pay | Admitting: Family Medicine

## 2014-03-02 ENCOUNTER — Other Ambulatory Visit: Payer: Self-pay | Admitting: Family Medicine

## 2014-03-18 ENCOUNTER — Ambulatory Visit
Admission: RE | Admit: 2014-03-18 | Discharge: 2014-03-18 | Disposition: A | Discharge status: Home / Self Care | Payer: Medicare PPO | Source: Ambulatory Visit

## 2014-03-18 DIAGNOSIS — Z1231 Encounter for screening mammogram for malignant neoplasm of breast: Secondary | ICD-10-CM

## 2014-03-22 ENCOUNTER — Telehealth: Payer: Self-pay

## 2014-03-22 NOTE — Telephone Encounter (Signed)
Patient aware of mammogram results and recommendations. 

## 2014-03-22 NOTE — Telephone Encounter (Signed)
-----   Message from Abner Greenspan, MD sent at 03/21/2014  9:18 PM EDT ----- Mammogram is normal  Please note for flow sheet if you can  Due for next screening mammogram in 1 year

## 2014-05-04 ENCOUNTER — Telehealth: Payer: Self-pay | Admitting: Family Medicine

## 2014-05-04 DIAGNOSIS — I10 Essential (primary) hypertension: Secondary | ICD-10-CM

## 2014-05-04 DIAGNOSIS — E119 Type 2 diabetes mellitus without complications: Secondary | ICD-10-CM

## 2014-05-04 NOTE — Telephone Encounter (Signed)
-----   Message from Ellamae Sia sent at 05/02/2014  2:58 PM EDT ----- Regarding: Lab orders for Thursday, 4.28.16 Lab orders for 3 months

## 2014-05-05 ENCOUNTER — Other Ambulatory Visit (INDEPENDENT_AMBULATORY_CARE_PROVIDER_SITE_OTHER): Payer: Medicare PPO

## 2014-05-05 DIAGNOSIS — E119 Type 2 diabetes mellitus without complications: Secondary | ICD-10-CM | POA: Diagnosis not present

## 2014-05-05 DIAGNOSIS — I10 Essential (primary) hypertension: Secondary | ICD-10-CM

## 2014-05-05 LAB — BASIC METABOLIC PANEL
BUN: 10 mg/dL (ref 6–23)
CALCIUM: 9.9 mg/dL (ref 8.4–10.5)
CHLORIDE: 97 meq/L (ref 96–112)
CO2: 31 mEq/L (ref 19–32)
Creatinine, Ser: 0.8 mg/dL (ref 0.40–1.20)
GFR: 74.48 mL/min (ref >60.00)
Glucose, Bld: 157 mg/dL — ABNORMAL HIGH (ref 70–99)
Potassium: 4.8 mEq/L (ref 3.5–5.1)
Sodium: 132 mEq/L — ABNORMAL LOW (ref 135–145)

## 2014-05-05 LAB — HEMOGLOBIN A1C: HEMOGLOBIN A1C: 6.7 % — AB (ref 4.6–6.5)

## 2014-05-09 ENCOUNTER — Encounter: Payer: Self-pay | Admitting: Family Medicine

## 2014-05-09 ENCOUNTER — Ambulatory Visit (INDEPENDENT_AMBULATORY_CARE_PROVIDER_SITE_OTHER): Payer: Medicare PPO | Admitting: Family Medicine

## 2014-05-09 VITALS — BP 144/90 | HR 76 | Temp 98.4°F | Ht 62.0 in | Wt 123.0 lb

## 2014-05-09 DIAGNOSIS — I10 Essential (primary) hypertension: Secondary | ICD-10-CM | POA: Diagnosis not present

## 2014-05-09 DIAGNOSIS — E119 Type 2 diabetes mellitus without complications: Secondary | ICD-10-CM

## 2014-05-09 NOTE — Assessment & Plan Note (Signed)
Lab Results  Component Value Date   HGBA1C 6.7* 05/05/2014   Improved control with better diet and exercise  Will re check this 6 mo

## 2014-05-09 NOTE — Assessment & Plan Note (Signed)
Pt has not taken bp med yet today, I suspect that is why it is high  Scheduled 3 mo f/u in the afternoon for re check  Pt states it is good at home Disc health habits

## 2014-05-09 NOTE — Progress Notes (Signed)
Subjective:    Patient ID: Candace Newman, female    DOB: 09-Nov-1940, 74 y.o.   MRN: [id number]  HPI Here for f/u of chronic health problems  Has been feeling well   Wt is down 4 lb with bmi of 22 She is eating well   bp is up on first check  today  No cp or palpitations or headaches or edema  No side effects to medicines  BP Readings from Last 3 Encounters:  05/09/14 144/90  02/04/14 134/84  08/02/13 130/78    Has not been up at home - even low at one point - ? Wonder if cuff is accurate  On ziac and altace and aldactone  Has not taken yet today- due to the time of appt     Diabetes - "going the right direction" Home sugar results - anywhere from 90s to low 100s, -never above 160  DM diet - is eating well  Exercise - walking / went on a trail hike last Sunday  Symptoms-none  A1C last  Lab Results  Component Value Date   HGBA1C 6.7* 05/05/2014  down from 7.1 At goal - wonderful   No problems with medications  Renal protection ace  Last eye exam 12/15 ok    Patient Active Problem List   Diagnosis Date Noted  . Encounter for Medicare annual wellness exam 01/19/2013  . Personal history of colonic polyps 01/19/2013  . Osteopenia 07/29/2011  . Hyponatremia 02/19/2011  . POSTMENOPAUSAL STATUS 04/14/2007  . Diabetes type 2, controlled 03/12/2007  . Hyperlipidemia 03/12/2007  . GLAUCOMA 03/12/2007  . Essential hypertension 03/12/2007   Past Medical History  Diagnosis Date  . Diabetes mellitus     type II  . Hyperlipidemia   . Hypertension   . Osteopenia    Past Surgical History  Procedure Laterality Date  . Abdominal hysterectomy      ovaries intact -non cancer done for bleeding   History  Substance Use Topics  . Smoking status: Former Smoker    Quit date: 01/07/2005  . Smokeless tobacco: Not on file  . Alcohol Use: No   Family History  Problem Relation Age of Onset  . Heart disease Mother     CAD  . Hypertension Mother   . Heart disease Father      CAD  . Diabetes Brother   . Cancer Brother     lymph node CA   No Known Allergies Current Outpatient Prescriptions on File Prior to Visit  Medication Sig Dispense Refill  . ACCU-CHEK AVIVA PLUS test strip TEST SUGAR ONCE DAILY AND AS NEEDED.Marland KitchenDX250.00 100 each 1  . ACCU-CHEK SOFTCLIX LANCETS lancets USE DEVICE TO TEST BLOOD SUGAR ONCE DAILY AND AS NEEDED....DX250.00 100 each 1  . amoxicillin (AMOXIL) 875 MG tablet Take 875 mg by mouth 2 (two) times daily.    Marland Kitchen atorvastatin (LIPITOR) 20 MG tablet TAKE 1 TABLET EVERY DAY 30 tablet 5  . bisoprolol-hydrochlorothiazide (ZIAC) 5-6.25 MG per tablet TAKE 1 TABLET EVERY DAY 30 tablet 5  . Blood Glucose Monitoring Suppl (ACCU-CHEK AVIVA PLUS) W/DEVICE KIT Use device to test blood sugar once daily, and PRN. Dx 250.00 1 kit 0  . HYDROcodone-acetaminophen (NORCO) 7.5-325 MG per tablet Take 1 tablet by mouth every 6 (six) hours as needed for moderate pain.    Marland Kitchen latanoprost (XALATAN) 0.005 % ophthalmic solution Use eye drops as directed.     . metFORMIN (GLUCOPHAGE) 500 MG tablet TAKE 1 TABLET BY MOUTH 2 TIMES  DAILY WITH A MEAL. 60 tablet 5  . Multiple Vitamin (MULTIVITAMIN) tablet Take 1 tablet by mouth daily.      . ramipril (ALTACE) 5 MG capsule TAKE 1 CAPSULE (5 MG TOTAL) BY MOUTH DAILY. 30 capsule 5  . spironolactone (ALDACTONE) 25 MG tablet TAKE 1 TABLET BY MOUTH EVERY DAY 30 tablet 5  . timolol (BETIMOL) 0.5 % ophthalmic solution Use eye drops as directed.      No current facility-administered medications on file prior to visit.       Review of Systems Review of Systems  Constitutional: Negative for fever, appetite change, fatigue and unexpected weight change.  Eyes: Negative for pain and visual disturbance.  Respiratory: Negative for cough and shortness of breath.   Cardiovascular: Negative for cp or palpitations    Gastrointestinal: Negative for nausea, diarrhea and constipation.  Genitourinary: Negative for urgency and frequency.    Skin: Negative for pallor or rash   Neurological: Negative for weakness, light-headedness, numbness and headaches.  Hematological: Negative for adenopathy. Does not bruise/bleed easily.  Psychiatric/Behavioral: Negative for dysphoric mood. The patient is not nervous/anxious.         Objective:   Physical Exam  Constitutional: She appears well-developed and well-nourished. No distress.  Slim and well appearing   HENT:  Head: Normocephalic and atraumatic.  Mouth/Throat: Oropharynx is clear and moist.  Eyes: Conjunctivae and EOM are normal. Pupils are equal, round, and reactive to light.  Neck: Normal range of motion. Neck supple. No JVD present. Carotid bruit is not present. No thyromegaly present.  Cardiovascular: Normal rate, regular rhythm, normal heart sounds and intact distal pulses.  Exam reveals no gallop.   Pulmonary/Chest: Effort normal and breath sounds normal. No respiratory distress. She has no wheezes. She has no rales.  No crackles  Abdominal: Soft. Bowel sounds are normal. She exhibits no distension, no abdominal bruit and no mass. There is no tenderness.  Musculoskeletal: She exhibits no edema.  Lymphadenopathy:    She has no cervical adenopathy.  Neurological: She is alert. She has normal reflexes.  Skin: Skin is warm and dry. No rash noted.  Psychiatric: She has a normal mood and affect.          Assessment & Plan:   Problem List Items Addressed This Visit      Cardiovascular and Mediastinum   Essential hypertension - Primary    Pt has not taken bp med yet today, I suspect that is why it is high  Scheduled 3 mo f/u in the afternoon for re check  Pt states it is good at home Disc health habits         Endocrine   Diabetes type 2, controlled    Lab Results  Component Value Date   HGBA1C 6.7* 05/05/2014   Improved control with better diet and exercise  Will re check this 6 mo

## 2014-05-09 NOTE — Patient Instructions (Signed)
Glucose control is better  Eat a snack if you are going to exercise  Drink enough fluids if exercising or outdoors  Follow up in 3 months for a blood pressure visit in the afternoon after you have taken your medicine

## 2014-05-09 NOTE — Progress Notes (Signed)
Pre visit review using our clinic review tool, if applicable. No additional management support is needed unless otherwise documented below in the visit note. 

## 2014-05-10 ENCOUNTER — Telehealth: Payer: Self-pay | Admitting: Family Medicine

## 2014-06-24 LAB — HM DIABETES EYE EXAM

## 2014-07-04 ENCOUNTER — Other Ambulatory Visit: Payer: Self-pay | Admitting: Family Medicine

## 2014-07-04 ENCOUNTER — Encounter: Payer: Self-pay | Admitting: Family Medicine

## 2014-07-15 ENCOUNTER — Other Ambulatory Visit: Payer: Self-pay | Admitting: Family Medicine

## 2014-08-09 ENCOUNTER — Encounter: Payer: Self-pay | Admitting: Family Medicine

## 2014-08-09 ENCOUNTER — Ambulatory Visit (INDEPENDENT_AMBULATORY_CARE_PROVIDER_SITE_OTHER): Payer: Medicare PPO | Admitting: Family Medicine

## 2014-08-09 VITALS — BP 110/78 | HR 68 | Wt 120.0 lb

## 2014-08-09 DIAGNOSIS — E119 Type 2 diabetes mellitus without complications: Secondary | ICD-10-CM

## 2014-08-09 DIAGNOSIS — I10 Essential (primary) hypertension: Secondary | ICD-10-CM

## 2014-08-09 DIAGNOSIS — E785 Hyperlipidemia, unspecified: Secondary | ICD-10-CM

## 2014-08-09 MED ORDER — ATORVASTATIN CALCIUM 20 MG PO TABS: 20.0000 mg | ORAL_TABLET | Freq: Every day | ORAL | Status: DC

## 2014-08-09 MED ORDER — BISOPROLOL-HYDROCHLOROTHIAZIDE 5-6.25 MG PO TABS: 1.0000 | ORAL_TABLET | Freq: Every day | ORAL | Status: DC

## 2014-08-09 MED ORDER — RAMIPRIL 5 MG PO CAPS: ORAL_CAPSULE | ORAL | Status: DC

## 2014-08-09 MED ORDER — METFORMIN HCL 500 MG PO TABS: 500.0000 mg | ORAL_TABLET | Freq: Every day | ORAL | Status: DC

## 2014-08-09 MED ORDER — SPIRONOLACTONE 25 MG PO TABS: 25.0000 mg | ORAL_TABLET | Freq: Every day | ORAL | Status: DC

## 2014-08-09 NOTE — Assessment & Plan Note (Signed)
Much improved after taking medications  bp in fair control at this time  BP Readings from Last 1 Encounters:  08/09/14 110/78   No changes needed Disc lifstyle change with low sodium diet and exercise   F/u 3 mo  Medications refilled today

## 2014-08-09 NOTE — Progress Notes (Signed)
Subjective:    Patient ID: Candace Newman, female    DOB: [birth year], 74 y.o.   MRN: [id number]  HPI Here for f/u of HTN  Last visit bp was high - forgot med that day   Wt is down 2 lb - has been busy  Eating well   BP Readings from Last 3 Encounters:  08/09/14 110/78  05/09/14 144/90  02/04/14 134/84   took her medicine today   Watches sodium in diet also     Chemistry      Component Value Date/Time   NA 132* 05/05/2014 0930   K 4.8 05/05/2014 0930   CL 97 05/05/2014 0930   CO2 31 05/05/2014 0930   BUN 10 05/05/2014 0930   CREATININE 0.80 05/05/2014 0930      Component Value Date/Time   CALCIUM 9.9 05/05/2014 0930   ALKPHOS 64 01/31/2014 0844   AST 15 01/31/2014 0844   ALT 13 01/31/2014 0844   BILITOT 0.6 01/31/2014 0844       Patient Active Problem List   Diagnosis Date Noted  . Encounter for Medicare annual wellness exam 01/19/2013  . Personal history of colonic polyps 01/19/2013  . Osteopenia 07/29/2011  . Hyponatremia 02/19/2011  . POSTMENOPAUSAL STATUS 04/14/2007  . Diabetes type 2, controlled 03/12/2007  . Hyperlipidemia 03/12/2007  . GLAUCOMA 03/12/2007  . Essential hypertension 03/12/2007   Past Medical History  Diagnosis Date  . Diabetes mellitus     type II  . Hyperlipidemia   . Hypertension   . Osteopenia    Past Surgical History  Procedure Laterality Date  . Abdominal hysterectomy      ovaries intact -non cancer done for bleeding   History  Substance Use Topics  . Smoking status: Former Smoker    Quit date: 01/07/2005  . Smokeless tobacco: Not on file  . Alcohol Use: No   Family History  Problem Relation Age of Onset  . Heart disease Mother     CAD  . Hypertension Mother   . Heart disease Father     CAD  . Diabetes Brother   . Cancer Brother     lymph node CA   No Known Allergies Current Outpatient Prescriptions on File Prior to Visit  Medication Sig Dispense Refill  . ACCU-CHEK AVIVA PLUS test strip TEST SUGAR ONCE  DAILY AND AS NEEDED.Marland KitchenDX250.00 100 each PRN  . ACCU-CHEK SOFTCLIX LANCETS lancets USE DEVICE TO TEST BLOOD SUGAR ONCE DAILY AND AS NEEDED....DX E11.9 100 each 0  . HYDROcodone-acetaminophen (NORCO) 7.5-325 MG per tablet Take 1 tablet by mouth every 6 (six) hours as needed for moderate pain.    Marland Kitchen latanoprost (XALATAN) 0.005 % ophthalmic solution Use eye drops as directed.     . Multiple Vitamin (MULTIVITAMIN) tablet Take 1 tablet by mouth daily.       No current facility-administered medications on file prior to visit.    Review of Systems Review of Systems  Constitutional: Negative for fever, appetite change, fatigue and unexpected weight change.  Eyes: Negative for pain and visual disturbance.  Respiratory: Negative for cough and shortness of breath.   Cardiovascular: Negative for cp or palpitations    Gastrointestinal: Negative for nausea, diarrhea and constipation.  Genitourinary: Negative for urgency and frequency.  Skin: Negative for pallor or rash   Neurological: Negative for weakness, light-headedness, numbness and headaches.  Hematological: Negative for adenopathy. Does not bruise/bleed easily.  Psychiatric/Behavioral: Negative for dysphoric mood. The patient is not nervous/anxious.  Objective:   Physical Exam  Constitutional: She appears well-developed and well-nourished. No distress.  Well appearing   HENT:  Head: Normocephalic and atraumatic.  Mouth/Throat: Oropharynx is clear and moist.  Eyes: Conjunctivae and EOM are normal. Pupils are equal, round, and reactive to light.  Neck: Normal range of motion. Neck supple. No JVD present. Carotid bruit is not present. No thyromegaly present.  Cardiovascular: Normal rate, regular rhythm, normal heart sounds and intact distal pulses.  Exam reveals no gallop.   Pulmonary/Chest: Effort normal and breath sounds normal. No respiratory distress. She has no wheezes. She has no rales.  No crackles  Abdominal: Soft. Bowel sounds  are normal. She exhibits no distension, no abdominal bruit and no mass. There is no tenderness.  Musculoskeletal: She exhibits no edema.  Lymphadenopathy:    She has no cervical adenopathy.  Neurological: She is alert. She has normal reflexes.  Skin: Skin is warm and dry. No rash noted.  Psychiatric: She has a normal mood and affect.  Nursing note and vitals reviewed.         Assessment & Plan:   Problem List Items Addressed This Visit    Essential hypertension - Primary    Much improved after taking medications  bp in fair control at this time  BP Readings from Last 1 Encounters:  08/09/14 110/78   No changes needed Disc lifstyle change with low sodium diet and exercise   F/u 3 mo  Medications refilled today      Relevant Medications   spironolactone (ALDACTONE) 25 MG tablet   ramipril (ALTACE) 5 MG capsule   bisoprolol-hydrochlorothiazide (ZIAC) 5-6.25 MG per tablet   atorvastatin (LIPITOR) 20 MG tablet

## 2014-08-09 NOTE — Patient Instructions (Signed)
Blood pressure is better  Continue current medicines Stay active  Follow up in 3 months with labs prior

## 2014-08-09 NOTE — Progress Notes (Signed)
Pre visit review using our clinic review tool, if applicable. No additional management support is needed unless otherwise documented below in the visit note. 

## 2014-08-14 ENCOUNTER — Other Ambulatory Visit: Payer: Self-pay | Admitting: Family Medicine

## 2014-08-22 ENCOUNTER — Other Ambulatory Visit: Payer: Self-pay | Admitting: Family Medicine

## 2014-08-22 NOTE — Telephone Encounter (Signed)
Received refill request from pharmacy for metformin 500 mg bid. Rx for metformin 500mg  QD was sent in at her 08/09/14 ov. Which dose should she be on 1 QD or 1 BID?

## 2014-08-22 NOTE — Telephone Encounter (Signed)
I thought it was QD - but ask pt which it is and then refill for the year Thanks

## 2014-08-23 NOTE — Telephone Encounter (Signed)
Pt said it is once a day and Dr. Glori Bickers sent in a years worth of refills on 08/09/14, Rx declined and pharmacy advise to fill Rx with sig of 1 tab qd

## 2014-10-17 ENCOUNTER — Other Ambulatory Visit: Payer: Self-pay | Admitting: Family Medicine

## 2014-11-02 ENCOUNTER — Other Ambulatory Visit: Payer: Medicare PPO

## 2014-11-09 ENCOUNTER — Ambulatory Visit: Payer: Medicare PPO | Admitting: Family Medicine

## 2014-11-10 ENCOUNTER — Other Ambulatory Visit (INDEPENDENT_AMBULATORY_CARE_PROVIDER_SITE_OTHER): Payer: Medicare PPO

## 2014-11-10 DIAGNOSIS — E119 Type 2 diabetes mellitus without complications: Secondary | ICD-10-CM | POA: Diagnosis not present

## 2014-11-10 DIAGNOSIS — E785 Hyperlipidemia, unspecified: Secondary | ICD-10-CM | POA: Diagnosis not present

## 2014-11-10 DIAGNOSIS — I10 Essential (primary) hypertension: Secondary | ICD-10-CM | POA: Diagnosis not present

## 2014-11-10 LAB — LIPID PANEL
CHOLESTEROL: 110 mg/dL (ref 0–200)
HDL: 42.1 mg/dL (ref >39.00)
LDL Cholesterol: 51 mg/dL (ref 0–99)
NonHDL: 68.37
TRIGLYCERIDES: 89 mg/dL (ref 0.0–149.0)
Total CHOL/HDL Ratio: 3
VLDL: 17.8 mg/dL (ref 0.0–40.0)

## 2014-11-10 LAB — COMPREHENSIVE METABOLIC PANEL
ALBUMIN: 4.4 g/dL (ref 3.5–5.2)
ALT: 10 U/L (ref 0–35)
AST: 13 U/L (ref 0–37)
Alkaline Phosphatase: 70 U/L (ref 39–117)
BUN: 11 mg/dL (ref 6–23)
CALCIUM: 10.1 mg/dL (ref 8.4–10.5)
CHLORIDE: 97 meq/L (ref 96–112)
CO2: 30 meq/L (ref 19–32)
CREATININE: 0.82 mg/dL (ref 0.40–1.20)
GFR: 72.29 mL/min (ref >60.00)
Glucose, Bld: 158 mg/dL — ABNORMAL HIGH (ref 70–99)
Potassium: 4.3 mEq/L (ref 3.5–5.1)
Sodium: 133 mEq/L — ABNORMAL LOW (ref 135–145)
Total Bilirubin: 0.8 mg/dL (ref 0.2–1.2)
Total Protein: 7.1 g/dL (ref 6.0–8.3)

## 2014-11-10 LAB — HEMOGLOBIN A1C: HEMOGLOBIN A1C: 6.4 % (ref 4.6–6.5)

## 2014-11-16 ENCOUNTER — Encounter: Payer: Self-pay | Admitting: Family Medicine

## 2014-11-16 ENCOUNTER — Ambulatory Visit (INDEPENDENT_AMBULATORY_CARE_PROVIDER_SITE_OTHER): Payer: Medicare PPO | Admitting: Family Medicine

## 2014-11-16 VITALS — BP 122/68 | HR 67 | Temp 97.9°F | Ht 62.0 in | Wt 119.8 lb

## 2014-11-16 DIAGNOSIS — E785 Hyperlipidemia, unspecified: Secondary | ICD-10-CM

## 2014-11-16 DIAGNOSIS — Z23 Encounter for immunization: Secondary | ICD-10-CM

## 2014-11-16 DIAGNOSIS — E119 Type 2 diabetes mellitus without complications: Secondary | ICD-10-CM

## 2014-11-16 DIAGNOSIS — I10 Essential (primary) hypertension: Secondary | ICD-10-CM

## 2014-11-16 DIAGNOSIS — E871 Hypo-osmolality and hyponatremia: Secondary | ICD-10-CM

## 2014-11-16 NOTE — Assessment & Plan Note (Signed)
Lab Results  Component Value Date   HGBA1C 6.4 11/10/2014   Continues to improve Has been more active  Rev diet  Had flu shot

## 2014-11-16 NOTE — Assessment & Plan Note (Signed)
Stable/very mild  Continue to follow

## 2014-11-16 NOTE — Progress Notes (Signed)
Pre visit review using our clinic review tool, if applicable. No additional management support is needed unless otherwise documented below in the visit note. 

## 2014-11-16 NOTE — Patient Instructions (Signed)
You are doing great  Keep up the good health habits  Follow up in 6 months for annual exam with labs prior

## 2014-11-16 NOTE — Assessment & Plan Note (Signed)
Great lipid control with statin and diet  Disc goals for lipids and reasons to control them Rev labs with pt Rev low sat fat diet in detail

## 2014-11-16 NOTE — Progress Notes (Signed)
Subjective:    Patient ID: Candace Newman, female    DOB: [birth year], 74 y.o.   MRN: [id number]  HPI Here for f/u of chronic medical problems   Nothing new is doing on  Husband broke his ankle - having to take care of him  That is not easy    bp is stable today  No cp or palpitations or headaches or edema  No side effects to medicines  BP Readings from Last 3 Encounters:  11/16/14 122/68  08/09/14 110/78  05/09/14 144/90     Wt is stable with bmi of 21    Chemistry      Component Value Date/Time   NA 133* 11/10/2014 0934   K 4.3 11/10/2014 0934   CL 97 11/10/2014 0934   CO2 30 11/10/2014 0934   BUN 11 11/10/2014 0934   CREATININE 0.82 11/10/2014 0934      Component Value Date/Time   CALCIUM 10.1 11/10/2014 0934   ALKPHOS 70 11/10/2014 0934   AST 13 11/10/2014 0934   ALT 10 11/10/2014 0934   BILITOT 0.8 11/10/2014 0934     Sodium is stable   Diabetes Home sugar results -no hypoglycemia  DM diet - doing well with that overall  Exercise - getting more exercise caring for her husband-more active - helps with everything/on her feet all day, also housework  Symptoms- none  A1C last  Lab Results  Component Value Date   HGBA1C 6.4 11/10/2014   This is down from 6.7 No problems with medications  Renal protection ace  Last eye exam -June 2016   choelsterol Lab Results  Component Value Date   CHOL 110 11/10/2014   CHOL 117 01/31/2014   CHOL 114 01/12/2013   Lab Results  Component Value Date   HDL 42.10 11/10/2014   HDL 41.10 01/31/2014   HDL 43.30 01/12/2013   Lab Results  Component Value Date   LDLCALC 51 11/10/2014   LDLCALC 52 01/31/2014   LDLCALC 53 01/12/2013   Lab Results  Component Value Date   TRIG 89.0 11/10/2014   TRIG 120.0 01/31/2014   TRIG 88.0 01/12/2013   Lab Results  Component Value Date   CHOLHDL 3 11/10/2014   CHOLHDL 3 01/31/2014   CHOLHDL 3 01/12/2013   No results found for: LDLDIRECT lipitor and diet Doing really  well!  Had her flu shot  Had her pneumonia vaccines   Patient Active Problem List   Diagnosis Date Noted  . Encounter for Medicare annual wellness exam 01/19/2013  . Personal history of colonic polyps 01/19/2013  . Osteopenia 07/29/2011  . Hyponatremia 02/19/2011  . POSTMENOPAUSAL STATUS 04/14/2007  . Diabetes type 2, controlled ([place]) 03/12/2007  . Hyperlipidemia 03/12/2007  . GLAUCOMA 03/12/2007  . Essential hypertension 03/12/2007   Past Medical History  Diagnosis Date  . Diabetes mellitus     type II  . Hyperlipidemia   . Hypertension   . Osteopenia    Past Surgical History  Procedure Laterality Date  . Abdominal hysterectomy      ovaries intact -non cancer done for bleeding   Social History  Substance Use Topics  . Smoking status: Former Smoker    Quit date: 01/07/2005  . Smokeless tobacco: None  . Alcohol Use: No   Family History  Problem Relation Age of Onset  . Heart disease Mother     CAD  . Hypertension Mother   . Heart disease Father     CAD  . Diabetes Brother   .  Cancer Brother     lymph node CA   No Known Allergies Current Outpatient Prescriptions on File Prior to Visit  Medication Sig Dispense Refill  . ACCU-CHEK AVIVA PLUS test strip TEST SUGAR ONCE DAILY AND AS NEEDED.Marland KitchenDX250.00 100 each PRN  . ACCU-CHEK SOFTCLIX LANCETS lancets USE DEVICE TO TEST BLOOD SUGAR ONCE DAILY AND AS NEEDED....DX E11.9 100 each 1  . atorvastatin (LIPITOR) 20 MG tablet Take 1 tablet (20 mg total) by mouth daily. 30 tablet 11  . bisoprolol-hydrochlorothiazide (ZIAC) 5-6.25 MG per tablet Take 1 tablet by mouth daily. 30 tablet 11  . latanoprost (XALATAN) 0.005 % ophthalmic solution Use eye drops as directed.     . metFORMIN (GLUCOPHAGE) 500 MG tablet Take 1 tablet (500 mg total) by mouth daily. 30 tablet 11  . Multiple Vitamin (MULTIVITAMIN) tablet Take 1 tablet by mouth daily.      . ramipril (ALTACE) 5 MG capsule TAKE 1 CAPSULE (5 MG TOTAL) BY MOUTH DAILY. 30 capsule  11  . spironolactone (ALDACTONE) 25 MG tablet Take 1 tablet (25 mg total) by mouth daily. 30 tablet 11  . timolol (TIMOPTIC) 0.5 % ophthalmic solution USE 1 DROP IN BOTH EYES IN THE MORNING  6   No current facility-administered medications on file prior to visit.     Review of Systems    Review of Systems  Constitutional: Negative for fever, appetite change, fatigue and unexpected weight change.  Eyes: Negative for pain and visual disturbance.  Respiratory: Negative for cough and shortness of breath.   Cardiovascular: Negative for cp or palpitations    Gastrointestinal: Negative for nausea, diarrhea and constipation.  Genitourinary: Negative for urgency and frequency.  Skin: Negative for pallor or rash   Neurological: Negative for weakness, light-headedness, numbness and headaches.  Hematological: Negative for adenopathy. Does not bruise/bleed easily.  Psychiatric/Behavioral: Negative for dysphoric mood. The patient is not nervous/anxious.  pos for stressors     Objective:   Physical Exam  Constitutional: She appears well-developed and well-nourished. No distress.  Well appearing   HENT:  Head: Normocephalic and atraumatic.  Mouth/Throat: Oropharynx is clear and moist.  Eyes: Conjunctivae and EOM are normal. Pupils are equal, round, and reactive to light.  Neck: Normal range of motion. Neck supple. No JVD present. Carotid bruit is not present. No thyromegaly present.  Cardiovascular: Normal rate, regular rhythm, normal heart sounds and intact distal pulses.  Exam reveals no gallop.   Pulmonary/Chest: Effort normal and breath sounds normal. No respiratory distress. She has no wheezes. She has no rales.  No crackles  Abdominal: Soft. Bowel sounds are normal. She exhibits no distension, no abdominal bruit and no mass. There is no tenderness.  Musculoskeletal: She exhibits no edema.  Lymphadenopathy:    She has no cervical adenopathy.  Neurological: She is alert. She has normal  reflexes.  Skin: Skin is warm and dry. No rash noted.  Psychiatric: She has a normal mood and affect.          Assessment & Plan:   Problem List Items Addressed This Visit      Cardiovascular and Mediastinum   Essential hypertension     Endocrine   Diabetes type 2, controlled ([place])    Lab Results  Component Value Date   HGBA1C 6.4 11/10/2014   Continues to improve Has been more active  Rev diet  Had flu shot         Other   Hyperlipidemia    Great lipid control with statin  and diet  Disc goals for lipids and reasons to control them Rev labs with pt Rev low sat fat diet in detail        Hyponatremia    Stable/very mild  Continue to follow       Other Visit Diagnoses    Need for influenza vaccination    -  Primary    Relevant Orders    Flu Vaccine QUAD 36+ mos PF IM (Fluarix & Fluzone Quad PF) (Completed)

## 2015-02-28 ENCOUNTER — Other Ambulatory Visit: Payer: Self-pay

## 2015-02-28 DIAGNOSIS — Z1231 Encounter for screening mammogram for malignant neoplasm of breast: Secondary | ICD-10-CM

## 2015-03-20 ENCOUNTER — Ambulatory Visit
Admission: RE | Admit: 2015-03-20 | Discharge: 2015-03-20 | Disposition: A | Discharge status: Home / Self Care | Payer: Medicare Other | Source: Ambulatory Visit

## 2015-03-20 DIAGNOSIS — Z1231 Encounter for screening mammogram for malignant neoplasm of breast: Secondary | ICD-10-CM

## 2015-03-20 LAB — HM MAMMOGRAPHY: HM Mammogram: NORMAL

## 2015-03-22 ENCOUNTER — Encounter: Payer: Self-pay | Admitting: *Deleted

## 2015-05-13 ENCOUNTER — Telehealth: Payer: Self-pay | Admitting: Family Medicine

## 2015-05-13 DIAGNOSIS — E785 Hyperlipidemia, unspecified: Secondary | ICD-10-CM

## 2015-05-13 DIAGNOSIS — E119 Type 2 diabetes mellitus without complications: Secondary | ICD-10-CM

## 2015-05-13 DIAGNOSIS — I10 Essential (primary) hypertension: Secondary | ICD-10-CM

## 2015-05-13 NOTE — Telephone Encounter (Signed)
-----   Message from Ellamae Sia sent at 05/11/2015  4:23 PM EDT ----- Regarding: Lab orders for Thursday, 5.11.17 Patient is scheduled for CPX labs, please order future labs, Thanks , Karna Christmas

## 2015-05-18 ENCOUNTER — Other Ambulatory Visit (INDEPENDENT_AMBULATORY_CARE_PROVIDER_SITE_OTHER): Payer: Medicare Other

## 2015-05-18 DIAGNOSIS — E119 Type 2 diabetes mellitus without complications: Secondary | ICD-10-CM

## 2015-05-18 DIAGNOSIS — E785 Hyperlipidemia, unspecified: Secondary | ICD-10-CM | POA: Diagnosis not present

## 2015-05-18 DIAGNOSIS — I10 Essential (primary) hypertension: Secondary | ICD-10-CM

## 2015-05-18 LAB — CBC WITH DIFFERENTIAL/PLATELET
BASOS ABS: 0 10*3/uL (ref 0.0–0.1)
Basophils Relative: 0.6 % (ref 0.0–3.0)
EOS ABS: 0.1 10*3/uL (ref 0.0–0.7)
Eosinophils Relative: 1.3 % (ref 0.0–5.0)
HCT: 42.1 % (ref 36.0–46.0)
Hemoglobin: 14.1 g/dL (ref 12.0–15.0)
LYMPHS ABS: 1.3 10*3/uL (ref 0.7–4.0)
Lymphocytes Relative: 19.9 % (ref 12.0–46.0)
MCHC: 33.6 g/dL (ref 30.0–36.0)
MCV: 90.1 fl (ref 78.0–100.0)
MONO ABS: 0.5 10*3/uL (ref 0.1–1.0)
MONOS PCT: 8.1 % (ref 3.0–12.0)
NEUTROS ABS: 4.6 10*3/uL (ref 1.4–7.7)
NEUTROS PCT: 70.1 % (ref 43.0–77.0)
PLATELETS: 247 10*3/uL (ref 150.0–400.0)
RBC: 4.67 Mil/uL (ref 3.87–5.11)
RDW: 12.9 % (ref 11.5–15.5)
WBC: 6.5 10*3/uL (ref 4.0–10.5)

## 2015-05-18 LAB — LIPID PANEL
CHOL/HDL RATIO: 3
Cholesterol: 113 mg/dL (ref 0–200)
HDL: 39.6 mg/dL (ref >39.00)
LDL Cholesterol: 59 mg/dL (ref 0–99)
NonHDL: 73.59
Triglycerides: 72 mg/dL (ref 0.0–149.0)
VLDL: 14.4 mg/dL (ref 0.0–40.0)

## 2015-05-18 LAB — COMPREHENSIVE METABOLIC PANEL
ALK PHOS: 68 U/L (ref 39–117)
ALT: 9 U/L (ref 0–35)
AST: 12 U/L (ref 0–37)
Albumin: 4.4 g/dL (ref 3.5–5.2)
BILIRUBIN TOTAL: 0.8 mg/dL (ref 0.2–1.2)
BUN: 12 mg/dL (ref 6–23)
CO2: 29 meq/L (ref 19–32)
Calcium: 9.8 mg/dL (ref 8.4–10.5)
Chloride: 95 mEq/L — ABNORMAL LOW (ref 96–112)
Creatinine, Ser: 0.85 mg/dL (ref 0.40–1.20)
GFR: 69.26 mL/min (ref >60.00)
GLUCOSE: 139 mg/dL — AB (ref 70–99)
Potassium: 4.8 mEq/L (ref 3.5–5.1)
SODIUM: 131 meq/L — AB (ref 135–145)
TOTAL PROTEIN: 6.8 g/dL (ref 6.0–8.3)

## 2015-05-18 LAB — HEMOGLOBIN A1C: Hgb A1c MFr Bld: 6.6 % — ABNORMAL HIGH (ref 4.6–6.5)

## 2015-05-18 LAB — TSH: TSH: 2.43 u[IU]/mL (ref 0.35–4.50)

## 2015-05-27 LAB — HM DIABETES EYE EXAM

## 2015-05-30 ENCOUNTER — Encounter: Payer: Self-pay | Admitting: Family Medicine

## 2015-05-30 ENCOUNTER — Ambulatory Visit (INDEPENDENT_AMBULATORY_CARE_PROVIDER_SITE_OTHER): Payer: Medicare Other | Admitting: Family Medicine

## 2015-05-30 VITALS — BP 122/66 | HR 62 | Temp 97.7°F | Ht 61.0 in | Wt 113.5 lb

## 2015-05-30 DIAGNOSIS — Z0000 Encounter for general adult medical examination without abnormal findings: Secondary | ICD-10-CM | POA: Diagnosis not present

## 2015-05-30 DIAGNOSIS — E785 Hyperlipidemia, unspecified: Secondary | ICD-10-CM

## 2015-05-30 DIAGNOSIS — I10 Essential (primary) hypertension: Secondary | ICD-10-CM

## 2015-05-30 DIAGNOSIS — M8580 Other specified disorders of bone density and structure, unspecified site: Secondary | ICD-10-CM | POA: Diagnosis not present

## 2015-05-30 DIAGNOSIS — E119 Type 2 diabetes mellitus without complications: Secondary | ICD-10-CM | POA: Diagnosis not present

## 2015-05-30 DIAGNOSIS — R634 Abnormal weight loss: Secondary | ICD-10-CM

## 2015-05-30 DIAGNOSIS — E871 Hypo-osmolality and hyponatremia: Secondary | ICD-10-CM

## 2015-05-30 DIAGNOSIS — Z86010 Personal history of colonic polyps: Secondary | ICD-10-CM

## 2015-05-30 DIAGNOSIS — E2839 Other primary ovarian failure: Secondary | ICD-10-CM

## 2015-05-30 MED ORDER — RAMIPRIL 5 MG PO CAPS: ORAL_CAPSULE | ORAL | Status: DC

## 2015-05-30 MED ORDER — METFORMIN HCL 500 MG PO TABS: 500.0000 mg | ORAL_TABLET | Freq: Every day | ORAL | Status: DC

## 2015-05-30 MED ORDER — BISOPROLOL-HYDROCHLOROTHIAZIDE 5-6.25 MG PO TABS: 1.0000 | ORAL_TABLET | Freq: Every day | ORAL | Status: DC

## 2015-05-30 MED ORDER — ATORVASTATIN CALCIUM 20 MG PO TABS: 20.0000 mg | ORAL_TABLET | Freq: Every day | ORAL | Status: DC

## 2015-05-30 MED ORDER — SPIRONOLACTONE 25 MG PO TABS: 25.0000 mg | ORAL_TABLET | Freq: Every day | ORAL | Status: DC

## 2015-05-30 NOTE — Progress Notes (Signed)
Subjective:    Patient ID: Candace Newman, female    DOB: February 27, 1940, 75 y.o.   MRN: [id number]  HPI Here for health maintenance exam and to review chronic medical problems    Nothing new going on - feels good   Needs to schedule AMW with Riley Lam is down 6 lb with bmi of 21 She thinks she eats well overall - tries not to skip meals However- is eating smaller portions -thinks she gets full faster   Used to smoke (quit in 2007)- smoked for 10 years   Colonoscopy 08-polyp Prev declined 5 y f/u  Wants to do the cologuard test   Mammogram 3/17 nl Self breast exam-no lumps   dexa 1/15 osteopenia Took 5 year course of fosamax Fall hx - she had one stumble when her foot fell asleep - getting up quickly at home/no injury  Fracture hx -none  Ca and D -takes regularly    Complete on immunizations -declines zoster vaccine   Hx of hyponatremia Na is 131- is on hctz and aldactone   bp is stable today  No cp or palpitations or headaches or edema  No side effects to medicines  BP Readings from Last 3 Encounters:  05/30/15 122/66  11/16/14 122/68  08/09/14 110/78     DM2 Lab Results  Component Value Date   HGBA1C 6.6* 05/18/2015  she eats a healthy diet - watches sweets and carbs  Does some exercise indoors (walks and runs in place and uses a stair stepper machine) This is up from 6.4 Eye exam due in 6/17 On ace for renal protection   Hyperlipidemia Lab Results  Component Value Date   CHOL 113 05/18/2015   CHOL 110 11/10/2014   CHOL 117 01/31/2014   Lab Results  Component Value Date   HDL 39.60 05/18/2015   HDL 42.10 11/10/2014   HDL 41.10 01/31/2014   Lab Results  Component Value Date   LDLCALC 59 05/18/2015   LDLCALC 51 11/10/2014   LDLCALC 52 01/31/2014   Lab Results  Component Value Date   TRIG 72.0 05/18/2015   TRIG 89.0 11/10/2014   TRIG 120.0 01/31/2014   Lab Results  Component Value Date   CHOLHDL 3 05/18/2015   CHOLHDL 3 11/10/2014   CHOLHDL 3 01/31/2014   No results found for: LDLDIRECT Atorvastatin and diet  HDL is down a bit  Eats fish and exercises  Tuna and salmon    Patient Active Problem List   Diagnosis Date Noted  . Routine general medical examination at a health care facility 05/30/2015  . Loss of weight 05/30/2015  . Estrogen deficiency 05/30/2015  . Encounter for Medicare annual wellness exam 01/19/2013  . Personal history of colonic polyps 01/19/2013  . Osteopenia 07/29/2011  . Hyponatremia 02/19/2011  . POSTMENOPAUSAL STATUS 04/14/2007  . Diabetes type 2, controlled ([place]) 03/12/2007  . Hyperlipidemia 03/12/2007  . GLAUCOMA 03/12/2007  . Essential hypertension 03/12/2007   Past Medical History  Diagnosis Date  . Diabetes mellitus     type II  . Hyperlipidemia   . Hypertension   . Osteopenia    Past Surgical History  Procedure Laterality Date  . Abdominal hysterectomy      ovaries intact -non cancer done for bleeding   Social History  Substance Use Topics  . Smoking status: Former Smoker    Quit date: 01/07/2005  . Smokeless tobacco: None  . Alcohol Use: No   Family History  Problem Relation Age  of Onset  . Heart disease Mother     CAD  . Hypertension Mother   . Heart disease Father     CAD  . Diabetes Brother   . Cancer Brother     lymph node CA   No Known Allergies Current Outpatient Prescriptions on File Prior to Visit  Medication Sig Dispense Refill  . ACCU-CHEK AVIVA PLUS test strip TEST SUGAR ONCE DAILY AND AS NEEDED.Marland KitchenDX250.00 100 each PRN  . ACCU-CHEK SOFTCLIX LANCETS lancets USE DEVICE TO TEST BLOOD SUGAR ONCE DAILY AND AS NEEDED....DX E11.9 100 each 1  . latanoprost (XALATAN) 0.005 % ophthalmic solution Use eye drops as directed.     . Multiple Vitamin (MULTIVITAMIN) tablet Take 1 tablet by mouth daily.      . timolol (TIMOPTIC) 0.5 % ophthalmic solution USE 1 DROP IN BOTH EYES IN THE MORNING  6   No current facility-administered medications on file prior to  visit.    Review of Systems Review of Systems  Constitutional: Negative for fever, appetite change, fatigue and unexpected weight change.  Eyes: Negative for pain and visual disturbance.  Respiratory: Negative for cough and shortness of breath.   Cardiovascular: Negative for cp or palpitations    Gastrointestinal: Negative for nausea, diarrhea and constipation.  Genitourinary: Negative for urgency and frequency.  Skin: Negative for pallor or rash   Neurological: Negative for weakness, light-headedness, numbness and headaches.  Hematological: Negative for adenopathy. Does not bruise/bleed easily.  Psychiatric/Behavioral: Negative for dysphoric mood. The patient is not nervous/anxious.         Objective:   Physical Exam  Constitutional: She appears well-developed and well-nourished. No distress.  Well appearing   HENT:  Head: Normocephalic and atraumatic.  Right Ear: External ear normal.  Left Ear: External ear normal.  Mouth/Throat: Oropharynx is clear and moist.  Eyes: Conjunctivae and EOM are normal. Pupils are equal, round, and reactive to light. No scleral icterus.  Neck: Normal range of motion. Neck supple. No JVD present. Carotid bruit is not present. No thyromegaly present.  Cardiovascular: Normal rate, regular rhythm, normal heart sounds and intact distal pulses.  Exam reveals no gallop.   Pulmonary/Chest: Effort normal and breath sounds normal. No respiratory distress. She has no wheezes. She exhibits no tenderness.  Abdominal: Soft. Bowel sounds are normal. She exhibits no distension, no abdominal bruit and no mass. There is no tenderness.  Genitourinary: No breast swelling, tenderness, discharge or bleeding.  Breast exam: No mass, nodules, thickening, tenderness, bulging, retraction, inflamation, nipple discharge or skin changes noted.  No axillary or clavicular LA.      Musculoskeletal: Normal range of motion. She exhibits no edema or tenderness.  Petite frame Mild  kyphosis  Lymphadenopathy:    She has no cervical adenopathy.  Neurological: She is alert. She has normal reflexes. No cranial nerve deficit. She exhibits normal muscle tone. Coordination normal.  Skin: Skin is warm and dry. No rash noted. No erythema. No pallor.  Some SKs and solar lentigines  Psychiatric: She has a normal mood and affect.          Assessment & Plan:   Problem List Items Addressed This Visit      Cardiovascular and Mediastinum   Essential hypertension    bp in fair control at this time  BP Readings from Last 1 Encounters:  05/30/15 122/66   No changes needed Disc lifstyle change with low sodium diet and exercise  Labs reviewed  Watching sodium level  Relevant Medications   spironolactone (ALDACTONE) 25 MG tablet   ramipril (ALTACE) 5 MG capsule   bisoprolol-hydrochlorothiazide (ZIAC) 5-6.25 MG tablet   atorvastatin (LIPITOR) 20 MG tablet     Endocrine   Diabetes type 2, controlled ([facility])    Fairly stable and well controlled Lab Results  Component Value Date   HGBA1C 6.6* 05/18/2015   Disc low glycemic diet  Will add more protein in light of wt loss/ smaller portions  Enc activity      Relevant Medications   ramipril (ALTACE) 5 MG capsule   metFORMIN (GLUCOPHAGE) 500 MG tablet   bisoprolol-hydrochlorothiazide (ZIAC) 5-6.25 MG tablet   atorvastatin (LIPITOR) 20 MG tablet     Musculoskeletal and Integument   Osteopenia    Off fosamax now No fractures On ca and D dexa scheduled         Other   Routine general medical examination at a health care facility - Primary    Reviewed health habits including diet and exercise and skin cancer prevention Reviewed appropriate screening tests for age  Also reviewed health mt list, fam hx and immunization status , as well as social and family history   Will set up AMW visit this summer  Labs rev See HPI I think you are due for an eye exam in June  Stop at check out to get your medicare appt  with the nurse  Please do the cologuard test for colon screening  We will do a referral for bone density test at check out  Decrease water intake a little since sodium level is mildly low  No change in medicines  Get a snack with protein a few times per day (like peanut butter)      Personal history of colonic polyps    Declines colonoscopy Will do cologuard for screening this year       Loss of weight    Gradual after last 1-2 y Pt states she eats much smaller portions than she used to as she gets older Enc to add 2 snacks per day with protein (like peanut butter) Continue to follow Also cologuard for colon screen (declines colonoscopy)       Hyponatremia    Sodium of 131  On 2 diuretics- if this gets lower will cut dose  Adv to cut water intake by 1-2 servings per day No symptoms       Hyperlipidemia    Disc goals for lipids and reasons to control them Rev labs with pt Rev low sat fat diet in detail Continue atorvastatin and diet       Relevant Medications   spironolactone (ALDACTONE) 25 MG tablet   ramipril (ALTACE) 5 MG capsule   bisoprolol-hydrochlorothiazide (ZIAC) 5-6.25 MG tablet   atorvastatin (LIPITOR) 20 MG tablet   Estrogen deficiency   Relevant Orders   DG Bone Density

## 2015-05-30 NOTE — Assessment & Plan Note (Signed)
Gradual after last 1-2 y Pt states she eats much smaller portions than she used to as she gets older Enc to add 2 snacks per day with protein (like peanut butter) Continue to follow Also cologuard for colon screen (declines colonoscopy)

## 2015-05-30 NOTE — Assessment & Plan Note (Signed)
Sodium of 131  On 2 diuretics- if this gets lower will cut dose  Adv to cut water intake by 1-2 servings per day No symptoms

## 2015-05-30 NOTE — Assessment & Plan Note (Signed)
bp in fair control at this time  BP Readings from Last 1 Encounters:  05/30/15 122/66   No changes needed Disc lifstyle change with low sodium diet and exercise  Labs reviewed  Watching sodium level

## 2015-05-30 NOTE — Assessment & Plan Note (Signed)
Fairly stable and well controlled Lab Results  Component Value Date   HGBA1C 6.6* 05/18/2015   Disc low glycemic diet  Will add more protein in light of wt loss/ smaller portions  Enc activity

## 2015-05-30 NOTE — Assessment & Plan Note (Signed)
Off fosamax now No fractures On ca and D dexa scheduled

## 2015-05-30 NOTE — Assessment & Plan Note (Signed)
Disc goals for lipids and reasons to control them Rev labs with pt Rev low sat fat diet in detail Continue atorvastatin and diet  

## 2015-05-30 NOTE — Assessment & Plan Note (Signed)
Declines colonoscopy Will do cologuard for screening this year

## 2015-05-30 NOTE — Assessment & Plan Note (Signed)
Reviewed health habits including diet and exercise and skin cancer prevention Reviewed appropriate screening tests for age  Also reviewed health mt list, fam hx and immunization status , as well as social and family history   Will set up AMW visit this summer  Labs rev See HPI I think you are due for an eye exam in June  Stop at check out to get your medicare appt with the nurse  Please do the cologuard test for colon screening  We will do a referral for bone density test at check out  Decrease water intake a little since sodium level is mildly low  No change in medicines  Get a snack with protein a few times per day (like peanut butter)

## 2015-05-30 NOTE — Progress Notes (Signed)
Pre visit review using our clinic review tool, if applicable. No additional management support is needed unless otherwise documented below in the visit note. 

## 2015-05-30 NOTE — Patient Instructions (Signed)
I think you are due for an eye exam in June  Stop at check out to get your medicare appt with the nurse  Please do the cologuard test for colon screening  We will do a referral for bone density test at check out  Decrease water intake a little since sodium level is mildly low  No change in medicines  Get a snack with protein a few times per day (like peanut butter)  Follow up in 6 months with lab prior

## 2015-06-19 ENCOUNTER — Other Ambulatory Visit: Payer: Medicare Other

## 2015-06-20 ENCOUNTER — Other Ambulatory Visit: Payer: Self-pay | Admitting: Family Medicine

## 2015-06-20 ENCOUNTER — Ambulatory Visit (INDEPENDENT_AMBULATORY_CARE_PROVIDER_SITE_OTHER)
Admission: RE | Admit: 2015-06-20 | Discharge: 2015-06-20 | Disposition: A | Discharge status: Home / Self Care | Payer: Medicare Other | Source: Ambulatory Visit | Attending: Family Medicine | Admitting: Family Medicine

## 2015-06-20 DIAGNOSIS — E2839 Other primary ovarian failure: Secondary | ICD-10-CM | POA: Diagnosis not present

## 2015-06-20 LAB — HM DEXA SCAN

## 2015-06-24 LAB — COLOGUARD: COLOGUARD: POSITIVE

## 2015-06-27 ENCOUNTER — Encounter: Payer: Self-pay | Admitting: *Deleted

## 2015-06-27 ENCOUNTER — Telehealth: Payer: Self-pay

## 2015-06-27 DIAGNOSIS — R195 Other fecal abnormalities: Secondary | ICD-10-CM

## 2015-06-27 NOTE — Telephone Encounter (Signed)
Pt notified of cologuard results and Dr. Marliss Coots comments. Pt advise me that she does not want to have a colonoscopy, pt said she has doc appts and a husband that's about to have eye surgery and she doesn't want to have a colonoscopy done. Pt advise of results again and I asked her to call us back if she changes her mind and decides she wants to proceed with the colonoscopy, results sent to scanning

## 2015-06-27 NOTE — Telephone Encounter (Signed)
Please send her a copy of the report and a letter indicating that if she changes her mind about a colonoscopy to please let us know and we will arrange it Thanks

## 2015-06-27 NOTE — Telephone Encounter (Signed)
Please let her know the cologuard test was positive -this could indicate a problem such as a colon cancer or more likely precancerous polyp  I would like to refer her for a colonoscopy-please see if agreeable

## 2015-06-27 NOTE — Telephone Encounter (Signed)
Letter mailed with cologuard results

## 2015-06-27 NOTE — Telephone Encounter (Signed)
Leigh ann with [facility] left v/m to verify Dr Glori Bickers received colo guard result for order # [id number]; [place] said did receive result and was in Dr Alba Cory in box. FYI to Dr Glori Bickers.

## 2015-06-28 ENCOUNTER — Encounter: Payer: Self-pay | Admitting: *Deleted

## 2015-06-30 ENCOUNTER — Encounter: Payer: Self-pay | Admitting: Gastroenterology

## 2015-06-30 DIAGNOSIS — R195 Other fecal abnormalities: Secondary | ICD-10-CM | POA: Insufficient documentation

## 2015-06-30 NOTE — Telephone Encounter (Signed)
Candace Newman pts granddaughter (DPR not signed) said pt did not understand conversation on 06/27/15. I called pt and pt gave me permission to speak with Candace Newman. I advised Candace Newman about cologuard test results from 06/27/15. Candace Newman said to go ahead and schedule colonoscopy; Candace Newman will be the one taking her and asked cb to [facility] when appt is made.

## 2015-06-30 NOTE — Addendum Note (Signed)
Addended by: Loura Pardon A on: 06/30/2015 01:00 PM   Modules accepted: Orders

## 2015-06-30 NOTE — Telephone Encounter (Signed)
Thanks for letting me know  I will refer her and route to [facility]

## 2015-08-08 ENCOUNTER — Other Ambulatory Visit: Payer: Self-pay | Admitting: Family Medicine

## 2015-08-21 ENCOUNTER — Other Ambulatory Visit: Payer: Self-pay | Admitting: Family Medicine

## 2015-09-05 ENCOUNTER — Ambulatory Visit (INDEPENDENT_AMBULATORY_CARE_PROVIDER_SITE_OTHER): Payer: Medicare Other | Admitting: Gastroenterology

## 2015-09-05 ENCOUNTER — Encounter: Payer: Self-pay | Admitting: Gastroenterology

## 2015-09-05 ENCOUNTER — Encounter (INDEPENDENT_AMBULATORY_CARE_PROVIDER_SITE_OTHER): Payer: Self-pay

## 2015-09-05 VITALS — BP 110/76 | HR 68 | Ht 61.5 in | Wt 113.4 lb

## 2015-09-05 DIAGNOSIS — R195 Other fecal abnormalities: Secondary | ICD-10-CM

## 2015-09-05 MED ORDER — NA SULFATE-K SULFATE-MG SULF 17.5-3.13-1.6 GM/177ML PO SOLN: 1.0000 | Freq: Once | ORAL | 0 refills | Status: AC

## 2015-09-05 NOTE — Progress Notes (Signed)
HPI: This is a   very pleasant 75 year old woman  who was referred to me by Abner Greenspan, MD  to evaluate  positive Colo guard test .    Chief complaint is positive Colo guard test, also personal history of adenomatous colon polyp 2008  I did a colonoscopy for her in 2008 for minor hematochezia. Found a single small tubular adenoma and recommended repeat colonoscopy for surveillance in 5 years. We sent her a letter to remind her about that however she never responded. Recently she was in her primary care office and she declined colonoscopy and instead wanted Colo guard screening test. She completed the Colo guard test and it was positive.   CBC 2 months ago was normal   Has lost weight, 4-5 pounds in past several months.  No gi symptoms; no change in her bowels, no overt GI bleeding  No abd pains.  Review of systems: Pertinent positive and negative review of systems were noted in the above HPI section. Complete review of systems was performed and was otherwise normal.   Past Medical History:  Diagnosis Date  . Diabetes mellitus    type II  . Hyperlipidemia   . Hypertension   . Osteopenia     Past Surgical History:  Procedure Laterality Date  . ABDOMINAL HYSTERECTOMY     ovaries intact -non cancer done for bleeding    Current Outpatient Prescriptions  Medication Sig Dispense Refill  . ACCU-CHEK SOFTCLIX LANCETS lancets USE DEVICE TO TEST BLOOD SUGAR ONCE DAILY AND AS NEEDED....DX E11.9 100 each 1  . atorvastatin (LIPITOR) 20 MG tablet Take 1 tablet (20 mg total) by mouth daily. 30 tablet 11  . bisoprolol-hydrochlorothiazide (ZIAC) 5-6.25 MG tablet Take 1 tablet by mouth daily. 30 tablet 11  . glucose blood (ACCU-CHEK AVIVA PLUS) test strip TEST BLOOD SUGAR ONCE DAILY AND AS NEEDED (DX. E11.9) 100 each 0  . latanoprost (XALATAN) 0.005 % ophthalmic solution Use eye drops as directed.     . metFORMIN (GLUCOPHAGE) 500 MG tablet Take 1 tablet (500 mg total) by mouth daily. 30  tablet 11  . Multiple Vitamin (MULTIVITAMIN) tablet Take 1 tablet by mouth daily.      . ramipril (ALTACE) 5 MG capsule TAKE 1 CAPSULE (5 MG TOTAL) BY MOUTH DAILY. 30 capsule 11  . spironolactone (ALDACTONE) 25 MG tablet Take 1 tablet (25 mg total) by mouth daily. 30 tablet 11  . timolol (TIMOPTIC) 0.5 % ophthalmic solution USE 1 DROP IN BOTH EYES IN THE MORNING  6   No current facility-administered medications for this visit.     Allergies as of 09/05/2015  . (No Known Allergies)    Family History  Problem Relation Age of Onset  . Heart disease Mother     CAD  . Hypertension Mother   . Heart disease Father     CAD  . Diabetes Brother   . Colon cancer Brother   . Cancer Brother     lymph node CA    Social History   Social History  . Marital status: Married    Spouse name: N/A  . Number of children: N/A  . Years of education: N/A   Occupational History  . Not on file.   Social History Main Topics  . Smoking status: Current Every Day Smoker    Types: Cigarettes    Last attempt to quit: 01/07/2005  . Smokeless tobacco: Never Used  . Alcohol use No  . Drug use: No  .  Sexual activity: Not on file   Other Topics Concern  . Not on file   Social History Narrative  . No narrative on file     Physical Exam: BP 110/76 (BP Location: Left Arm, Patient Position: Sitting, Cuff Size: Normal)   Pulse 68   Ht 5' 1.5" (1.562 m)   Wt 113 lb 6.4 oz (51.4 kg)   BMI 21.08 kg/m  Constitutional: generally well-appearing Psychiatric: alert and oriented x3 Eyes: extraocular movements intact Mouth: oral pharynx moist, no lesions Neck: supple no lymphadenopathy Cardiovascular: heart regular rate and rhythm Lungs: clear to auscultation bilaterally Abdomen: soft, nontender, nondistended, no obvious ascites, no peritoneal signs, normal bowel sounds Extremities: no lower extremity edema bilaterally Skin: no lesions on visible extremities   Assessment and plan: 75 y.o. female  with  Positive Colo guard test, personal history of adenomatous colon polyp 2008  I recommended we proceed with colonoscopy at her soonest convenience. I see no reason for any further blood tests or imaging studies prior to then.   Owens Loffler, MD [facility] 09/05/2015, 11:26 AM  Cc: [name], [name], MD

## 2015-09-05 NOTE — Patient Instructions (Addendum)
You have been given a separate informational sheet regarding your tobacco use, the importance of quitting and local resources to help you quit. You will be set up for a colonoscopy for + cologuard test.

## 2015-09-07 ENCOUNTER — Other Ambulatory Visit: Payer: Self-pay | Admitting: Family Medicine

## 2015-09-10 ENCOUNTER — Other Ambulatory Visit: Payer: Self-pay | Admitting: Family Medicine

## 2015-09-13 ENCOUNTER — Other Ambulatory Visit: Payer: Self-pay | Admitting: Family Medicine

## 2015-10-03 ENCOUNTER — Ambulatory Visit (AMBULATORY_SURGERY_CENTER): Payer: Medicare Other | Admitting: Gastroenterology

## 2015-10-03 ENCOUNTER — Encounter: Payer: Self-pay | Admitting: Gastroenterology

## 2015-10-03 VITALS — BP 125/62 | HR 71 | Temp 98.9°F | Resp 24 | Ht 61.5 in | Wt 113.0 lb

## 2015-10-03 DIAGNOSIS — D124 Benign neoplasm of descending colon: Secondary | ICD-10-CM | POA: Diagnosis not present

## 2015-10-03 DIAGNOSIS — D120 Benign neoplasm of cecum: Secondary | ICD-10-CM

## 2015-10-03 DIAGNOSIS — D122 Benign neoplasm of ascending colon: Secondary | ICD-10-CM

## 2015-10-03 DIAGNOSIS — R195 Other fecal abnormalities: Secondary | ICD-10-CM

## 2015-10-03 DIAGNOSIS — Z86010 Personal history of colonic polyps: Secondary | ICD-10-CM

## 2015-10-03 MED ORDER — SODIUM CHLORIDE 0.9 % IV SOLN: 500.0000 mL | INTRAVENOUS | Status: AC

## 2015-10-03 NOTE — Patient Instructions (Signed)
YOU HAD AN ENDOSCOPIC PROCEDURE TODAY AT THE [facility]:   Refer to the procedure report that was given to you for any specific questions about what was found during the examination.  If the procedure report does not answer your questions, please call your gastroenterologist to clarify.  If you requested that your care partner not be given the details of your procedure findings, then the procedure report has been included in a sealed envelope for you to review at your convenience later.  YOU SHOULD EXPECT: Some feelings of bloating in the abdomen. Passage of more gas than usual.  Walking can help get rid of the air that was put into your GI tract during the procedure and reduce the bloating. If you had a lower endoscopy (such as a colonoscopy or flexible sigmoidoscopy) you may notice spotting of blood in your stool or on the toilet paper. If you underwent a bowel prep for your procedure, you may not have a normal bowel movement for a few days.  Please Note:  You might notice some irritation and congestion in your nose or some drainage.  This is from the oxygen used during your procedure.  There is no need for concern and it should clear up in a day or so.  SYMPTOMS TO REPORT IMMEDIATELY:   Following lower endoscopy (colonoscopy or flexible sigmoidoscopy):  Excessive amounts of blood in the stool  Significant tenderness or worsening of abdominal pains  Swelling of the abdomen that is new, acute  Fever of 100F or higher  For urgent or emergent issues, a gastroenterologist can be reached at any hour by calling [phone].   DIET:  We do recommend a small meal at first, but then you may proceed to your regular diet.  Drink plenty of fluids but you should avoid alcoholic beverages for 24 hours.  ACTIVITY:  You should plan to take it easy for the rest of today and you should NOT DRIVE or use heavy machinery until tomorrow (because of the sedation medicines used during the test).     FOLLOW UP: Our staff will call the number listed on your records the next business day following your procedure to check on you and address any questions or concerns that you may have regarding the information given to you following your procedure. If we do not reach you, we will leave a message.  However, if you are feeling well and you are not experiencing any problems, there is no need to return our call.  We will assume that you have returned to your regular daily activities without incident.  If any biopsies were taken you will be contacted by phone or by letter within the next 1-3 weeks.  Please call us at [phone] if you have not heard about the biopsies in 3 weeks.   SIGNATURES/CONFIDENTIALITY: You and/or your care partner have signed paperwork which will be entered into your electronic medical record.  These signatures attest to the fact that that the information above on your After Visit Summary has been reviewed and is understood.  Full responsibility of the confidentiality of this discharge information lies with you and/or your care-partner.  Await pathology Please read over handout about polyps    Continue your normal medications 

## 2015-10-03 NOTE — Op Note (Signed)
[facility] Patient Name: Candace Newman Procedure Date: 10/03/2015 2:20 PM MRN: [id number] Endoscopist: Milus Banister , MD Age: 75 Referring MD:  Date of Birth: [birth year] Gender: Female Account #: [id number] Procedure:                Colonoscopy Indications:              Positive Cologuard test; also personal history of                            adenomatous colon polyp 2008 Medicines:                Monitored Anesthesia Care Procedure:                Pre-Anesthesia Assessment:                           - Prior to the procedure, a History and Physical                            was performed, and patient medications and                            allergies were reviewed. The patient's tolerance of                            previous anesthesia was also reviewed. The risks                            and benefits of the procedure and the sedation                            options and risks were discussed with the patient.                            All questions were answered, and informed consent                            was obtained. Prior Anticoagulants: The patient has                            taken no previous anticoagulant or antiplatelet                            agents. ASA Grade Assessment: II - A patient with                            mild systemic disease. After reviewing the risks                            and benefits, the patient was deemed in                            satisfactory condition to undergo the procedure.  After obtaining informed consent, the colonoscope                            was passed under direct vision. Throughout the                            procedure, the patient's blood pressure, pulse, and                            oxygen saturations were monitored continuously. The                            Model CF-HQ190L [id number]) scope was introduced                            through the anus and advanced  to the the cecum,                            identified by appendiceal orifice and ileocecal                            valve. The colonoscopy was performed without                            difficulty. The patient tolerated the procedure                            well. The quality of the bowel preparation was                            good. The ileocecal valve, appendiceal orifice, and                            rectum were photographed. Scope In: 2:32:49 PM Scope Out: 2:46:35 PM Scope Withdrawal Time: 0 hours 9 minutes 48 seconds  Total Procedure Duration: 0 hours 13 minutes 46 seconds  Findings:                 Five sessile polyps were found in the descending                            colon, ascending colon and cecum. The polyps were 2                            to 5 mm in size. These polyps were removed with a                            cold snare. Resection and retrieval were complete.                           The exam was otherwise without abnormality on                            direct and retroflexion views.  Complications:            No immediate complications. Estimated blood loss:                            None. Estimated Blood Loss:     Estimated blood loss: none. Impression:               - Five 2 to 5 mm polyps in the descending colon, in                            the ascending colon and in the cecum, removed with                            a cold snare. Resected and retrieved.                           - The examination was otherwise normal on direct                            and retroflexion views. Recommendation:           - Patient has a contact number available for                            emergencies. The signs and symptoms of potential                            delayed complications were discussed with the                            patient. Return to normal activities tomorrow.                            Written discharge instructions were provided to the                             patient.                           - Resume previous diet.                           - Continue present medications.                           You will receive a letter within 2-3 weeks with the                            pathology results and my final recommendations.                           If the polyp(s) is proven to be 'pre-cancerous' on                            pathology, you will need repeat colonoscopy in 3  years. Milus Banister, MD 10/03/2015 2:51:08 PM This report has been signed electronically.

## 2015-10-03 NOTE — Progress Notes (Signed)
To recovery, report to [place], RN, VSS 

## 2015-10-04 ENCOUNTER — Telehealth: Payer: Self-pay | Admitting: *Deleted

## 2015-10-04 NOTE — Telephone Encounter (Signed)
  Follow up Call-  Call back number 10/03/2015  Post procedure Call Back phone  # [phone]  Permission to leave phone message Yes  Some recent data might be hidden     Patient questions:  Do you have a fever, pain , or abdominal swelling? No. Pain Score  0 *  Have you tolerated food without any problems? Yes.    Have you been able to return to your normal activities? Yes.    Do you have any questions about your discharge instructions: Diet   No. Medications  No. Follow up visit  No.  Do you have questions or concerns about your Care? No.  Actions: * If pain score is 4 or above: No action needed, pain <4.

## 2015-10-05 ENCOUNTER — Encounter: Payer: Self-pay | Admitting: Gastroenterology

## 2015-11-22 ENCOUNTER — Other Ambulatory Visit (INDEPENDENT_AMBULATORY_CARE_PROVIDER_SITE_OTHER): Payer: Medicare Other

## 2015-11-22 DIAGNOSIS — E785 Hyperlipidemia, unspecified: Secondary | ICD-10-CM

## 2015-11-22 DIAGNOSIS — E871 Hypo-osmolality and hyponatremia: Secondary | ICD-10-CM

## 2015-11-22 DIAGNOSIS — I10 Essential (primary) hypertension: Secondary | ICD-10-CM | POA: Diagnosis not present

## 2015-11-22 DIAGNOSIS — E119 Type 2 diabetes mellitus without complications: Secondary | ICD-10-CM

## 2015-11-22 LAB — COMPREHENSIVE METABOLIC PANEL
ALBUMIN: 4.5 g/dL (ref 3.5–5.2)
ALK PHOS: 74 U/L (ref 39–117)
ALT: 12 U/L (ref 0–35)
AST: 14 U/L (ref 0–37)
BILIRUBIN TOTAL: 0.8 mg/dL (ref 0.2–1.2)
BUN: 14 mg/dL (ref 6–23)
CALCIUM: 9.9 mg/dL (ref 8.4–10.5)
CO2: 31 meq/L (ref 19–32)
CREATININE: 0.87 mg/dL (ref 0.40–1.20)
Chloride: 95 mEq/L — ABNORMAL LOW (ref 96–112)
GFR: 67.33 mL/min (ref >60.00)
Glucose, Bld: 137 mg/dL — ABNORMAL HIGH (ref 70–99)
Potassium: 5.2 mEq/L — ABNORMAL HIGH (ref 3.5–5.1)
Sodium: 130 mEq/L — ABNORMAL LOW (ref 135–145)
TOTAL PROTEIN: 6.9 g/dL (ref 6.0–8.3)

## 2015-11-22 LAB — LIPID PANEL
CHOL/HDL RATIO: 3
Cholesterol: 117 mg/dL (ref 0–200)
HDL: 44.9 mg/dL (ref >39.00)
LDL Cholesterol: 53 mg/dL (ref 0–99)
NonHDL: 72.33
TRIGLYCERIDES: 97 mg/dL (ref 0.0–149.0)
VLDL: 19.4 mg/dL (ref 0.0–40.0)

## 2015-11-22 LAB — HEMOGLOBIN A1C: Hgb A1c MFr Bld: 6.4 % (ref 4.6–6.5)

## 2015-11-27 ENCOUNTER — Encounter: Payer: Self-pay | Admitting: Family Medicine

## 2015-11-27 ENCOUNTER — Ambulatory Visit (INDEPENDENT_AMBULATORY_CARE_PROVIDER_SITE_OTHER): Payer: Medicare Other | Admitting: Family Medicine

## 2015-11-27 VITALS — BP 118/66 | HR 60 | Temp 97.9°F | Ht 61.0 in | Wt 114.2 lb

## 2015-11-27 DIAGNOSIS — E7800 Pure hypercholesterolemia, unspecified: Secondary | ICD-10-CM

## 2015-11-27 DIAGNOSIS — Z86010 Personal history of colonic polyps: Secondary | ICD-10-CM | POA: Insufficient documentation

## 2015-11-27 DIAGNOSIS — Z23 Encounter for immunization: Secondary | ICD-10-CM | POA: Diagnosis not present

## 2015-11-27 DIAGNOSIS — I10 Essential (primary) hypertension: Secondary | ICD-10-CM

## 2015-11-27 DIAGNOSIS — E119 Type 2 diabetes mellitus without complications: Secondary | ICD-10-CM

## 2015-11-27 DIAGNOSIS — E871 Hypo-osmolality and hyponatremia: Secondary | ICD-10-CM

## 2015-11-27 MED ORDER — RAMIPRIL 5 MG PO CAPS: 5.0000 mg | ORAL_CAPSULE | Freq: Every day | ORAL | 11 refills | Status: DC

## 2015-11-27 NOTE — Assessment & Plan Note (Addendum)
bp in fair control at this time  BP Readings from Last 1 Encounters:  11/27/15 118/66   No changes needed Disc lifstyle change with low sodium diet and exercise  Labs reviewed -na is low and K is going up  Will stop the aldactone  inst pt to alert [place] if bp goes up or if she develops edema

## 2015-11-27 NOTE — Progress Notes (Signed)
Subjective:    Patient ID: Candace Newman, female    DOB: [birth year], 75 y.o.   MRN: [id number]  HPI Here for f/u of chronic health problems  Getting ready for thanksgiving - she cooks all the food   Wt Readings from Last 3 Encounters:  11/27/15 114 lb 4 oz (51.8 kg)  10/03/15 113 lb (51.3 kg)  09/05/15 113 lb 6.4 oz (51.4 kg)   bmi is 21.5 No further wt loss Last visit disc adding more protein to diet - she has worked on that   bp is stable today  No cp or palpitations or headaches or edema  No side effects to medicines  BP Readings from Last 3 Encounters:  11/27/15 118/66  10/03/15 125/62  09/05/15 110/76       Chemistry      Component Value Date/Time   NA 130 (L) 11/22/2015 0900   K 5.2 (H) 11/22/2015 0900   CL 95 (L) 11/22/2015 0900   CO2 31 11/22/2015 0900   BUN 14 11/22/2015 0900   CREATININE 0.87 11/22/2015 0900      Component Value Date/Time   CALCIUM 9.9 11/22/2015 0900   ALKPHOS 74 11/22/2015 0900   AST 14 11/22/2015 0900   ALT 12 11/22/2015 0900   BILITOT 0.8 11/22/2015 0900     hx of hyponatremia   Diabetes Home sugar results -no highs or lows  DM diet -eating more protein and gained a lb  Exercise - staying active / housework and yard work  Symptoms-none  A1C last  Lab Results  Component Value Date   HGBA1C 6.4 11/22/2015  this is down from 6.6  No problems with medications -metformin  Renal protection ace Last eye exam  6 mo ago -no retinopathy  Flu shot today  Hx of hyperlipidemia Lab Results  Component Value Date   CHOL 117 11/22/2015   CHOL 113 05/18/2015   CHOL 110 11/10/2014   Lab Results  Component Value Date   HDL 44.90 11/22/2015   HDL 39.60 05/18/2015   HDL 42.10 11/10/2014   Lab Results  Component Value Date   LDLCALC 53 11/22/2015   LDLCALC 59 05/18/2015   LDLCALC 51 11/10/2014   Lab Results  Component Value Date   TRIG 97.0 11/22/2015   TRIG 72.0 05/18/2015   TRIG 89.0 11/10/2014   Lab Results    Component Value Date   CHOLHDL 3 11/22/2015   CHOLHDL 3 05/18/2015   CHOLHDL 3 11/10/2014   No results found for: LDLDIRECT On lipitor 20 mg and diet - good profile   Patient Active Problem List   Diagnosis Date Noted  . History of colonic polyps 11/27/2015  . Routine general medical examination at a health care facility 05/30/2015  . Estrogen deficiency 05/30/2015  . Encounter for Medicare annual wellness exam 01/19/2013  . Personal history of colonic polyps 01/19/2013  . Osteopenia 07/29/2011  . Hyponatremia 02/19/2011  . POSTMENOPAUSAL STATUS 04/14/2007  . Diabetes type 2, controlled ([place]) 03/12/2007  . Hyperlipidemia 03/12/2007  . GLAUCOMA 03/12/2007  . Essential hypertension 03/12/2007   Past Medical History:  Diagnosis Date  . Cataract    REMOVED BILATERAL  . Diabetes mellitus    type II  . Glaucoma    BILATERAL  . Hyperlipidemia   . Hypertension   . Osteopenia    Past Surgical History:  Procedure Laterality Date  . ABDOMINAL HYSTERECTOMY     ovaries intact -non cancer done for bleeding   Social History  Substance Use Topics  . Smoking status: Former Smoker    Types: Cigarettes    Quit date: 01/07/2005  . Smokeless tobacco: Never Used  . Alcohol use No   Family History  Problem Relation Age of Onset  . Heart disease Mother     CAD  . Hypertension Mother   . Heart disease Father     CAD  . Diabetes Brother   . Colon cancer Brother   . Cancer Brother     lymph node CA   No Known Allergies Current Outpatient Prescriptions on File Prior to Visit  Medication Sig Dispense Refill  . ACCU-CHEK SOFTCLIX LANCETS lancets USE DEVICE TO TEST BLOOD SUGAR ONCE DAILY AND AS NEEDED....DX E11.9 100 each 1  . atorvastatin (LIPITOR) 20 MG tablet Take 1 tablet (20 mg total) by mouth daily. 30 tablet 11  . bisoprolol-hydrochlorothiazide (ZIAC) 5-6.25 MG tablet Take 1 tablet by mouth daily. 30 tablet 11  . glucose blood (ACCU-CHEK AVIVA PLUS) test strip TEST BLOOD  SUGAR ONCE DAILY AND AS NEEDED (DX. E11.9) 100 each 0  . latanoprost (XALATAN) 0.005 % ophthalmic solution Use eye drops as directed.     . metFORMIN (GLUCOPHAGE) 500 MG tablet Take 1 tablet (500 mg total) by mouth daily. 30 tablet 11  . Multiple Vitamin (MULTIVITAMIN) tablet Take 1 tablet by mouth daily.      . timolol (TIMOPTIC) 0.5 % ophthalmic solution USE 1 DROP IN BOTH EYES IN THE MORNING  6   Current Facility-Administered Medications on File Prior to Visit  Medication Dose Route Frequency Provider Last Rate Last Dose  . 0.9 %  sodium chloride infusion  500 mL Intravenous Continuous Milus Banister, MD         Review of Systems    Review of Systems  Constitutional: Negative for fever, appetite change, fatigue and unexpected weight change.  Eyes: Negative for pain and visual disturbance.  Respiratory: Negative for cough and shortness of breath.   Cardiovascular: Negative for cp or palpitations    Gastrointestinal: Negative for nausea, diarrhea and constipation.  Genitourinary: Negative for urgency and frequency.  Skin: Negative for pallor or rash   Neurological: Negative for weakness, light-headedness, numbness and headaches.  Hematological: Negative for adenopathy. Does not bruise/bleed easily.  Psychiatric/Behavioral: Negative for dysphoric mood. The patient is not nervous/anxious.      Objective:   Physical Exam  Constitutional: She appears well-developed and well-nourished. No distress.  Slim and well appearing elderly female  HENT:  Head: Normocephalic and atraumatic.  Mouth/Throat: Oropharynx is clear and moist.  Eyes: Conjunctivae and EOM are normal. Pupils are equal, round, and reactive to light.  Neck: Normal range of motion. Neck supple. No JVD present. Carotid bruit is not present. No thyromegaly present.  Cardiovascular: Normal rate, regular rhythm, normal heart sounds and intact distal pulses.  Exam reveals no gallop.   Pulmonary/Chest: Effort normal and breath  sounds normal. No respiratory distress. She has no wheezes. She has no rales.  No crackles  Abdominal: Soft. Bowel sounds are normal. She exhibits no distension, no abdominal bruit and no mass. There is no tenderness.  Musculoskeletal: She exhibits no edema.  Lymphadenopathy:    She has no cervical adenopathy.  Neurological: She is alert. She has normal reflexes.  Skin: Skin is warm and dry. No rash noted.  Psychiatric: She has a normal mood and affect.          Assessment & Plan:   Problem List Items Addressed This  Visit      Cardiovascular and Mediastinum   Essential hypertension    bp in fair control at this time  BP Readings from Last 1 Encounters:  11/27/15 118/66   No changes needed Disc lifstyle change with low sodium diet and exercise  Labs reviewed -na is low and K is going up  Will stop the aldactone  inst pt to alert [place] if bp goes up or if she develops edema        Relevant Medications   ramipril (ALTACE) 5 MG capsule     Endocrine   Diabetes type 2, controlled ([place])    Lab Results  Component Value Date   HGBA1C 6.4 11/22/2015   Some imp with more protein in diet  opth utd Enc exercise F/u 6 mo       Relevant Medications   ramipril (ALTACE) 5 MG capsule     Other   History of colonic polyps    Adenomatous polyps on recent colonoscopy 3 year recall        Hyperlipidemia    Disc goals for lipids and reasons to control them Rev labs with pt Rev low sat fat diet in detail Well controlled with statin and diet       Relevant Medications   ramipril (ALTACE) 5 MG capsule   Hyponatremia    Na is down to 130 (and K is up)  Will stop her spironolactone-likely the cause  Re check at next f/u  No symptoms        Other Visit Diagnoses    Need for influenza vaccination    -  Primary   Relevant Orders   Flu Vaccine QUAD 36+ mos IM (Completed)

## 2015-11-27 NOTE — Patient Instructions (Addendum)
Stop the aldactone (sprinololactone) - I think it is affecting your electrolyte levels  Keep eating a healthy diet with lots of protein  Drink your water  Cholesterol and blood glucose control are good  Flu shot today   Follow up in 6 months for your annual exam

## 2015-11-27 NOTE — Assessment & Plan Note (Signed)
Adenomatous polyps on recent colonoscopy 3 year recall

## 2015-11-27 NOTE — Assessment & Plan Note (Signed)
Lab Results  Component Value Date   HGBA1C 6.4 11/22/2015   Some imp with more protein in diet  opth utd Enc exercise F/u 6 mo

## 2015-11-27 NOTE — Assessment & Plan Note (Signed)
Disc goals for lipids and reasons to control them Rev labs with pt Rev low sat fat diet in detail  Well controlled with statin and diet  

## 2015-11-27 NOTE — Progress Notes (Signed)
Pre visit review using our clinic review tool, if applicable. No additional management support is needed unless otherwise documented below in the visit note. 

## 2015-11-27 NOTE — Assessment & Plan Note (Signed)
Na is down to 130 (and K is up)  Will stop her spironolactone-likely the cause  Re check at next f/u  No symptoms

## 2015-12-01 ENCOUNTER — Other Ambulatory Visit: Payer: Self-pay | Admitting: Family Medicine

## 2016-01-27 ENCOUNTER — Other Ambulatory Visit: Payer: Self-pay | Admitting: Family Medicine

## 2016-02-14 ENCOUNTER — Other Ambulatory Visit: Payer: Self-pay | Admitting: Family Medicine

## 2016-02-14 DIAGNOSIS — Z1231 Encounter for screening mammogram for malignant neoplasm of breast: Secondary | ICD-10-CM

## 2016-03-20 ENCOUNTER — Ambulatory Visit
Admission: RE | Admit: 2016-03-20 | Discharge: 2016-03-20 | Disposition: A | Discharge status: Home / Self Care | Payer: Medicare Other | Source: Ambulatory Visit | Attending: Family Medicine | Admitting: Family Medicine

## 2016-03-20 DIAGNOSIS — Z1231 Encounter for screening mammogram for malignant neoplasm of breast: Secondary | ICD-10-CM

## 2016-03-21 ENCOUNTER — Other Ambulatory Visit: Payer: Self-pay | Admitting: Family Medicine

## 2016-03-21 DIAGNOSIS — R928 Other abnormal and inconclusive findings on diagnostic imaging of breast: Secondary | ICD-10-CM

## 2016-03-27 ENCOUNTER — Ambulatory Visit
Admission: RE | Admit: 2016-03-27 | Discharge: 2016-03-27 | Disposition: A | Discharge status: Home / Self Care | Payer: Medicare Other | Source: Ambulatory Visit | Attending: Family Medicine | Admitting: Family Medicine

## 2016-03-27 DIAGNOSIS — R928 Other abnormal and inconclusive findings on diagnostic imaging of breast: Secondary | ICD-10-CM

## 2016-05-28 ENCOUNTER — Other Ambulatory Visit: Payer: Self-pay | Admitting: Family Medicine

## 2016-05-30 ENCOUNTER — Telehealth: Payer: Self-pay | Admitting: Family Medicine

## 2016-05-30 ENCOUNTER — Ambulatory Visit (INDEPENDENT_AMBULATORY_CARE_PROVIDER_SITE_OTHER): Payer: Medicare Other

## 2016-05-30 VITALS — BP 120/72 | HR 70 | Temp 98.1°F | Ht 61.0 in | Wt 112.8 lb

## 2016-05-30 DIAGNOSIS — E119 Type 2 diabetes mellitus without complications: Secondary | ICD-10-CM | POA: Diagnosis not present

## 2016-05-30 DIAGNOSIS — Z0000 Encounter for general adult medical examination without abnormal findings: Secondary | ICD-10-CM

## 2016-05-30 LAB — COMPREHENSIVE METABOLIC PANEL
ALBUMIN: 4.5 g/dL (ref 3.5–5.2)
ALT: 15 U/L (ref 0–35)
AST: 16 U/L (ref 0–37)
Alkaline Phosphatase: 66 U/L (ref 39–117)
BUN: 14 mg/dL (ref 6–23)
CHLORIDE: 93 meq/L — AB (ref 96–112)
CO2: 30 mEq/L (ref 19–32)
CREATININE: 0.84 mg/dL (ref 0.40–1.20)
Calcium: 10 mg/dL (ref 8.4–10.5)
GFR: 70.01 mL/min (ref >60.00)
Glucose, Bld: 144 mg/dL — ABNORMAL HIGH (ref 70–99)
Potassium: 4.9 mEq/L (ref 3.5–5.1)
SODIUM: 127 meq/L — AB (ref 135–145)
Total Bilirubin: 0.7 mg/dL (ref 0.2–1.2)
Total Protein: 6.8 g/dL (ref 6.0–8.3)

## 2016-05-30 LAB — CBC WITH DIFFERENTIAL/PLATELET
BASOS PCT: 1 % (ref 0.0–3.0)
Basophils Absolute: 0.1 10*3/uL (ref 0.0–0.1)
Eosinophils Absolute: 0.1 10*3/uL (ref 0.0–0.7)
Eosinophils Relative: 1.1 % (ref 0.0–5.0)
HCT: 40.3 % (ref 36.0–46.0)
HEMOGLOBIN: 13.5 g/dL (ref 12.0–15.0)
Lymphocytes Relative: 12.7 % (ref 12.0–46.0)
Lymphs Abs: 1.1 10*3/uL (ref 0.7–4.0)
MCHC: 33.6 g/dL (ref 30.0–36.0)
MCV: 90.8 fl (ref 78.0–100.0)
Monocytes Absolute: 0.7 10*3/uL (ref 0.1–1.0)
Monocytes Relative: 8.2 % (ref 3.0–12.0)
Neutro Abs: 6.9 10*3/uL (ref 1.4–7.7)
Neutrophils Relative %: 77 % (ref 43.0–77.0)
PLATELETS: 248 10*3/uL (ref 150.0–400.0)
RBC: 4.44 Mil/uL (ref 3.87–5.11)
RDW: 12.7 % (ref 11.5–15.5)
WBC: 9 10*3/uL (ref 4.0–10.5)

## 2016-05-30 LAB — TSH: TSH: 1.84 u[IU]/mL (ref 0.35–4.50)

## 2016-05-30 LAB — LIPID PANEL
CHOLESTEROL: 112 mg/dL (ref 0–200)
HDL: 40.2 mg/dL (ref >39.00)
LDL Cholesterol: 51 mg/dL (ref 0–99)
NonHDL: 71.85
Total CHOL/HDL Ratio: 3
Triglycerides: 106 mg/dL (ref 0.0–149.0)
VLDL: 21.2 mg/dL (ref 0.0–40.0)

## 2016-05-30 LAB — HEMOGLOBIN A1C: Hgb A1c MFr Bld: 6.5 % (ref 4.6–6.5)

## 2016-05-30 NOTE — Progress Notes (Signed)
PCP notes:   Health maintenance:  Eye exam - per pt, exam in March 2018 A1C -completed  Abnormal screenings:   Fall risk - hx of fall with injury; no medical treatment Mini-Cog score: 18/20  Patient concerns:   None  Nurse concerns:  None  Next PCP appt:   06/05/16 @ 1030

## 2016-05-30 NOTE — Patient Instructions (Signed)
Ms. Venturini , Thank you for taking time to come for your Medicare Wellness Visit. I appreciate your ongoing commitment to your health goals. Please review the following plan we discussed and let me know if I can assist you in the future.   These are the goals we discussed: Goals    . vegetable intake          Starting 05/30/2016, I will continue to consume at least 4-5 servings of vegetables daily.        This is a list of the screening recommended for you and due dates:  Health Maintenance  Topic Date Due  . Flu Shot  08/07/2016  . Complete foot exam   11/26/2016  . Hemoglobin A1C  11/30/2016  . Eye exam for diabetics  03/07/2017  . Mammogram  03/20/2017  . Tetanus Vaccine  03/24/2017  . Colon Cancer Screening  10/03/2018  . DEXA scan (bone density measurement)  Completed  . Pneumonia vaccines  Completed   Preventive Care for Adults  A healthy lifestyle and preventive care can promote health and wellness. Preventive health guidelines for adults include the following key practices.  . A routine yearly physical is a good way to check with your health care provider about your health and preventive screening. It is a chance to share any concerns and updates on your health and to receive a thorough exam.  . Visit your dentist for a routine exam and preventive care every 6 months. Brush your teeth twice a day and floss once a day. Good oral hygiene prevents tooth decay and gum disease.  . The frequency of eye exams is based on your age, health, family medical history, use  of contact lenses, and other factors. Follow your health care provider's ecommendations for frequency of eye exams.  . Eat a healthy diet. Foods like vegetables, fruits, whole grains, low-fat dairy products, and lean protein foods contain the nutrients you need without too many calories. Decrease your intake of foods high in solid fats, added sugars, and salt. Eat the right amount of calories for you. Get information  about a proper diet from your health care provider, if necessary.  . Regular physical exercise is one of the most important things you can do for your health. Most adults should get at least 150 minutes of moderate-intensity exercise (any activity that increases your heart rate and causes you to sweat) each week. In addition, most adults need muscle-strengthening exercises on 2 or more days a week.  Silver Sneakers may be a benefit available to you. To determine eligibility, you may visit the website: www.silversneakers.com or contact program at [phone] Mon-Fri between 8AM-8PM.   . Maintain a healthy weight. The body mass index (BMI) is a screening tool to identify possible weight problems. It provides an estimate of body fat based on height and weight. Your health care provider can find your BMI and can help you achieve or maintain a healthy weight.   For adults 20 years and older: ? A BMI below 18.5 is considered underweight. ? A BMI of 18.5 to 24.9 is normal. ? A BMI of 25 to 29.9 is considered overweight. ? A BMI of 30 and above is considered obese.   . Maintain normal blood lipids and cholesterol levels by exercising and minimizing your intake of saturated fat. Eat a balanced diet with plenty of fruit and vegetables. Blood tests for lipids and cholesterol should begin at age 38 and be repeated every 5 years. If your  lipid or cholesterol levels are high, you are over 50, or you are at high risk for heart disease, you may need your cholesterol levels checked more frequently. Ongoing high lipid and cholesterol levels should be treated with medicines if diet and exercise are not working.  . If you smoke, find out from your health care provider how to quit. If you do not use tobacco, please do not start.  . If you choose to drink alcohol, please do not consume more than 2 drinks per day. One drink is considered to be 12 ounces (355 mL) of beer, 5 ounces (148 mL) of wine, or 1.5 ounces (44  mL) of liquor.  . If you are 94-78 years old, ask your health care provider if you should take aspirin to prevent strokes.  . Use sunscreen. Apply sunscreen liberally and repeatedly throughout the day. You should seek shade when your shadow is shorter than you. Protect yourself by wearing long sleeves, pants, a wide-brimmed hat, and sunglasses year round, whenever you are outdoors.  . Once a month, do a whole body skin exam, using a mirror to look at the skin on your back. Tell your health care provider of new moles, moles that have irregular borders, moles that are larger than a pencil eraser, or moles that have changed in shape or color.

## 2016-05-30 NOTE — Telephone Encounter (Signed)
-----   Message from Eustace Pen, LPN sent at [phone]  4:34 PM EDT ----- Regarding: Labs 5/24 [facility] Medicare  Please place lab orders.

## 2016-05-30 NOTE — Progress Notes (Signed)
Subjective:   Candace Newman is a 76 y.o. female who presents for Medicare Annual (Subsequent) preventive examination.  Review of Systems:  N/A Cardiac Risk Factors include: advanced age (>67men, >19 women);dyslipidemia;diabetes mellitus;hypertension     Objective:     Vitals: BP 120/72 (BP Location: Right Arm, Patient Position: Sitting, Cuff Size: Normal)   Pulse 70   Temp 98.1 F (36.7 C) (Oral)   Ht 5\' 1"  (1.549 m) Comment: no shoes  Wt 112 lb 12 oz (51.1 kg)   SpO2 97%   BMI 21.30 kg/m   Body mass index is 21.3 kg/m.   Tobacco History  Smoking Status  . Former Smoker  . Types: Cigarettes  . Quit date: 01/07/2005  Smokeless Tobacco  . Never Used     Counseling given: No   Past Medical History:  Diagnosis Date  . Cataract    REMOVED BILATERAL  . Diabetes mellitus    type II  . Glaucoma    BILATERAL  . Hyperlipidemia   . Hypertension   . Osteopenia    Past Surgical History:  Procedure Laterality Date  . ABDOMINAL HYSTERECTOMY     ovaries intact -non cancer done for bleeding   Family History  Problem Relation Age of Onset  . Heart disease Mother        CAD  . Hypertension Mother   . Heart disease Father        CAD  . Diabetes Brother   . Colon cancer Brother   . Cancer Brother        lymph node CA   History  Sexual Activity  . Sexual activity: No    Outpatient Encounter Prescriptions as of 05/30/2016  Medication Sig  . ACCU-CHEK AVIVA PLUS test strip TEST BLOOD SUGAR ONCE DAILY AND AS NEEDED (DX. E11.9)  . ACCU-CHEK SOFTCLIX LANCETS lancets USE DEVICE TO TEST BLOOD SUGAR ONCE DAILY AND AS NEEDED.Marland KitchenMarland KitchenDX E11.9  . atorvastatin (LIPITOR) 20 MG tablet Take 1 tablet (20 mg total) by mouth daily.  . bisoprolol-hydrochlorothiazide (ZIAC) 5-6.25 MG tablet Take 1 tablet by mouth daily.  Marland Kitchen latanoprost (XALATAN) 0.005 % ophthalmic solution Use eye drops as directed.   . metFORMIN (GLUCOPHAGE) 500 MG tablet TAKE 1 TABLET BY MOUTH EVERY DAY  . Multiple  Vitamin (MULTIVITAMIN) tablet Take 1 tablet by mouth daily.    . ramipril (ALTACE) 5 MG capsule Take 1 capsule (5 mg total) by mouth daily.  Marland Kitchen spironolactone (ALDACTONE) 25 MG tablet TAKE 1 TABLET BY MOUTH EVERY DAY  . timolol (TIMOPTIC) 0.5 % ophthalmic solution USE 1 DROP IN BOTH EYES IN THE MORNING   Facility-Administered Encounter Medications as of 05/30/2016  Medication  . 0.9 %  sodium chloride infusion    Activities of Daily Living In your present state of health, do you have any difficulty performing the following activities: 05/30/2016  Hearing? N  Vision? N  Difficulty concentrating or making decisions? N  Walking or climbing stairs? Y  Dressing or bathing? N  Doing errands, shopping? N  Preparing Food and eating ? N  Using the Toilet? N  In the past six months, have you accidently leaked urine? N  Do you have problems with loss of bowel control? N  Managing your Medications? N  Managing your Finances? N  Housekeeping or managing your Housekeeping? N  Some recent data might be hidden    Patient Care Team: [name], [name], MD as PCP - General Calvert Cantor, MD as Consulting Physician (Ophthalmology)  Assessment:      Hearing Screening   125Hz  250Hz  500Hz  1000Hz  2000Hz  3000Hz  4000Hz  6000Hz  8000Hz   Right ear:   40 40 40  40    Left ear:   40 40 40  40    Vision Screening Comments: Last vision exam in March 2018 with Dr. Bing Plume   Exercise Activities and Dietary recommendations Current Exercise Habits: The patient does not participate in regular exercise at present, Exercise limited by: None identified  Goals    . vegetable intake          Starting 05/30/2016, I will continue to consume at least 4-5 servings of vegetables daily.       Fall Risk Fall Risk  05/30/2016 02/04/2014 01/19/2013  Falls in the past year? Yes Yes Yes  Number falls in past yr: 1 1 1   Injury with Fall? Yes - No   Depression Screen PHQ 2/9 Scores 05/30/2016 02/04/2014 01/19/2013  PHQ - 2  Score 0 0 0     Cognitive Function MMSE - Mini Mental State Exam 05/30/2016  Orientation to time 5  Orientation to Place 5  Registration 3  Attention/ Calculation 0  Recall 1  Recall-comments pt was unable to recall 2 of 3 words  Language- name 2 objects 0  Language- repeat 1  Language- follow 3 step command 3  Language- read & follow direction 0  Write a sentence 0  Copy design 0  Total score 18       PLEASE NOTE: A Mini-Cog screen was completed. Maximum score is 20. A value of 0 denotes this part of Folstein MMSE was not completed or the patient failed this part of the Mini-Cog screening.   Mini-Cog Screening Orientation to Time - Max 5 pts Orientation to Place - Max 5 pts Registration - Max 3 pts Recall - Max 3 pts Language Repeat - Max 1 pts Language Follow 3 Step Command - Max 3 pts   Immunization History  Administered Date(s) Administered  . Influenza Split 01/28/2011  . Influenza Whole 01/31/2006, 11/14/2008  . Influenza,inj,Quad PF,36+ Mos 01/19/2013, 11/16/2014, 11/27/2015  . Pneumococcal Conjugate-13 02/04/2014  . Pneumococcal Polysaccharide-23 07/23/2010  . Td 03/25/2007   Screening Tests Health Maintenance  Topic Date Due  . INFLUENZA VACCINE  08/07/2016  . FOOT EXAM  11/26/2016  . HEMOGLOBIN A1C  11/30/2016  . OPHTHALMOLOGY EXAM  03/07/2017  . MAMMOGRAM  03/20/2017  . TETANUS/TDAP  03/24/2017  . COLONOSCOPY  10/03/2018  . DEXA SCAN  Completed  . PNA vac Low Risk Adult  Completed      Plan:     I have personally reviewed and addressed the Medicare Annual Wellness questionnaire and have noted the following in the patient's chart:  A. Medical and social history B. Use of alcohol, tobacco or illicit drugs  C. Current medications and supplements D. Functional ability and status E.  Nutritional status F.  Physical activity G. Advance directives H. List of other physicians I.  Hospitalizations, surgeries, and ER visits in previous 12 months J.    [place] to include hearing, vision, cognitive, depression L. Referrals and appointments - none  In addition, I have reviewed and discussed with patient certain preventive protocols, quality metrics, and best practice recommendations. A written personalized care plan for preventive services as well as general preventive health recommendations were provided to patient.  See attached scanned questionnaire for additional information.   Signed,   Lindell Noe, MHA, BS, LPN Health Coach

## 2016-05-30 NOTE — Progress Notes (Signed)
Pre visit review using our clinic review tool, if applicable. No additional management support is needed unless otherwise documented below in the visit note. 

## 2016-06-02 NOTE — Progress Notes (Signed)
I reviewed health advisor's note, was available for consultation, and agree with documentation and plan. Denisa Enterline Kendal, NP  

## 2016-06-05 ENCOUNTER — Ambulatory Visit (INDEPENDENT_AMBULATORY_CARE_PROVIDER_SITE_OTHER): Payer: Medicare Other | Admitting: Family Medicine

## 2016-06-05 ENCOUNTER — Telehealth: Payer: Self-pay

## 2016-06-05 ENCOUNTER — Encounter: Payer: Self-pay | Admitting: Family Medicine

## 2016-06-05 VITALS — BP 126/70 | HR 67 | Temp 98.2°F | Ht 61.0 in | Wt 114.8 lb

## 2016-06-05 DIAGNOSIS — E119 Type 2 diabetes mellitus without complications: Secondary | ICD-10-CM | POA: Diagnosis not present

## 2016-06-05 DIAGNOSIS — Z86010 Personal history of colonic polyps: Secondary | ICD-10-CM

## 2016-06-05 DIAGNOSIS — E871 Hypo-osmolality and hyponatremia: Secondary | ICD-10-CM

## 2016-06-05 DIAGNOSIS — I10 Essential (primary) hypertension: Secondary | ICD-10-CM | POA: Diagnosis not present

## 2016-06-05 DIAGNOSIS — Z0000 Encounter for general adult medical examination without abnormal findings: Secondary | ICD-10-CM

## 2016-06-05 DIAGNOSIS — M8580 Other specified disorders of bone density and structure, unspecified site: Secondary | ICD-10-CM

## 2016-06-05 DIAGNOSIS — E7800 Pure hypercholesterolemia, unspecified: Secondary | ICD-10-CM

## 2016-06-05 MED ORDER — METFORMIN HCL 500 MG PO TABS: 500.0000 mg | ORAL_TABLET | Freq: Every day | ORAL | 11 refills | Status: AC

## 2016-06-05 MED ORDER — RAMIPRIL 5 MG PO CAPS: 5.0000 mg | ORAL_CAPSULE | Freq: Every day | ORAL | 11 refills | Status: DC

## 2016-06-05 MED ORDER — ATORVASTATIN CALCIUM 20 MG PO TABS: 20.0000 mg | ORAL_TABLET | Freq: Every day | ORAL | 11 refills | Status: AC

## 2016-06-05 MED ORDER — BISOPROLOL FUMARATE 5 MG PO TABS: 5.0000 mg | ORAL_TABLET | Freq: Every day | ORAL | 11 refills | Status: DC

## 2016-06-05 NOTE — Patient Instructions (Addendum)
Keep socializing and reading for memory health   If you are interested in the new shingles vaccine (Shingrix) - call your insurance to check on coverage,( you should not get it within 1 month of other vaccines) , then call us for a prescription  for it to take to a pharmacy that gives the shot , or make a nurse visit to get it here depending on your coverage  I am going to change your bisoprolol-hct to plain bisoprolol due to low sodium  We will re check that in 2 weeks (sodium level)   Take care of yourself

## 2016-06-05 NOTE — Telephone Encounter (Signed)
Candace Newman at [facility] called to verify the bisoprolol was plain now with no HCTZ; advised per office note dated 06/05/16 that bisoprolol is plain with no HCTZ. Candace Newman voiced understanding.

## 2016-06-05 NOTE — Assessment & Plan Note (Signed)
3 y recall colonoscopy (last one 9/17) Also family hx colon cancer

## 2016-06-05 NOTE — Assessment & Plan Note (Signed)
127-down from 130 Off spironolactone  Will remove hctz and give plain bisoprolol  Re check 2 wk  Disc water intake

## 2016-06-05 NOTE — Assessment & Plan Note (Signed)
Reviewed health habits including diet and exercise and skin cancer prevention Reviewed appropriate screening tests for age  Also reviewed health mt list, fam hx and immunization status , as well as social and family history   See HPI amw rev Labs rev She will look into getting shingrix if affordable Re check Na in 2 wk F/u 6 mo  Good self care  dexa due in a year 3 y recall for colonsocopy  Disc fall prev in detail

## 2016-06-05 NOTE — Assessment & Plan Note (Signed)
bp in fair control at this time  BP Readings from Last 1 Encounters:  06/05/16 126/70   No changes needed Disc lifstyle change with low sodium diet and exercise  Labs rev  Due to hyponatremia-will eliminate hctz  Re check na in 2 wk

## 2016-06-05 NOTE — Assessment & Plan Note (Signed)
S/p full course of fosamax No fractures Disc need for calcium/ vitamin D/ wt bearing exercise and bone density test every 2 y to monitor Disc safety/ fracture risk in detail

## 2016-06-05 NOTE — Progress Notes (Signed)
Subjective:    Patient ID: Candace Newman, female    DOB: [birth year], 76 y.o.   MRN: [id number]  HPI Here for health maintenance exam and to review chronic medical problems    Feeling good /staying busy  Her R knee hurts when she walks - and no injury  ? If it was a little puffy   2 mo great grandchild- is happy   Had amw on 5/24 Noted fall with injury-- per pt she took shoes off outside for wet feet and she stepped into her den and slipped (bruised her face) - not bad  Learned her lesson about wet feet for now (plans to not go barefoot) Mini cog score 18/20  (unable to recall 2 of 3 words)  Pt states she occ forgets new names-otherwise no problems  No confusion  Has not become lost  She can concentration   Wt Readings from Last 3 Encounters:  06/05/16 114 lb 12 oz (52.1 kg)  05/30/16 112 lb 12 oz (51.1 kg)  11/27/15 114 lb 4 oz (51.8 kg)  stable - mt her wt  Appetite is good  bmi is 21.6  Mammogram 3/18-addn views were neg Self breast exam-no lumps or changes   Colonoscopy 9/17- adenomas /fragments   (after a positive cologuard) 3 y recall - she agrees to that  Brother had colon cancer   dexa 6/17-osteopenia  Fosamax full course in the past She is taking her ca and D No fractures One fall   Zoster - she is interested in shingrix if covered   bp is stable today  No cp or palpitations or headaches or edema  No side effects to medicines  BP Readings from Last 3 Encounters:  06/05/16 126/70  05/30/16 120/72  11/27/15 118/66      dm2 Lab Results  Component Value Date   HGBA1C 6.5 05/30/2016   This is up from 6.4 On metformin  Eating a diabetic diet  Exercise- works in the yard and flowers /weeding - stays very active  Indoor exercise in the winter and plays with great grandchildren  Well controlled Eye exam utd  Ace for renal protection   Hx of hyperlipidemia Lab Results  Component Value Date   CHOL 112 05/30/2016   CHOL 117 11/22/2015   CHOL  113 05/18/2015   Lab Results  Component Value Date   HDL 40.20 05/30/2016   HDL 44.90 11/22/2015   HDL 39.60 05/18/2015   Lab Results  Component Value Date   LDLCALC 51 05/30/2016   LDLCALC 53 11/22/2015   LDLCALC 59 05/18/2015   Lab Results  Component Value Date   TRIG 106.0 05/30/2016   TRIG 97.0 11/22/2015   TRIG 72.0 05/18/2015   Lab Results  Component Value Date   CHOLHDL 3 05/30/2016   CHOLHDL 3 11/22/2015   CHOLHDL 3 05/18/2015   No results found for: LDLDIRECT On atorvastatin and diet  Good control   Hx of hyponatremia   Chemistry      Component Value Date/Time   NA 127 (L) 05/30/2016 0905   K 4.9 05/30/2016 0905   CL 93 (L) 05/30/2016 0905   CO2 30 05/30/2016 0905   BUN 14 05/30/2016 0905   CREATININE 0.84 05/30/2016 0905      Component Value Date/Time   CALCIUM 10.0 05/30/2016 0905   ALKPHOS 66 05/30/2016 0905   AST 16 05/30/2016 0905   ALT 15 05/30/2016 0905   BILITOT 0.7 05/30/2016 0905  NA is lower  She is off spironolactone Asked her to cut back water intake (she is not a big water drinker per hx)  She is also on ziac   Lab Results  Component Value Date   WBC 9.0 05/30/2016   HGB 13.5 05/30/2016   HCT 40.3 05/30/2016   MCV 90.8 05/30/2016   PLT 248.0 05/30/2016   Lab Results  Component Value Date   TSH 1.84 05/30/2016      Review of Systems Review of Systems  Constitutional: Negative for fever, appetite change, fatigue and unexpected weight change.  Eyes: Negative for pain and visual disturbance.  Respiratory: Negative for cough and shortness of breath.   Cardiovascular: Negative for cp or palpitations    Gastrointestinal: Negative for nausea, diarrhea and constipation.  Genitourinary: Negative for urgency and frequency.  Skin: Negative for pallor or rash   MSK pos for R knee pain  Neurological: Negative for weakness, light-headedness, numbness and headaches.  Hematological: Negative for adenopathy. Does not bruise/bleed  easily.  Psychiatric/Behavioral: Negative for dysphoric mood. The patient is not nervous/anxious.         Objective:   Physical Exam  Constitutional: She appears well-developed and well-nourished. No distress.  Slim and well appearing elderly female  HENT:  Head: Normocephalic and atraumatic.  Right Ear: External ear normal.  Left Ear: External ear normal.  Mouth/Throat: Oropharynx is clear and moist.  Eyes: Conjunctivae and EOM are normal. Pupils are equal, round, and reactive to light. No scleral icterus.  Neck: Normal range of motion. Neck supple. No JVD present. Carotid bruit is not present. No thyromegaly present.  Cardiovascular: Normal rate, regular rhythm, normal heart sounds and intact distal pulses.  Exam reveals no gallop.   Pulmonary/Chest: Effort normal and breath sounds normal. No respiratory distress. She has no wheezes. She exhibits no tenderness.  Abdominal: Soft. Bowel sounds are normal. She exhibits no distension, no abdominal bruit and no mass. There is no tenderness.  Genitourinary: No breast swelling, tenderness, discharge or bleeding.  Genitourinary Comments: Breast exam: No mass, nodules, thickening, tenderness, bulging, retraction, inflamation, nipple discharge or skin changes noted.  No axillary or clavicular LA.      Musculoskeletal: Normal range of motion. She exhibits no edema or tenderness.  No kyphosis   Lymphadenopathy:    She has no cervical adenopathy.  Neurological: She is alert. She has normal reflexes. No cranial nerve deficit. She exhibits normal muscle tone. Coordination normal.  Skin: Skin is warm and dry. No rash noted. No erythema. No pallor.  Solar lentigines diffusely   Psychiatric: She has a normal mood and affect.  Pleasant Mentally sharp          Assessment & Plan:   Problem List Items Addressed This Visit      Cardiovascular and Mediastinum   Essential hypertension - Primary    bp in fair control at this time  BP Readings  from Last 1 Encounters:  06/05/16 126/70   No changes needed Disc lifstyle change with low sodium diet and exercise  Labs rev  Due to hyponatremia-will eliminate hctz  Re check na in 2 wk      Relevant Medications   bisoprolol (ZEBETA) 5 MG tablet   atorvastatin (LIPITOR) 20 MG tablet   ramipril (ALTACE) 5 MG capsule     Endocrine   Diabetes type 2, controlled (Effort)    Lab Results  Component Value Date   HGBA1C 6.5 05/30/2016   Doing well with metformin and diet  Disc eye and foot care F/u 6 mo      Relevant Medications   atorvastatin (LIPITOR) 20 MG tablet   metFORMIN (GLUCOPHAGE) 500 MG tablet   ramipril (ALTACE) 5 MG capsule     Musculoskeletal and Integument   Osteopenia    S/p full course of fosamax No fractures Disc need for calcium/ vitamin D/ wt bearing exercise and bone density test every 2 y to monitor Disc safety/ fracture risk in detail          Other   History of colonic polyps    3 y recall colonoscopy (last one 9/17) Also family hx colon cancer      Hyperlipidemia    Disc goals for lipids and reasons to control them Rev labs with pt Rev low sat fat diet in detail Well controlled with atorvastatin and diet       Relevant Medications   bisoprolol (ZEBETA) 5 MG tablet   atorvastatin (LIPITOR) 20 MG tablet   ramipril (ALTACE) 5 MG capsule   Hyponatremia    127-down from 130 Off spironolactone  Will remove hctz and give plain bisoprolol  Re check 2 wk  Disc water intake      Routine general medical examination at a health care facility    Reviewed health habits including diet and exercise and skin cancer prevention Reviewed appropriate screening tests for age  Also reviewed health mt list, fam hx and immunization status , as well as social and family history   See HPI amw rev Labs rev She will look into getting shingrix if affordable Re check Na in 2 wk F/u 6 mo  Good self care  dexa due in a year 3 y recall for colonsocopy  Disc  fall prev in detail

## 2016-06-05 NOTE — Assessment & Plan Note (Signed)
Lab Results  Component Value Date   HGBA1C 6.5 05/30/2016   Doing well with metformin and diet  Disc eye and foot care F/u 6 mo

## 2016-06-05 NOTE — Assessment & Plan Note (Signed)
Disc goals for lipids and reasons to control them Rev labs with pt Rev low sat fat diet in detail  Well controlled with atorvastatin and diet  

## 2016-06-14 ENCOUNTER — Other Ambulatory Visit: Payer: Self-pay | Admitting: Family Medicine

## 2016-06-19 ENCOUNTER — Encounter: Payer: Self-pay | Admitting: Family Medicine

## 2016-06-19 ENCOUNTER — Ambulatory Visit (INDEPENDENT_AMBULATORY_CARE_PROVIDER_SITE_OTHER): Payer: Medicare Other | Admitting: Family Medicine

## 2016-06-19 VITALS — BP 126/76 | HR 62 | Temp 98.3°F | Ht 61.0 in | Wt 116.5 lb

## 2016-06-19 DIAGNOSIS — E871 Hypo-osmolality and hyponatremia: Secondary | ICD-10-CM

## 2016-06-19 DIAGNOSIS — I10 Essential (primary) hypertension: Secondary | ICD-10-CM | POA: Diagnosis not present

## 2016-06-19 LAB — BASIC METABOLIC PANEL
BUN: 9 mg/dL (ref 6–23)
CO2: 30 meq/L (ref 19–32)
Calcium: 10 mg/dL (ref 8.4–10.5)
Chloride: 99 mEq/L (ref 96–112)
Creatinine, Ser: 0.81 mg/dL (ref 0.40–1.20)
GFR: 73 mL/min (ref >60.00)
GLUCOSE: 84 mg/dL (ref 70–99)
POTASSIUM: 5.9 meq/L — AB (ref 3.5–5.1)
Sodium: 133 mEq/L — ABNORMAL LOW (ref 135–145)

## 2016-06-19 NOTE — Assessment & Plan Note (Signed)
bp in fair control at this time  BP Readings from Last 1 Encounters:  06/19/16 126/76   No changes needed Disc lifstyle change with low sodium diet and exercise  Doing well off hctz  Lab today -(hyponatremia in the past)

## 2016-06-19 NOTE — Patient Instructions (Signed)
Continue current medicines - BP is great Labs today  Hope sodium level looks better  Take care of yourself   If you want to take 81 mg aspirin per day (with food) that is fine

## 2016-06-19 NOTE — Assessment & Plan Note (Signed)
Re check today off hctz bp is good/ no edema or other symptoms

## 2016-06-19 NOTE — Progress Notes (Signed)
Subjective:    Patient ID: MC HOLLEN, female    DOB: 01/12/1940, 76 y.o.   MRN: [id number]  HPI  Here for f/u of chronic medical problems   bp is stable today  No cp or palpitations or headaches or edema  No side effects to medicines  BP Readings from Last 3 Encounters:  06/19/16 126/76  06/05/16 126/70  05/30/16 120/72    No swelling off hctz  Feeling ok       Chemistry      Component Value Date/Time   NA 127 (L) 05/30/2016 0905   K 4.9 05/30/2016 0905   CL 93 (L) 05/30/2016 0905   CO2 30 05/30/2016 0905   BUN 14 05/30/2016 0905   CREATININE 0.84 05/30/2016 0905      Component Value Date/Time   CALCIUM 10.0 05/30/2016 0905   ALKPHOS 66 05/30/2016 0905   AST 16 05/30/2016 0905   ALT 15 05/30/2016 0905   BILITOT 0.7 05/30/2016 0905       Patient Active Problem List   Diagnosis Date Noted  . History of colonic polyps 11/27/2015  . Routine general medical examination at a health care facility 05/30/2015  . Estrogen deficiency 05/30/2015  . Encounter for Medicare annual wellness exam 01/19/2013  . Personal history of colonic polyps 01/19/2013  . Osteopenia 07/29/2011  . Hyponatremia 02/19/2011  . POSTMENOPAUSAL STATUS 04/14/2007  . Diabetes type 2, controlled ([place]) 03/12/2007  . Hyperlipidemia 03/12/2007  . GLAUCOMA 03/12/2007  . Essential hypertension 03/12/2007   Past Medical History:  Diagnosis Date  . Cataract    REMOVED BILATERAL  . Diabetes mellitus    type II  . Glaucoma    BILATERAL  . Hyperlipidemia   . Hypertension   . Osteopenia    Past Surgical History:  Procedure Laterality Date  . ABDOMINAL HYSTERECTOMY     ovaries intact -non cancer done for bleeding   Social History  Substance Use Topics  . Smoking status: Former Smoker    Types: Cigarettes    Quit date: 01/07/2005  . Smokeless tobacco: Never Used  . Alcohol use No   Family History  Problem Relation Age of Onset  . Heart disease Mother        CAD  . Hypertension  Mother   . Heart disease Father        CAD  . Diabetes Brother   . Colon cancer Brother   . Cancer Brother        lymph node CA   No Known Allergies Current Outpatient Prescriptions on File Prior to Visit  Medication Sig Dispense Refill  . ACCU-CHEK AVIVA PLUS test strip TEST BLOOD SUGAR ONCE DAILY AND AS NEEDED (DX. E11.9) 100 each 1  . ACCU-CHEK SOFTCLIX LANCETS lancets USE DEVICE TO TEST BLOOD SUGAR ONCE DAILY AND AS NEEDED.Marland KitchenMarland KitchenDX E11.9 100 each 1  . atorvastatin (LIPITOR) 20 MG tablet Take 1 tablet (20 mg total) by mouth daily. 30 tablet 11  . bisoprolol (ZEBETA) 5 MG tablet Take 1 tablet (5 mg total) by mouth daily. 30 tablet 11  . latanoprost (XALATAN) 0.005 % ophthalmic solution Use eye drops as directed.     . metFORMIN (GLUCOPHAGE) 500 MG tablet Take 1 tablet (500 mg total) by mouth daily. 30 tablet 11  . Multiple Vitamin (MULTIVITAMIN) tablet Take 1 tablet by mouth daily.      . ramipril (ALTACE) 5 MG capsule Take 1 capsule (5 mg total) by mouth daily. 30 capsule 11  .  timolol (TIMOPTIC) 0.5 % ophthalmic solution USE 1 DROP IN BOTH EYES IN THE MORNING  6   Current Facility-Administered Medications on File Prior to Visit  Medication Dose Route Frequency Provider Last Rate Last Dose  . 0.9 %  sodium chloride infusion  500 mL Intravenous Continuous Milus Banister, MD        Review of Systems Review of Systems  Constitutional: Negative for fever, appetite change, fatigue and unexpected weight change.  Eyes: Negative for pain and visual disturbance.  Respiratory: Negative for cough and shortness of breath.   Cardiovascular: Negative for cp or palpitations    Gastrointestinal: Negative for nausea, diarrhea and constipation.  Genitourinary: Negative for urgency and frequency.  Skin: Negative for pallor or rash   Neurological: Negative for weakness, light-headedness, numbness and headaches.  Hematological: Negative for adenopathy. Does not bruise/bleed easily.    Psychiatric/Behavioral: Negative for dysphoric mood. The patient is not nervous/anxious.         Objective:   Physical Exam  Constitutional: She appears well-developed and well-nourished. No distress.  Well appearing   HENT:  Head: Normocephalic and atraumatic.  Mouth/Throat: Oropharynx is clear and moist.  Eyes: Conjunctivae and EOM are normal. Pupils are equal, round, and reactive to light.  Neck: Normal range of motion. Neck supple. No JVD present. Carotid bruit is not present. No thyromegaly present.  Cardiovascular: Normal rate, regular rhythm, normal heart sounds and intact distal pulses.  Exam reveals no gallop.   Pulmonary/Chest: Effort normal and breath sounds normal. No respiratory distress. She has no wheezes. She has no rales.  No crackles  Abdominal: Soft. Bowel sounds are normal. She exhibits no distension, no abdominal bruit and no mass. There is no tenderness.  Musculoskeletal: She exhibits no edema.  Lymphadenopathy:    She has no cervical adenopathy.  Neurological: She is alert. She has normal reflexes.  Skin: Skin is warm and dry. No rash noted.  Psychiatric: She has a normal mood and affect.          Assessment & Plan:   Problem List Items Addressed This Visit      Cardiovascular and Mediastinum   Essential hypertension - Primary    bp in fair control at this time  BP Readings from Last 1 Encounters:  06/19/16 126/76   No changes needed Disc lifstyle change with low sodium diet and exercise  Doing well off hctz  Lab today -(hyponatremia in the past)        Relevant Orders   Basic metabolic panel     Other   Hyponatremia    Re check today off hctz bp is good/ no edema or other symptoms      Relevant Orders   Basic metabolic panel

## 2016-06-24 ENCOUNTER — Other Ambulatory Visit: Payer: Self-pay | Admitting: Family Medicine

## 2016-07-02 ENCOUNTER — Other Ambulatory Visit: Payer: Self-pay | Admitting: Family Medicine

## 2016-07-05 ENCOUNTER — Other Ambulatory Visit (INDEPENDENT_AMBULATORY_CARE_PROVIDER_SITE_OTHER): Payer: Medicare Other

## 2016-07-05 ENCOUNTER — Other Ambulatory Visit: Payer: Self-pay | Admitting: Family Medicine

## 2016-07-05 DIAGNOSIS — E119 Type 2 diabetes mellitus without complications: Secondary | ICD-10-CM

## 2016-07-05 DIAGNOSIS — I10 Essential (primary) hypertension: Secondary | ICD-10-CM

## 2016-07-05 DIAGNOSIS — E7800 Pure hypercholesterolemia, unspecified: Secondary | ICD-10-CM

## 2016-07-05 DIAGNOSIS — E871 Hypo-osmolality and hyponatremia: Secondary | ICD-10-CM

## 2016-07-05 DIAGNOSIS — E7849 Other hyperlipidemia: Secondary | ICD-10-CM

## 2016-07-05 LAB — BASIC METABOLIC PANEL
BUN: 12 mg/dL (ref 6–23)
CHLORIDE: 96 meq/L (ref 96–112)
CO2: 31 meq/L (ref 19–32)
CREATININE: 0.87 mg/dL (ref 0.40–1.20)
Calcium: 10.1 mg/dL (ref 8.4–10.5)
GFR: 67.22 mL/min (ref >60.00)
Glucose, Bld: 157 mg/dL — ABNORMAL HIGH (ref 70–99)
Potassium: 4.7 mEq/L (ref 3.5–5.1)
Sodium: 133 mEq/L — ABNORMAL LOW (ref 135–145)

## 2016-07-08 ENCOUNTER — Other Ambulatory Visit: Payer: Self-pay | Admitting: Family Medicine

## 2016-07-08 ENCOUNTER — Other Ambulatory Visit: Payer: Self-pay | Admitting: *Deleted

## 2016-08-26 ENCOUNTER — Other Ambulatory Visit: Payer: Self-pay | Admitting: Family Medicine

## 2016-09-05 ENCOUNTER — Telehealth: Payer: Self-pay | Admitting: Family Medicine

## 2016-09-05 NOTE — Telephone Encounter (Signed)
Patient Name: Candace Newman  DOB: [birth year]    Initial Comment Caller states grandmother fell yesterday, previously was c/o hip pain prior to fall.   Nurse Assessment  Nurse: Raphael Gibney, RN, Vanita Ingles Date/Time (Eastern Time): 09/05/2016 9:49:40 AM  Confirm and document reason for call. If symptomatic, describe symptoms. ---Caller states grandmother fell yesterday. She is getting weaker. Has pain in left hip and had pain before the fall. She can walk but is uncomfortable. Has had pain for a couple of weeks.  Does the patient have any new or worsening symptoms? ---Yes  Will a triage be completed? ---Yes  Related visit to physician within the last 2 weeks? ---No  Does the PT have any chronic conditions? (i.e. diabetes, asthma, etc.) ---Yes  List chronic conditions. ---prediabetes  Is this a behavioral health or substance abuse call? ---No     Guidelines    Guideline Title Affirmed Question Affirmed Notes  Hip Pain [1] MODERATE pain (e.g., interferes with normal activities, limping) AND [2] present > 3 days    Final Disposition User   See PCP When Office is Open (within 3 days) Raphael Gibney, RN, Vanita Ingles    Comments  appt scheduled for 09/06/2016 at 4 pm with Dr. Loura Pardon   Disagree/Comply: Comply

## 2016-09-05 NOTE — Telephone Encounter (Signed)
I will see her then  

## 2016-09-05 NOTE — Telephone Encounter (Signed)
Pt has appt with Dr Glori Bickers 09/06/16 at 2:15. (1st appt that was convenient for pt).

## 2016-09-06 ENCOUNTER — Ambulatory Visit (INDEPENDENT_AMBULATORY_CARE_PROVIDER_SITE_OTHER)
Admission: RE | Admit: 2016-09-06 | Discharge: 2016-09-06 | Disposition: A | Discharge status: Home / Self Care | Payer: Medicare Other | Source: Ambulatory Visit | Attending: Family Medicine | Admitting: Family Medicine

## 2016-09-06 ENCOUNTER — Ambulatory Visit: Payer: Self-pay | Admitting: Family Medicine

## 2016-09-06 ENCOUNTER — Ambulatory Visit (INDEPENDENT_AMBULATORY_CARE_PROVIDER_SITE_OTHER): Payer: Medicare Other | Admitting: Family Medicine

## 2016-09-06 ENCOUNTER — Encounter: Payer: Self-pay | Admitting: Family Medicine

## 2016-09-06 VITALS — BP 128/84 | HR 70 | Temp 97.9°F | Ht 61.0 in | Wt 115.0 lb

## 2016-09-06 DIAGNOSIS — M25551 Pain in right hip: Secondary | ICD-10-CM

## 2016-09-06 DIAGNOSIS — R29898 Other symptoms and signs involving the musculoskeletal system: Secondary | ICD-10-CM

## 2016-09-06 DIAGNOSIS — M545 Low back pain, unspecified: Secondary | ICD-10-CM | POA: Insufficient documentation

## 2016-09-06 DIAGNOSIS — M5441 Lumbago with sciatica, right side: Secondary | ICD-10-CM | POA: Diagnosis not present

## 2016-09-06 NOTE — Progress Notes (Signed)
Subjective:    Patient ID: Candace Newman, female    DOB: 02-01-1940, 76 y.o.   MRN: [id number]  HPI Here for right hip pain / ? Back   Points to R buttock  Shoots down her leg  Started 1 mo ago - no trauma or reason   Yesterday she had a fall  "pt states she slid actually" She came in her front door , held on to it  Pain and weak R leg caused her to go to the floor  (did not hurt herself)   No numbness   Wt Readings from Last 3 Encounters:  09/06/16 115 lb (52.2 kg)  06/19/16 116 lb 8 oz (52.8 kg)  06/05/16 114 lb 12 oz (52.1 kg)   Patient Active Problem List   Diagnosis Date Noted  . Weakness of right leg 09/06/2016  . Low back pain 09/06/2016  . Right hip pain 09/06/2016  . History of colonic polyps 11/27/2015  . Routine general medical examination at a health care facility 05/30/2015  . Estrogen deficiency 05/30/2015  . Encounter for Medicare annual wellness exam 01/19/2013  . Personal history of colonic polyps 01/19/2013  . Osteopenia 07/29/2011  . Hyponatremia 02/19/2011  . POSTMENOPAUSAL STATUS 04/14/2007  . Diabetes type 2, controlled ([place]) 03/12/2007  . Hyperlipidemia 03/12/2007  . GLAUCOMA 03/12/2007  . Essential hypertension 03/12/2007   Past Medical History:  Diagnosis Date  . Cataract    REMOVED BILATERAL  . Diabetes mellitus    type II  . Glaucoma    BILATERAL  . Hyperlipidemia   . Hypertension   . Osteopenia    Past Surgical History:  Procedure Laterality Date  . ABDOMINAL HYSTERECTOMY     ovaries intact -non cancer done for bleeding   Social History  Substance Use Topics  . Smoking status: Former Smoker    Types: Cigarettes    Quit date: 01/07/2005  . Smokeless tobacco: Never Used  . Alcohol use No   Family History  Problem Relation Age of Onset  . Heart disease Mother        CAD  . Hypertension Mother   . Heart disease Father        CAD  . Diabetes Brother   . Colon cancer Brother   . Cancer Brother        lymph node CA    No Known Allergies Current Outpatient Prescriptions on File Prior to Visit  Medication Sig Dispense Refill  . ACCU-CHEK AVIVA PLUS test strip TEST BLOOD SUGAR ONCE DAILY AND AS NEEDED (DX. E11.9) 100 each 1  . ACCU-CHEK SOFTCLIX LANCETS lancets USE DEVICE TO TEST BLOOD SUGAR ONCE DAILY AND AS NEEDED.Marland KitchenMarland KitchenDX E11.9 100 each 1  . atorvastatin (LIPITOR) 20 MG tablet Take 1 tablet (20 mg total) by mouth daily. 30 tablet 11  . bisoprolol (ZEBETA) 5 MG tablet Take 1 tablet (5 mg total) by mouth daily. 30 tablet 11  . latanoprost (XALATAN) 0.005 % ophthalmic solution Use eye drops as directed.     . metFORMIN (GLUCOPHAGE) 500 MG tablet Take 1 tablet (500 mg total) by mouth daily. 30 tablet 11  . Multiple Vitamin (MULTIVITAMIN) tablet Take 1 tablet by mouth daily.      . timolol (TIMOPTIC) 0.5 % ophthalmic solution USE 1 DROP IN BOTH EYES IN THE MORNING  6   Current Facility-Administered Medications on File Prior to Visit  Medication Dose Route Frequency Provider Last Rate Last Dose  . 0.9 %  sodium chloride infusion  500 mL Intravenous Continuous Milus Banister, MD        Review of Systems Review of Systems  Constitutional: Negative for fever, appetite change, fatigue and unexpected weight change.  Eyes: Negative for pain and visual disturbance.  Respiratory: Negative for cough and shortness of breath.   Cardiovascular: Negative for cp or palpitations    Gastrointestinal: Negative for nausea, diarrhea and constipation.  Genitourinary: Negative for urgency and frequency.  Skin: Negative for pallor or rash   MSK pos for R low back pain and hip pain  Neurological: Negative for weakness, light-headedness, numbness and headaches. pos for R LE weakness  Hematological: Negative for adenopathy. Does not bruise/bleed easily.  Psychiatric/Behavioral: Negative for dysphoric mood. The patient is not nervous/anxious.         Objective:   Physical Exam  Constitutional: She appears well-developed and  well-nourished. No distress.  Well appearing elderly female   HENT:  Head: Normocephalic and atraumatic.  Eyes: Pupils are equal, round, and reactive to light. Conjunctivae and EOM are normal. No scleral icterus.  Neck: Normal range of motion. Neck supple.  Cardiovascular: Normal rate and regular rhythm.   Pulmonary/Chest: Effort normal and breath sounds normal. She has no wheezes. She has no rales.  Abdominal: Soft. Bowel sounds are normal. She exhibits no distension. There is no tenderness.  Musculoskeletal: She exhibits tenderness.       Right shoulder: She exhibits decreased range of motion, tenderness, bony tenderness, spasm and decreased strength. She exhibits no effusion, no crepitus and normal pulse.       Lumbar back: She exhibits decreased range of motion, tenderness and spasm. She exhibits no bony tenderness and no edema.  Tender over L3-L4-L5 Also R lumber musculature  SLR causes R buttock pain   Loss or strength - R hip flex/ plantar flex and great toe dorsiflexion   Pain on R hip ext rotation (in groin)  Lymphadenopathy:    She has no cervical adenopathy.  Neurological: She is alert. She has normal reflexes. She displays no atrophy. No cranial nerve deficit or sensory deficit. She exhibits normal muscle tone. Coordination normal.  SLR pos for R buttock pain   Loss of strength- R hip flex and abd R plantar flex and great toe dorsiflex   Skin: Skin is warm and dry. No rash noted. No erythema. No pallor.  Psychiatric: She has a normal mood and affect.          Assessment & Plan:   Problem List Items Addressed This Visit      Nervous and Auditory   Weakness of right leg    Assoc with R lumbar and hip pain  Suspect radiculopathy  XR of LS (will likely need MRI) Watch for incontinence or worsening of symptoms  Recommend use of a cane to help with safe ambulation        Relevant Orders   DG Lumbar Spine 2-3 Views (Completed)     Other   Low back pain    R  sided with symptoms of R radiculopathy  Some R LE weakness xr now  Will likely need MRI  Disc s/s to watch for  Plan to follow  Adv use of a cane and tylenol for pain       Right hip pain - Primary    Assoc with R lumbar pain  Worse with external rotation  xr of R hip and LS  Adv use of cane on R side for ambulation  Tylenol prn  Plan from there       Relevant Orders   DG Lumbar Spine 2-3 Views (Completed)    Other Visit Diagnoses    Pain of right hip joint       Relevant Orders   DG HIP UNILAT WITH PELVIS 2-3 VIEWS RIGHT (Completed)

## 2016-09-06 NOTE — Patient Instructions (Signed)
X rays of hip and back today  We will call you with results and plan  Tylenol is ok for pain  Heat 10 minutes at a time is ok for pain  Use a cane on the right side for support

## 2016-09-07 NOTE — Assessment & Plan Note (Signed)
Assoc with R lumbar pain  Worse with external rotation  xr of R hip and LS  Adv use of cane on R side for ambulation  Tylenol prn  Plan from there

## 2016-09-07 NOTE — Assessment & Plan Note (Signed)
Assoc with R lumbar and hip pain  Suspect radiculopathy  XR of LS (will likely need MRI) Watch for incontinence or worsening of symptoms  Recommend use of a cane to help with safe ambulation

## 2016-09-07 NOTE — Assessment & Plan Note (Signed)
R sided with symptoms of R radiculopathy  Some R LE weakness xr now  Will likely need MRI  Disc s/s to watch for  Plan to follow  Adv use of a cane and tylenol for pain

## 2016-09-10 ENCOUNTER — Telehealth: Payer: Self-pay | Admitting: Family Medicine

## 2016-09-10 DIAGNOSIS — R29898 Other symptoms and signs involving the musculoskeletal system: Secondary | ICD-10-CM

## 2016-09-10 DIAGNOSIS — M5441 Lumbago with sciatica, right side: Secondary | ICD-10-CM

## 2016-09-10 DIAGNOSIS — M5136 Other intervertebral disc degeneration, lumbar region: Secondary | ICD-10-CM

## 2016-09-10 NOTE — Telephone Encounter (Signed)
Ordered MRI  Will route to [facility] Unsure where she will choose to go so did external order Thanks

## 2016-09-10 NOTE — Telephone Encounter (Signed)
-----   Message from Tammi Sou, [place] sent at 09/10/2016  4:18 PM EDT ----- Pt notified of xray results and Dr. Marliss Coots comments. Pt agrees with MRI, please put order in and I advise pt our [facility] will call to schedule appt

## 2016-09-16 ENCOUNTER — Ambulatory Visit (HOSPITAL_COMMUNITY)
Admission: RE | Admit: 2016-09-16 | Discharge: 2016-09-16 | Disposition: A | Discharge status: Home / Self Care | Payer: Medicare Other | Source: Ambulatory Visit | Attending: Family Medicine | Admitting: Family Medicine

## 2016-09-16 DIAGNOSIS — M5136 Other intervertebral disc degeneration, lumbar region: Secondary | ICD-10-CM | POA: Diagnosis not present

## 2016-09-16 DIAGNOSIS — K8020 Calculus of gallbladder without cholecystitis without obstruction: Secondary | ICD-10-CM | POA: Diagnosis not present

## 2016-09-16 DIAGNOSIS — M5126 Other intervertebral disc displacement, lumbar region: Secondary | ICD-10-CM | POA: Insufficient documentation

## 2016-09-17 ENCOUNTER — Telehealth: Payer: Self-pay | Admitting: Family Medicine

## 2016-09-17 DIAGNOSIS — M5441 Lumbago with sciatica, right side: Secondary | ICD-10-CM

## 2016-09-17 DIAGNOSIS — R29898 Other symptoms and signs involving the musculoskeletal system: Secondary | ICD-10-CM

## 2016-09-17 NOTE — Telephone Encounter (Signed)
Ref done  Will route to [facility] 

## 2016-09-17 NOTE — Telephone Encounter (Signed)
-----   Message from Tammi Sou, [place] sent at 09/17/2016  3:02 PM EDT ----- [facility] called back (see prev note) she discussed the results with pt and they do want to proceed with the neuro surgeon referral, please call Granddaughter to schedule referral

## 2016-09-18 NOTE — Telephone Encounter (Signed)
Spoke to [employer], gave her contact info for [facility], toldher the referral will be sent over today and they will call patient directly.

## 2016-10-07 ENCOUNTER — Telehealth: Payer: Self-pay

## 2016-10-07 NOTE — Telephone Encounter (Signed)
I will route to [facility]

## 2016-10-07 NOTE — Telephone Encounter (Signed)
Candace Newman pts granddaughter left v/m that pt is getting weaker; referral to neurosurgeon was done on 09/19/16. Candace Newman has not heard anything about appt. Candace Newman request cb,

## 2016-10-08 NOTE — Telephone Encounter (Signed)
Just got the Appt faxed over, Patient will be seen Thursday by Dr Cyndy Freeze at [facility] and patient is aware.

## 2016-10-08 NOTE — Telephone Encounter (Signed)
That sounds great, thanks! 

## 2016-10-08 NOTE — Telephone Encounter (Signed)
[facility] and spoke with Thayer Headings there. They are 3 weeks behind in scheduling for all patients. I did leave a message with the Dr's secretary that Ms Macadam is getting weaker and we would like to try to expedite this referral. I called Estill Bamberg the granddaughter and told her what I did also. She also called and left a message with the secretary about her grandmothers condition. Hopefully with receiving our messages she will be given an appt soon.

## 2016-10-08 NOTE — Telephone Encounter (Signed)
Thanks

## 2016-11-18 ENCOUNTER — Telehealth: Payer: Self-pay

## 2016-11-18 NOTE — Telephone Encounter (Signed)
Pt is scheduled for lab appt and flu shot on 11/21/16.

## 2016-11-18 NOTE — Telephone Encounter (Signed)
Copied from [place] [id number]. Topic: Appointment Scheduling - Scheduling Inquiry for Clinic >> Nov 18, 2016  4:11 PM Bea Graff, NT wrote: Reason for CRM: Patients granddaughter is calling stating her grandmother would like her labs drawn (did not specify which labs) and a flu shot at the same time. Do not see order for labs. Please call to schedule.

## 2016-11-21 ENCOUNTER — Other Ambulatory Visit (INDEPENDENT_AMBULATORY_CARE_PROVIDER_SITE_OTHER): Payer: Medicare Other

## 2016-11-21 ENCOUNTER — Ambulatory Visit (INDEPENDENT_AMBULATORY_CARE_PROVIDER_SITE_OTHER): Payer: Medicare Other

## 2016-11-21 DIAGNOSIS — Z23 Encounter for immunization: Secondary | ICD-10-CM | POA: Diagnosis not present

## 2016-11-21 DIAGNOSIS — E7849 Other hyperlipidemia: Secondary | ICD-10-CM

## 2016-11-21 DIAGNOSIS — I10 Essential (primary) hypertension: Secondary | ICD-10-CM

## 2016-11-21 DIAGNOSIS — E119 Type 2 diabetes mellitus without complications: Secondary | ICD-10-CM

## 2016-11-21 LAB — LIPID PANEL
Cholesterol: 122 mg/dL (ref 0–200)
HDL: 40.6 mg/dL (ref >39.00)
LDL Cholesterol: 62 mg/dL (ref 0–99)
NONHDL: 81.66
Total CHOL/HDL Ratio: 3
Triglycerides: 99 mg/dL (ref 0.0–149.0)
VLDL: 19.8 mg/dL (ref 0.0–40.0)

## 2016-11-21 LAB — COMPREHENSIVE METABOLIC PANEL
ALT: 16 U/L (ref 0–35)
AST: 17 U/L (ref 0–37)
Albumin: 4.4 g/dL (ref 3.5–5.2)
Alkaline Phosphatase: 66 U/L (ref 39–117)
BUN: 11 mg/dL (ref 6–23)
CALCIUM: 10 mg/dL (ref 8.4–10.5)
CO2: 33 mEq/L — ABNORMAL HIGH (ref 19–32)
Chloride: 100 mEq/L (ref 96–112)
Creatinine, Ser: 0.8 mg/dL (ref 0.40–1.20)
GFR: 73.98 mL/min (ref >60.00)
GLUCOSE: 147 mg/dL — AB (ref 70–99)
POTASSIUM: 5.3 meq/L — AB (ref 3.5–5.1)
Sodium: 137 mEq/L (ref 135–145)
Total Bilirubin: 0.7 mg/dL (ref 0.2–1.2)
Total Protein: 6.8 g/dL (ref 6.0–8.3)

## 2016-11-21 LAB — HEMOGLOBIN A1C: HEMOGLOBIN A1C: 6.4 % (ref 4.6–6.5)

## 2016-12-02 ENCOUNTER — Ambulatory Visit: Payer: Medicare Other

## 2016-12-27 ENCOUNTER — Telehealth: Payer: Self-pay | Admitting: *Deleted

## 2016-12-27 MED ORDER — ATENOLOL 25 MG PO TABS: 25.0000 mg | ORAL_TABLET | Freq: Every day | ORAL | 11 refills | Status: AC

## 2016-12-27 NOTE — Telephone Encounter (Signed)
Received fax saying that all strengths of bisoprolol is on back order and pharmacy is requesting a substitute Rx sent in for pt, (pt is out of med)

## 2016-12-27 NOTE — Telephone Encounter (Signed)
Sending atenolol Alert me if any problems

## 2017-02-25 ENCOUNTER — Other Ambulatory Visit: Payer: Self-pay | Admitting: Family Medicine

## 2017-03-28 ENCOUNTER — Emergency Department (HOSPITAL_COMMUNITY): Payer: Medicare Other

## 2017-03-28 ENCOUNTER — Other Ambulatory Visit: Payer: Self-pay

## 2017-03-28 ENCOUNTER — Encounter (HOSPITAL_COMMUNITY): Payer: Self-pay

## 2017-03-28 ENCOUNTER — Inpatient Hospital Stay (HOSPITAL_COMMUNITY)
Admission: EM | Admit: 2017-03-28 | Discharge: 2017-04-04 | DRG: 481 | Disposition: A | Discharge status: Skilled Nursing Facility | Payer: Medicare Other | Attending: Family Medicine | Admitting: Family Medicine

## 2017-03-28 DIAGNOSIS — Z09 Encounter for follow-up examination after completed treatment for conditions other than malignant neoplasm: Secondary | ICD-10-CM

## 2017-03-28 DIAGNOSIS — E785 Hyperlipidemia, unspecified: Secondary | ICD-10-CM | POA: Diagnosis present

## 2017-03-28 DIAGNOSIS — S72111A Displaced fracture of greater trochanter of right femur, initial encounter for closed fracture: Secondary | ICD-10-CM

## 2017-03-28 DIAGNOSIS — D72829 Elevated white blood cell count, unspecified: Secondary | ICD-10-CM | POA: Diagnosis present

## 2017-03-28 DIAGNOSIS — M25551 Pain in right hip: Secondary | ICD-10-CM | POA: Diagnosis present

## 2017-03-28 DIAGNOSIS — Y92531 Health care provider office as the place of occurrence of the external cause: Secondary | ICD-10-CM

## 2017-03-28 DIAGNOSIS — S72009A Fracture of unspecified part of neck of unspecified femur, initial encounter for closed fracture: Secondary | ICD-10-CM | POA: Diagnosis present

## 2017-03-28 DIAGNOSIS — Z01810 Encounter for preprocedural cardiovascular examination: Secondary | ICD-10-CM

## 2017-03-28 DIAGNOSIS — S72141A Displaced intertrochanteric fracture of right femur, initial encounter for closed fracture: Secondary | ICD-10-CM | POA: Diagnosis present

## 2017-03-28 DIAGNOSIS — E7800 Pure hypercholesterolemia, unspecified: Secondary | ICD-10-CM | POA: Diagnosis not present

## 2017-03-28 DIAGNOSIS — Z7984 Long term (current) use of oral hypoglycemic drugs: Secondary | ICD-10-CM | POA: Diagnosis not present

## 2017-03-28 DIAGNOSIS — I10 Essential (primary) hypertension: Secondary | ICD-10-CM | POA: Diagnosis present

## 2017-03-28 DIAGNOSIS — M8580 Other specified disorders of bone density and structure, unspecified site: Secondary | ICD-10-CM | POA: Diagnosis present

## 2017-03-28 DIAGNOSIS — R0989 Other specified symptoms and signs involving the circulatory and respiratory systems: Secondary | ICD-10-CM

## 2017-03-28 DIAGNOSIS — D72828 Other elevated white blood cell count: Secondary | ICD-10-CM | POA: Diagnosis present

## 2017-03-28 DIAGNOSIS — J449 Chronic obstructive pulmonary disease, unspecified: Secondary | ICD-10-CM | POA: Diagnosis not present

## 2017-03-28 DIAGNOSIS — Z419 Encounter for procedure for purposes other than remedying health state, unspecified: Secondary | ICD-10-CM

## 2017-03-28 DIAGNOSIS — E119 Type 2 diabetes mellitus without complications: Secondary | ICD-10-CM

## 2017-03-28 DIAGNOSIS — W010XXA Fall on same level from slipping, tripping and stumbling without subsequent striking against object, initial encounter: Secondary | ICD-10-CM | POA: Diagnosis present

## 2017-03-28 DIAGNOSIS — S72001A Fracture of unspecified part of neck of right femur, initial encounter for closed fracture: Secondary | ICD-10-CM

## 2017-03-28 DIAGNOSIS — Z87891 Personal history of nicotine dependence: Secondary | ICD-10-CM

## 2017-03-28 DIAGNOSIS — N179 Acute kidney failure, unspecified: Secondary | ICD-10-CM | POA: Diagnosis present

## 2017-03-28 DIAGNOSIS — Z23 Encounter for immunization: Secondary | ICD-10-CM | POA: Diagnosis not present

## 2017-03-28 DIAGNOSIS — E222 Syndrome of inappropriate secretion of antidiuretic hormone: Secondary | ICD-10-CM | POA: Diagnosis not present

## 2017-03-28 DIAGNOSIS — H409 Unspecified glaucoma: Secondary | ICD-10-CM | POA: Diagnosis present

## 2017-03-28 DIAGNOSIS — Z90710 Acquired absence of both cervix and uterus: Secondary | ICD-10-CM | POA: Diagnosis not present

## 2017-03-28 DIAGNOSIS — Z720 Tobacco use: Secondary | ICD-10-CM | POA: Diagnosis present

## 2017-03-28 DIAGNOSIS — Z833 Family history of diabetes mellitus: Secondary | ICD-10-CM

## 2017-03-28 DIAGNOSIS — N390 Urinary tract infection, site not specified: Secondary | ICD-10-CM | POA: Diagnosis not present

## 2017-03-28 DIAGNOSIS — E871 Hypo-osmolality and hyponatremia: Secondary | ICD-10-CM | POA: Diagnosis not present

## 2017-03-28 DIAGNOSIS — Z8249 Family history of ischemic heart disease and other diseases of the circulatory system: Secondary | ICD-10-CM | POA: Diagnosis not present

## 2017-03-28 DIAGNOSIS — D62 Acute posthemorrhagic anemia: Secondary | ICD-10-CM | POA: Diagnosis not present

## 2017-03-28 LAB — PROTIME-INR
INR: 1.12
Prothrombin Time: 14.3 seconds (ref 11.4–15.2)

## 2017-03-28 LAB — CBC WITH DIFFERENTIAL/PLATELET
BASOS ABS: 0 10*3/uL (ref 0.0–0.1)
Basophils Relative: 0 %
EOS PCT: 0 %
Eosinophils Absolute: 0.1 10*3/uL (ref 0.0–0.7)
HEMATOCRIT: 43.1 % (ref 36.0–46.0)
Hemoglobin: 14.2 g/dL (ref 12.0–15.0)
LYMPHS PCT: 5 %
Lymphs Abs: 0.9 10*3/uL (ref 0.7–4.0)
MCH: 29.9 pg (ref 26.0–34.0)
MCHC: 32.9 g/dL (ref 30.0–36.0)
MCV: 90.7 fL (ref 78.0–100.0)
MONO ABS: 0.8 10*3/uL (ref 0.1–1.0)
MONOS PCT: 4 %
NEUTROS ABS: 16.7 10*3/uL — AB (ref 1.7–7.7)
Neutrophils Relative %: 91 %
PLATELETS: 245 10*3/uL (ref 150–400)
RBC: 4.75 MIL/uL (ref 3.87–5.11)
RDW: 12.5 % (ref 11.5–15.5)
WBC: 18.5 10*3/uL — ABNORMAL HIGH (ref 4.0–10.5)

## 2017-03-28 LAB — BASIC METABOLIC PANEL
Anion gap: 12 (ref 5–15)
BUN: 11 mg/dL (ref 6–20)
CALCIUM: 9.5 mg/dL (ref 8.9–10.3)
CO2: 27 mmol/L (ref 22–32)
Chloride: 97 mmol/L — ABNORMAL LOW (ref 101–111)
Creatinine, Ser: 0.79 mg/dL (ref 0.44–1.00)
GFR calc Af Amer: >60 mL/min (ref >60)
Glucose, Bld: 154 mg/dL — ABNORMAL HIGH (ref 65–99)
POTASSIUM: 3.5 mmol/L (ref 3.5–5.1)
Sodium: 136 mmol/L (ref 135–145)

## 2017-03-28 MED ORDER — METHOCARBAMOL 500 MG PO TABS
500.0000 mg | ORAL_TABLET | Freq: Four times a day (QID) | ORAL | Status: DC | PRN
  Administered 2017-03-29 – 2017-03-30 (×2): 500 mg via ORAL
  Filled 2017-03-28 (×2): qty 1

## 2017-03-28 MED ORDER — MORPHINE SULFATE (PF) 4 MG/ML IV SOLN
4.0000 mg | INTRAVENOUS | Status: AC | PRN
  Administered 2017-03-28 (×2): 4 mg via INTRAVENOUS
  Filled 2017-03-28 (×2): qty 1

## 2017-03-28 MED ORDER — ATORVASTATIN CALCIUM 20 MG PO TABS
20.0000 mg | ORAL_TABLET | Freq: Every day | ORAL | Status: DC
  Administered 2017-03-30 – 2017-04-04 (×6): 20 mg via ORAL
  Filled 2017-03-28 (×6): qty 1

## 2017-03-28 MED ORDER — POTASSIUM CHLORIDE CRYS ER 20 MEQ PO TBCR
40.0000 meq | EXTENDED_RELEASE_TABLET | Freq: Once | ORAL | Status: AC
  Administered 2017-03-28: 40 meq via ORAL
  Filled 2017-03-28: qty 2

## 2017-03-28 MED ORDER — NICOTINE 21 MG/24HR TD PT24: 21.0000 mg | MEDICATED_PATCH | Freq: Every day | TRANSDERMAL | Status: DC

## 2017-03-28 MED ORDER — TIMOLOL MALEATE 0.5 % OP SOLN
1.0000 [drp] | Freq: Every day | OPHTHALMIC | Status: DC
  Administered 2017-03-30 – 2017-04-04 (×6): 1 [drp] via OPHTHALMIC
  Filled 2017-03-28: qty 5

## 2017-03-28 MED ORDER — LATANOPROST 0.005 % OP SOLN
1.0000 [drp] | Freq: Every day | OPHTHALMIC | Status: DC
  Administered 2017-03-29 – 2017-04-03 (×6): 1 [drp] via OPHTHALMIC
  Filled 2017-03-28 (×2): qty 2.5

## 2017-03-28 MED ORDER — TETANUS-DIPHTH-ACELL PERTUSSIS 5-2.5-18.5 LF-MCG/0.5 IM SUSP
0.5000 mL | Freq: Once | INTRAMUSCULAR | Status: AC
  Administered 2017-03-28: 0.5 mL via INTRAMUSCULAR
  Filled 2017-03-28: qty 0.5

## 2017-03-28 MED ORDER — ATENOLOL 50 MG PO TABS
25.0000 mg | ORAL_TABLET | Freq: Every day | ORAL | Status: DC
  Administered 2017-03-30 – 2017-04-04 (×6): 25 mg via ORAL
  Filled 2017-03-28 (×6): qty 1

## 2017-03-28 MED ORDER — MORPHINE SULFATE (PF) 2 MG/ML IV SOLN
0.5000 mg | INTRAVENOUS | Status: DC | PRN
  Administered 2017-03-29: 0.5 mg via INTRAVENOUS
  Filled 2017-03-28 (×2): qty 1

## 2017-03-28 MED ORDER — HYDROCODONE-ACETAMINOPHEN 5-325 MG PO TABS: 1.0000 | ORAL_TABLET | Freq: Four times a day (QID) | ORAL | Status: DC | PRN

## 2017-03-28 MED ORDER — METHOCARBAMOL 1000 MG/10ML IJ SOLN: 500.0000 mg | Freq: Four times a day (QID) | INTRAVENOUS | Status: DC | PRN

## 2017-03-28 MED ORDER — INSULIN ASPART 100 UNIT/ML ~~LOC~~ SOLN
0.0000 [IU] | SUBCUTANEOUS | Status: DC
  Administered 2017-03-29: 1 [IU] via SUBCUTANEOUS
  Administered 2017-03-29: 3 [IU] via SUBCUTANEOUS
  Administered 2017-03-29: 2 [IU] via SUBCUTANEOUS

## 2017-03-28 MED ORDER — SODIUM CHLORIDE 0.9 % IV SOLN
INTRAVENOUS | Status: AC
  Administered 2017-03-29: 02:00:00 via INTRAVENOUS

## 2017-03-28 MED ORDER — MORPHINE SULFATE (PF) 2 MG/ML IV SOLN
2.0000 mg | Freq: Once | INTRAVENOUS | Status: AC
  Administered 2017-03-28: 2 mg via INTRAVENOUS

## 2017-03-28 NOTE — ED Notes (Signed)
Patient transported to X-ray 

## 2017-03-28 NOTE — ED Triage Notes (Signed)
MECHANICAL FALL

## 2017-03-28 NOTE — ED Notes (Signed)
EDPA Provider at bedside. 

## 2017-03-28 NOTE — ED Notes (Signed)
Bed: WHALB Expected date:  Expected time:  Means of arrival:  Comments: 

## 2017-03-28 NOTE — ED Notes (Signed)
Bed: WA09 Expected date:  Expected time:  Means of arrival:  Comments: HALL B

## 2017-03-28 NOTE — ED Provider Notes (Signed)
Patient is a 77 year old female received a signout from [place] pending labs and imaging results.  Per her HPI:  "Patient is a 77 year old female with a history of type 2 diabetes mellitus, hyperlipidemia, hypertension, osteopenia, and glaucoma presenting for right hip pain after a fall today.  Patient reports that she was at her eye physician's office and leaving the office, when she tripped over a rug coming out of a door frame.  Patient fell down onto her right elbow, right hip and right leg.  Patient denies loss of consciousness, head injury or neck trauma.  Patient does not take blood thinning medications.  Patient reports that she has exquisite pain in her right groin at this time and cannot fully extend her leg due to the pain.  Patient denies loss of sensation distal to the injury.  Patient also reporting she is small open wound on her right elbow.  Patient denies chest pain, shortness breath.  Patient does report that she has nausea, but has not vomited yet."  Physical Exam  BP (!) 167/97   Pulse 93   Temp 97.7 F (36.5 C) (Oral)   Resp 14   SpO2 93%   Physical Exam  NAD A&O x3  ED Course/Procedures     Procedures  MDM   77 year old female with a history of type 2 diabetes mellitus, hyperlipidemia, hypertension, osteopenia, and glaucoma presenting with right hip pain after a mechanical fall today.  Patient was received at signout from [place] pending labs and imaging.  Right hip x-ray with acute mildly displaced right intertrochanteric fracture.  Imaging is otherwise negative for acute pathology.   Tdap updated.  CBC with leukocytosis of 18.5 and neutrophil count of 6.7, likely secondary to acute trauma.  BMP is significant for glucose of 154.  Type and screen is pending.  BP 167/97, likely secondary to pain.   Spoke with Dr. Griffin Basil with orthopedic surgery who recommended transferring the patient to [facility] for admission and intraoperative repair planned for tomorrow once  she was medically cleared. Dr. Roel Cluck from the hospitalist team will admit. The patient appears reasonably stabilized for admission considering the current resources, flow, and capabilities available in the ED at this time, and I doubt any other [facility] requiring further screening and/or treatment in the ED prior to admission.   Joline Maxcy A, PA-C 03/28/17 2050    Margette Fast, MD 03/29/17 [phone]

## 2017-03-28 NOTE — ED Notes (Signed)
Pt has a purewick

## 2017-03-28 NOTE — ED Notes (Signed)
ED TO INPATIENT HANDOFF REPORT  Name/Age/Gender Candace Newman 77 y.o. female  Code Status   Home/SNF/Other Home  Chief Complaint Hip Pain  Level of Care/Admitting Diagnosis ED Disposition    ED Disposition Condition [facility] Area: [place] [[id number]]  Level of Care: Med-Surg [16]  Diagnosis: Hip fracture [facility]) [[id number]]  Admitting Physician: Toy Baker [[id number]]  Attending Physician: Toy Baker [[id number]]  Estimated length of stay: 3 - 4 days  Certification:: I certify this patient will need inpatient services for at least 2 midnights  PT Class (Do Not Modify): Inpatient [101]  PT Acc Code (Do Not Modify): Private [1]       Medical History Past Medical History:  Diagnosis Date  . Cataract    REMOVED BILATERAL  . Diabetes mellitus    type II  . Glaucoma    BILATERAL  . Hyperlipidemia   . Hypertension   . Osteopenia     Allergies No Known Allergies  IV Location/Drains/Wounds Patient Lines/Drains/Airways Status   Active Line/Drains/Airways    Name:   Placement date:   Placement time:   Site:   Days:   Peripheral IV 03/28/17 Left Antecubital   03/28/17    1443    Antecubital   less than 1          Labs/Imaging Results for orders placed or performed during the hospital encounter of 03/28/17 (from the past 48 hour(s))  Basic metabolic panel     Status: Abnormal   Collection Time: 03/28/17  4:19 PM  Result Value Ref Range   Sodium 136 135 - 145 mmol/L   Potassium 3.5 3.5 - 5.1 mmol/L   Chloride 97 (L) 101 - 111 mmol/L   CO2 27 22 - 32 mmol/L   Glucose, Bld 154 (H) 65 - 99 mg/dL   BUN 11 6 - 20 mg/dL   Creatinine, Ser 0.79 0.44 - 1.00 mg/dL   Calcium 9.5 8.9 - 10.3 mg/dL   GFR calc non Af Amer >60 >60 mL/min   GFR calc Af Amer >60 >60 mL/min    Comment: (NOTE) The eGFR has been calculated using the CKD EPI equation. This calculation has not been validated in all clinical situations. eGFR's  persistently <60 mL/min signify possible Chronic Kidney Disease.    Anion gap 12 5 - 15    Comment: Performed at [facility], [place] [address] [place], [place] [address]  CBC WITH DIFFERENTIAL     Status: Abnormal   Collection Time: 03/28/17  4:19 PM  Result Value Ref Range   WBC 18.5 (H) 4.0 - 10.5 K/uL   RBC 4.75 3.87 - 5.11 MIL/uL   Hemoglobin 14.2 12.0 - 15.0 g/dL   HCT 43.1 36.0 - 46.0 %   MCV 90.7 78.0 - 100.0 fL   MCH 29.9 26.0 - 34.0 pg   MCHC 32.9 30.0 - 36.0 g/dL   RDW 12.5 11.5 - 15.5 %   Platelets 245 150 - 400 K/uL   Neutrophils Relative % 91 %   Neutro Abs 16.7 (H) 1.7 - 7.7 K/uL   Lymphocytes Relative 5 %   Lymphs Abs 0.9 0.7 - 4.0 K/uL   Monocytes Relative 4 %   Monocytes Absolute 0.8 0.1 - 1.0 K/uL   Eosinophils Relative 0 %   Eosinophils Absolute 0.1 0.0 - 0.7 K/uL   Basophils Relative 0 %   Basophils Absolute 0.0 0.0 - 0.1 K/uL    Comment: Performed at  [facility], [place] [address] [place], [place] [address]  Protime-INR     Status: None   Collection Time: 03/28/17  4:19 PM  Result Value Ref Range   Prothrombin Time 14.3 11.4 - 15.2 seconds   INR 1.12     Comment: Performed at [facility], [place] [address], [place], [place] [address]   Dg Lumbar Spine Complete  Result Date: 03/28/2017 CLINICAL DATA:  Fall with pelvic pain EXAM: LUMBAR SPINE - COMPLETE 4+ VIEW COMPARISON:  MRI 09/16/2016 FINDINGS: Five non rib-bearing lumbar type vertebra. Grade 1 anterolisthesis of L5 on S1. Moderate degenerative changes at L5-S1. Vertebral body heights are maintained. Mild degenerative changes at L1-L2 and L2-L3. Aortic atherosclerosis. IMPRESSION: Grade 1 anterolisthesis of L5 on S1, similar compared to 2018 MRI. No definite acute osseous abnormality. Electronically Signed   By: Donavan Foil M.D.   On: 03/28/2017 17:37   Dg Elbow Complete Right  Result Date: 03/28/2017 CLINICAL DATA:  Fall with elbow pain EXAM:  RIGHT ELBOW - COMPLETE 3+ VIEW COMPARISON:  None. FINDINGS: There is no evidence of fracture, dislocation, or joint effusion. There is no evidence of arthropathy or other focal bone abnormality. Soft tissues are unremarkable. IMPRESSION: Negative. Electronically Signed   By: Donavan Foil M.D.   On: 03/28/2017 17:40   Dg Ankle Complete Right  Result Date: 03/28/2017 CLINICAL DATA:  Fall with pain EXAM: RIGHT ANKLE - COMPLETE 3+ VIEW COMPARISON:  None. FINDINGS: No acute displaced fracture or malalignment is seen. Ankle mortise is symmetric. Mild degenerative changes. IMPRESSION: No acute osseous abnormality Electronically Signed   By: Donavan Foil M.D.   On: 03/28/2017 17:40   Dg Knee Complete 4 Views Right  Result Date: 03/28/2017 CLINICAL DATA:  Fall EXAM: RIGHT KNEE - COMPLETE 4+ VIEW COMPARISON:  07/01/2011 FINDINGS: No acute displaced fracture or malalignment. Mild patellofemoral degenerative changes with spurring. Trace knee effusion. Mild to moderate degenerative changes at the lateral compartment. Mild degenerative changes at the medial compartment. IMPRESSION: Degenerative changes.  No acute osseous abnormality. Electronically Signed   By: Donavan Foil M.D.   On: 03/28/2017 17:39   Dg Hip Unilat With Pelvis 2-3 Views Right  Result Date: 03/28/2017 CLINICAL DATA:  Fall right hip pain EXAM: DG HIP (WITH OR WITHOUT PELVIS) 2-3V RIGHT COMPARISON:  09/06/2016 FINDINGS: Pubic symphysis and rami are intact. Right femoral head projects in joint. Acute mildly displaced intertrochanteric fracture. IMPRESSION: Acute mildly displaced right intertrochanteric fracture. Electronically Signed   By: Donavan Foil M.D.   On: 03/28/2017 17:33    Pending Labs Unresulted Labs (From admission, onward)   Start     Ordered   03/28/17 1849  Type and screen [place]  Once,   STAT    Comments:  [place]    03/28/17 1848   03/28/17 1441  Urinalysis, Routine w reflex  microscopic  STAT,   STAT     03/28/17 1442   Signed and Held  Hemoglobin A1c  Once,   R    Comments:  To assess prior glycemic control    Signed and Held   Signed and Held  CBC  Tomorrow morning,   R     Signed and Held   Signed and Held  Basic metabolic panel  Tomorrow morning,   R     Signed and Held   Signed and Held  VITAMIN D 25 Hydroxy (Vit-D Deficiency, Fractures)  Tomorrow morning,   R  Signed and Held   Signed and Held  Albumin  Tomorrow morning,   R     Signed and Held      Vitals/Pain Today's Vitals   03/28/17 1830 03/28/17 1930 03/28/17 1951 03/28/17 2030  BP: (!) 162/95 (!) 180/110 (!) 180/95 (!) 167/97  Pulse: 89 91  93  Resp: 15 (!) 24  14  Temp:      TempSrc:      SpO2: 95% 96%  93%  PainSc:        Isolation Precautions No active isolations  Medications Medications  morphine 4 MG/ML injection 4 mg (4 mg Intravenous Given 03/28/17 1805)  Tdap (BOOSTRIX) injection 0.5 mL (0.5 mLs Intramuscular Given 03/28/17 1757)  potassium chloride SA (K-DUR,KLOR-CON) CR tablet 40 mEq (40 mEq Oral Given 03/28/17 1958)    Mobility walks with device

## 2017-03-28 NOTE — ED Notes (Signed)
[facility] called. 

## 2017-03-28 NOTE — ED Triage Notes (Addendum)
Per [facility]- Pt leaving Dr.MECHANICAL FALL.  Office tripped over rug while using her cane landing on rt hip. Denies neck and back pain. NO LOC. Pt unable to straighten RLE. Pulses present. EDPA at bedside upon arrival. 50MCG IVP. Fentanyl given

## 2017-03-28 NOTE — H&P (Signed)
Candace Newman FMB:[id number] DOB: [birth year] DOA: 03/28/2017     PCP: Abner Greenspan, MD   Outpatient Specialists: none      Patient arrived to ER on 03/28/17 at 1422  Patient coming from:   home Lives  With family    Chief Complaint:  Chief Complaint  Patient presents with  . Fall  . Hip Pain    RIGHT HIP    HPI: Candace Newman is a 77 y.o. female with medical history significant of HTN, DM2, tobacco abuse, HLD, osteopenia    Presented with mechanical fall that occurred today she tripped over a rug while using cane to ambulate and landed on her right hip this occurred while at the eye doctor office.  On any blood thinners no LOC EMS was called patient arrived to ER Reports severe pain in her right hip unable to move the right hip area she also has hurt her elbow with some abrasions.  Otherwise no associated chest pain shortness of breath. Denies an exertional chest pain. She mops her floors and does all the cooking and laundry with out chest pain or shortness of breath. Prefers not to walk long distance for fear of falling.  She has no hx of CAD.    While in ER: Tdap updated To be hypertensive up to 187/91 felt to be secondary to pain Right lower extremity appears to be neurovascularly intact Significant initial  Findings: Abnormal Labs Reviewed  BASIC METABOLIC PANEL - Abnormal; Notable for the following components:      Result Value   Chloride 97 (*)    Glucose, Bld 154 (*)    All other components within normal limits  CBC WITH DIFFERENTIAL/PLATELET - Abnormal; Notable for the following components:   WBC 18.5 (*)    Neutro Abs 16.7 (*)    All other components within normal limits     Na 136 K 3.5  Cr    stable,   Lab Results  Component Value Date   CREATININE 0.79 03/28/2017   CREATININE 0.80 11/21/2016   CREATININE 0.87 07/05/2016      WBC 18.5  HG/HCT  stable,      Component Value Date/Time   HGB 14.2 03/28/2017 1619   HCT 43.1 03/28/2017  1619    INR 1.12   UA  not ordered  Lumbar spine grade 1 anterolisthesis of L5-S1 unchanged from prior Right knee nonacute Right ankle nonacute Right elbow nonacute    X-ray of right hip showing intertrochanteric fracture mildly displaced    ECG:  Personally reviewed by me showing: HR : 82 Rhythm:  NSR,   Ischemic changes  no evidence of ischemic changes QTC 475   Temp (24hrs), Avg:97.7 F (36.5 C), Min:97.7 F (36.5 C), Max:97.7 F (36.5 C) LABRCNTIP(cktotal:5,CKMB:5,ckmbindex:5,troponini:5) ED Triage Vitals  Enc Vitals Group     BP 03/28/17 1527 (!) 184/137     Pulse Rate 03/28/17 1527 80     Resp 03/28/17 1527 18     Temp 03/28/17 1527 97.7 F (36.5 C)     Temp Source 03/28/17 1527 Oral     SpO2 03/28/17 1443 96 %     Weight --      Height --      Head Circumference --      Peak Flow --      Pain Score 03/28/17 1442 7     Pain Loc --      Pain Edu? --  Excl. in [place]? --   TMAX(24)@     on arrival    Latest  Blood pressure (!) 187/91, pulse 86, temperature 97.7 F (36.5 C), temperature source Oral, resp. rate 13, SpO2 95 %.   Following Medications were ordered in ER: Medications  morphine 4 MG/ML injection 4 mg (4 mg Intravenous Given 03/28/17 1805)  Tdap (BOOSTRIX) injection 0.5 mL (0.5 mLs Intramuscular Given 03/28/17 1757)     ER Provider Called: Dr. Griffin Basil with [facility]    They Recommend transferred to [facility] for OR intervention in a.m. Will see in AM or in ER  Hospitalist was called for admission for right hip fracture  Regarding pertinent Chronic problems: Diabetes mellitus on metformin not on insulin History of hypertension on atenolol  no known history of coronary artery disease  Review of Systems:    Pertinent positives include: Right hip pain, nausea, arthralgia  Constitutional:  No weight loss, night sweats, Fevers, chills, fatigue, weight loss  HEENT:  No headaches, Difficulty swallowing,Tooth/dental problems,Sore  throat,  No sneezing, itching, ear ache, nasal congestion, post nasal drip,  Cardio-vascular:  No chest pain, Orthopnea, PND, anasarca, dizziness, palpitations.no Bilateral lower extremity swelling  GI:  No heartburn, indigestion, abdominal pain, vomiting, diarrhea, change in bowel habits, loss of appetite, melena, blood in stool, hematemesis Resp:  no shortness of breath at rest. No dyspnea on exertion, No excess mucus, no productive cough, No non-productive cough, No coughing up of blood.No change in color of mucus.No wheezing. Skin:  no rash or lesions. No jaundice GU:  no dysuria, change in color of urine, no urgency or frequency. No straining to urinate.  No flank pain.  Musculoskeletal:  No joint pain or no joint swelling. No decreased range of motion. No back pain.  Psych:  No change in mood or affect. No depression or anxiety. No memory loss.  Neuro: no localizing neurological complaints, no tingling, no weakness, no double vision, no gait abnormality, no slurred speech, no confusion  As per HPI otherwise 10 point review of systems negative.   Past Medical History:  Past Medical History:  Diagnosis Date  . Cataract    REMOVED BILATERAL  . Diabetes mellitus    type II  . Glaucoma    BILATERAL  . Hyperlipidemia   . Hypertension   . Osteopenia       Past Surgical History:  Procedure Laterality Date  . ABDOMINAL HYSTERECTOMY     ovaries intact -non cancer done for bleeding    Social History:  Ambulatory   cane,      reports that she quit smoking about 12 years ago. Her smoking use included cigarettes. She has never used smokeless tobacco. She reports that she does not drink alcohol or use drugs.     Family History:   Family History  Problem Relation Age of Onset  . Heart disease Mother        CAD  . Hypertension Mother   . Heart disease Father        CAD  . Diabetes Brother   . Colon cancer Brother   . Cancer Brother        lymph node CA     Allergies: No Known Allergies  Prior to Admission medications   Medication Sig Start Date End Date Taking? Authorizing Provider  atenolol (TENORMIN) 25 MG tablet Take 1 tablet (25 mg total) by mouth daily. 12/27/16  Yes [name], [name], MD  atorvastatin (LIPITOR) 20 MG tablet Take 1  tablet (20 mg total) by mouth daily. 06/05/16  Yes [name], Marne A, MD  latanoprost (XALATAN) 0.005 % ophthalmic solution Place 1 drop into both eyes at bedtime. Use eye drops as directed.    Yes [provider]  metFORMIN (GLUCOPHAGE) 500 MG tablet Take 1 tablet (500 mg total) by mouth daily. 06/05/16  Yes [name], [name], MD  Multiple Vitamin (MULTIVITAMIN) tablet Take 1 tablet by mouth daily.     Yes [provider]  timolol (TIMOPTIC) 0.5 % ophthalmic solution USE 1 DROP IN BOTH EYES IN THE MORNING 07/22/14  Yes [provider]  ACCU-CHEK AVIVA PLUS test strip TEST BLOOD SUGAR ONCE DAILY AND AS NEEDED (DX. E11.9) 07/02/16   [name], [name], MD  ACCU-CHEK SOFTCLIX LANCETS lancets USE DEVICE TO TEST BLOOD SUGAR ONCE DAILY. (DX E11.9) 02/25/17   [name], [name], MD  tiZANidine (ZANAFLEX) 4 MG tablet TAKE 1 TABLET BY MOUTH EVERY 8 HOURS AS NEEDED NO MORE THAN 3 DOSES IN 24 HOURS 01/13/17   [provider]   Physical Exam: Blood pressure (!) 187/91, pulse 86, temperature 97.7 F (36.5 C), temperature source Oral, resp. rate 13, SpO2 95 %  1. General:  in No Acute distress  well -appearing 2. Psychological: Alert and  Oriented 3. Head/ENT:     Dry Mucous Membranes                          Head Non traumatic, neck supple                           Poor Dentition 4. SKIN:  decreased Skin turgor,  Skin clean Dry and intact no rash 5. Heart: Regular rate and rhythm no  Murmur, no Rub or gallop 6. Lungs:  Clear to auscultation bilaterally, no wheezes or crackles   7. Abdomen: Soft, non-tender, Non distended bowel sounds present 8. Lower extremities: no clubbing, cyanosis, or edema 9.  Neurologically Grossly intact, moving all 4 extremities equally  10. MSK: Normal range of motion, limited in right leg.   Patient Vitals for the past 24 hrs:  BP Temp Temp src Pulse Resp SpO2  03/28/17 1745 (!) 187/91 - - 86 13 95 %  03/28/17 1527 (!) 184/137 97.7 F (36.5 C) Oral 80 18 92 %  03/28/17 1443 - - - - - 96 %  BMIP Recent Labs  Lab 03/28/17 1619  WBC 18.5*  NEUTROABS 16.7*  HGB 14.2  HCT 43.1  MCV 90.7  PLT 474   Basic Metabolic Panel: Recent Labs  Lab 03/28/17 1619  NA 136  K 3.5  CL 97*  CO2 27  GLUCOSE 154*  BUN 11  CREATININE 0.79  CALCIUM 9.5  LABRCNTIP(wbc:5,neutroabs:5,hgb:5,hct:5,mcv:5,plt:5) Recent Labs  Lab 03/28/17 1619  NA 136  K 3.5  CL 97*  CO2 27  GLUCOSE 154*  BUN 11  CREATININE 0.79  CALCIUM 9.5  LABRCNTIP(ast:5,ALT:5,alkphos:5,bilitot:5,prot:5,albumin:5)No results for input(s): LIPASE, AMYLASE in the last 168 hours. No results for input(s): AMMONIA in the last 168 hours.    HbA1C: No results for input(s): HGBA1C in the last 72 hours. CBG: No results for input(s): GLUCAP in the last 168 hours.    Urine analysis: No results found for: COLORURINE, APPEARANCEUR, LABSPEC, PHURINE, GLUCOSEU, HGBUR, BILIRUBINUR, KETONESUR, PROTEINUR, UROBILINOGEN, NITRITE, LEUKOCYTESUR     Cultures: No results found for: [place], [place], [place], REPTSTATUS   Radiological Exams on Admission: Dg Lumbar Spine  Complete  Result Date: 03/28/2017 CLINICAL DATA:  Fall with pelvic pain EXAM: LUMBAR SPINE - COMPLETE 4+ VIEW COMPARISON:  MRI 09/16/2016 FINDINGS: Five non rib-bearing lumbar type vertebra. Grade 1 anterolisthesis of L5 on S1. Moderate degenerative changes at L5-S1. Vertebral body heights are maintained. Mild degenerative changes at L1-L2 and L2-L3. Aortic atherosclerosis. IMPRESSION: Grade 1 anterolisthesis of L5 on S1, similar compared to 2018 MRI. No definite acute osseous abnormality. Electronically Signed   By: Donavan Foil M.D.   On:  03/28/2017 17:37   Dg Elbow Complete Right  Result Date: 03/28/2017 CLINICAL DATA:  Fall with elbow pain EXAM: RIGHT ELBOW - COMPLETE 3+ VIEW COMPARISON:  None. FINDINGS: There is no evidence of fracture, dislocation, or joint effusion. There is no evidence of arthropathy or other focal bone abnormality. Soft tissues are unremarkable. IMPRESSION: Negative. Electronically Signed   By: Donavan Foil M.D.   On: 03/28/2017 17:40   Dg Ankle Complete Right  Result Date: 03/28/2017 CLINICAL DATA:  Fall with pain EXAM: RIGHT ANKLE - COMPLETE 3+ VIEW COMPARISON:  None. FINDINGS: No acute displaced fracture or malalignment is seen. Ankle mortise is symmetric. Mild degenerative changes. IMPRESSION: No acute osseous abnormality Electronically Signed   By: Donavan Foil M.D.   On: 03/28/2017 17:40   Dg Knee Complete 4 Views Right  Result Date: 03/28/2017 CLINICAL DATA:  Fall EXAM: RIGHT KNEE - COMPLETE 4+ VIEW COMPARISON:  07/01/2011 FINDINGS: No acute displaced fracture or malalignment. Mild patellofemoral degenerative changes with spurring. Trace knee effusion. Mild to moderate degenerative changes at the lateral compartment. Mild degenerative changes at the medial compartment. IMPRESSION: Degenerative changes.  No acute osseous abnormality. Electronically Signed   By: Donavan Foil M.D.   On: 03/28/2017 17:39   Dg Hip Unilat With Pelvis 2-3 Views Right  Result Date: 03/28/2017 CLINICAL DATA:  Fall right hip pain EXAM: DG HIP (WITH OR WITHOUT PELVIS) 2-3V RIGHT COMPARISON:  09/06/2016 FINDINGS: Pubic symphysis and rami are intact. Right femoral head projects in joint. Acute mildly displaced intertrochanteric fracture. IMPRESSION: Acute mildly displaced right intertrochanteric fracture. Electronically Signed   By: Donavan Foil M.D.   On: 03/28/2017 17:33    Chart has been reviewed    Assessment/Plan  77 y.o. female with medical history significant of HTN, DM2, tobacco abuse, HLD, osteopenia  Admitted  for right hip fracture  Present on Admission: . Closed intertrochanteric fracture of right hip ([place]) -  - management as per orthopedics,  plan to operate   in  a.m.   Keep nothing by mouth post midnight. Patient   not on anticoagulation or antiplatelet agents  Ordered type and screen, Place Foley, order a vitamin D level  Patient at baseline active with no exertional chest pain     Patient denies any chest pain or shortness of breath currently and/or with exertion,   ECG showing no evidence of acute ischemia  no known history of coronary artery disease, COPD  Liver failure  CKD  Given advanced age patient is at least moderate  risk   which has been discussed with family but at this point no furthther cardiac workup is indicated.     . Leukocytosis -likely stress related secondary to hip fracture but will evaluate for any source of infection . Hyperlipidemia stable resume home medications when able to tolerate . Essential hypertension  -stable resume atenolol when able to DM2-  - Order Sensitive SSI     -  check TSH and HgA1C  - Hold  by mouth medications      Other plan as per orders.  DVT prophylaxis:  SCD    Code Status:  FULL CODE as per patient    Family Communication:   Family   at  Bedside  plan of care was discussed with   Son,   Disposition Plan:    likely will need placement for rehabilitation                                               Would benefit from PT/OT eval prior to [place] called: Orthopedics  Admission status:  inpatient       Level of care    medical floor            I have spent a total of 56 min on this admission  Makenzie Weisner 03/28/2017, 9:28 PM    [facility]  Pager [phone]   after 2 AM please page floor coverage PA If 7AM-7PM, please contact the day team taking care of the patient  [email or web address]  Password [email or web address]

## 2017-03-28 NOTE — ED Provider Notes (Signed)
[facility] DEPT Provider Note   CSN: [id number] Arrival date & time: 03/28/17  1422     History   Chief Complaint Chief Complaint  Patient presents with  . Fall  . Hip Pain    RIGHT HIP    HPI Candace Newman is a 77 y.o. female.  HPI   Patient is a 77 year old female with a history of type 2 diabetes mellitus, hyperlipidemia, hypertension, osteopenia, and glaucoma presenting for right hip pain after a fall today.  Patient reports that she was at her eye physician's office and leaving the office, when she tripped over a rug coming out of a door frame.  Patient fell down onto her right elbow, right hip and right leg.  Patient denies loss of consciousness, head injury or neck trauma.  Patient does not take blood thinning medications.  Patient reports that she has exquisite pain in her right groin at this time and cannot fully extend her leg due to the pain.  Patient denies loss of sensation distal to the injury.  Patient also reporting she is small open wound on her right elbow.  Patient denies chest pain, shortness breath.  Patient does report that she has nausea, but has not vomited yet.  Past Medical History:  Diagnosis Date  . Cataract    REMOVED BILATERAL  . Diabetes mellitus    type II  . Glaucoma    BILATERAL  . Hyperlipidemia   . Hypertension   . Osteopenia     Patient Active Problem List   Diagnosis Date Noted  . Weakness of right leg 09/06/2016  . Low back pain 09/06/2016  . Right hip pain 09/06/2016  . History of colonic polyps 11/27/2015  . Routine general medical examination at a health care facility 05/30/2015  . Estrogen deficiency 05/30/2015  . Encounter for Medicare annual wellness exam 01/19/2013  . Personal history of colonic polyps 01/19/2013  . Osteopenia 07/29/2011  . Hyponatremia 02/19/2011  . POSTMENOPAUSAL STATUS 04/14/2007  . Diabetes type 2, controlled ([place]) 03/12/2007  . Hyperlipidemia 03/12/2007  .  GLAUCOMA 03/12/2007  . Essential hypertension 03/12/2007    Past Surgical History:  Procedure Laterality Date  . ABDOMINAL HYSTERECTOMY     ovaries intact -non cancer done for bleeding     OB History   None      Home Medications    Prior to Admission medications   Medication Sig Start Date End Date Taking? Authorizing Provider  atenolol (TENORMIN) 25 MG tablet Take 1 tablet (25 mg total) by mouth daily. 12/27/16  Yes [name], [name], MD  atorvastatin (LIPITOR) 20 MG tablet Take 1 tablet (20 mg total) by mouth daily. 06/05/16  Yes [name], Marne A, MD  latanoprost (XALATAN) 0.005 % ophthalmic solution Place 1 drop into both eyes at bedtime. Use eye drops as directed.    Yes [provider]  metFORMIN (GLUCOPHAGE) 500 MG tablet Take 1 tablet (500 mg total) by mouth daily. 06/05/16  Yes [name], [name], MD  Multiple Vitamin (MULTIVITAMIN) tablet Take 1 tablet by mouth daily.     Yes [provider]  timolol (TIMOPTIC) 0.5 % ophthalmic solution USE 1 DROP IN BOTH EYES IN THE MORNING 07/22/14  Yes [provider]  ACCU-CHEK AVIVA PLUS test strip TEST BLOOD SUGAR ONCE DAILY AND AS NEEDED (DX. E11.9) 07/02/16   [name], [name], MD  ACCU-CHEK SOFTCLIX LANCETS lancets USE DEVICE TO TEST BLOOD SUGAR ONCE DAILY. (DX E11.9) 02/25/17   [name],  Marne A, MD  tiZANidine (ZANAFLEX) 4 MG tablet TAKE 1 TABLET BY MOUTH EVERY 8 HOURS AS NEEDED NO MORE THAN 3 DOSES IN 24 HOURS 01/13/17   [provider]    Family History Family History  Problem Relation Age of Onset  . Heart disease Mother        CAD  . Hypertension Mother   . Heart disease Father        CAD  . Diabetes Brother   . Colon cancer Brother   . Cancer Brother        lymph node CA    Social History Social History   Tobacco Use  . Smoking status: Former Smoker    Types: Cigarettes    Last attempt to quit: 01/07/2005    Years since quitting: 12.2  . Smokeless tobacco: Never Used  Substance Use Topics  .  Alcohol use: No    Alcohol/week: 0.0 oz  . Drug use: No     Allergies   Patient has no known allergies.   Review of Systems Review of Systems  Respiratory: Negative for shortness of breath.   Cardiovascular: Negative for chest pain.  Gastrointestinal: Positive for nausea. Negative for abdominal pain and vomiting.  Musculoskeletal: Positive for arthralgias, back pain, joint swelling and myalgias. Negative for neck pain and neck stiffness.  Skin: Positive for wound.  Neurological: Negative for syncope, light-headedness and headaches.  All other systems reviewed and are negative.    Physical Exam Updated Vital Signs BP (!) 184/137 (BP Location: Left Arm)   Pulse 80   Temp 97.7 F (36.5 C) (Oral)   Resp 18   SpO2 92%   Physical Exam  Constitutional: She appears well-developed and well-nourished. No distress.  HENT:  Head: Normocephalic and atraumatic.  Mouth/Throat: Oropharynx is clear and moist.  Eyes: Pupils are equal, round, and reactive to light. Conjunctivae and EOM are normal.  Neck: Normal range of motion. Neck supple.  Cardiovascular: Normal rate, regular rhythm, S1 normal and S2 normal.  No murmur heard. Intact, 2+ DP and PT pulse of the right lower extremity  Pulmonary/Chest: Effort normal and breath sounds normal. She has no wheezes. She has no rales.  Abdominal: Soft. She exhibits no distension. There is no tenderness. There is no guarding.  Musculoskeletal:  No tenderness to palpation of the midline cervical, thoracic, or lumbar spine.  No lumbar paraspinal muscular tenderness.  No tenderness to palpation of bilateral shoulders.  There is mild tenderness to palpation of the superior right elbow.  No distal radius or ulnar tenderness bilaterally, or tenderness to palpation of bilateral metacarpals or phalanges.  LOWER EXTREMITY EXAM: Right  INSPECTION & PALPATION: No gross deformity.  No swelling.  No open wounds.  Tenderness to palpation of the anterior  right hip.  No trochanteric tenderness, or posterior hip tenderness.  SENSORY: sensation is intact to light touch in:  Superficial peroneal nerve distribution (over dorsum of foot) Deep peroneal nerve distribution (over first dorsal web space) Sural nerve distribution (over lateral aspect 5th metatarsal) Saphenous nerve distribution (over medial instep)  VASCULAR: 2+ dorsalis pedis and posterior tibialis pulses Capillary refill < 2 sec, toes warm and well-perfused  COMPARTMENTS: Soft, warm, well-perfused No pain with passive extension No parethesias   Lymphadenopathy:    She has no cervical adenopathy.  Neurological: She is alert.  Cranial nerves grossly intact. Patient moves extremities symmetrically and with good coordination.  Skin: Skin is warm and dry. No rash noted. No erythema.  Psychiatric: She has a normal mood and affect. Her behavior is normal. Judgment and thought content normal.  Nursing note and vitals reviewed.    ED Treatments / Results  Labs (all labs ordered are listed, but only abnormal results are displayed) Labs Reviewed  BASIC METABOLIC PANEL  CBC WITH DIFFERENTIAL/PLATELET  PROTIME-INR  URINALYSIS, ROUTINE W REFLEX MICROSCOPIC    EKG  Radiology No results found.  Procedures Procedures (including critical care time)  Medications Ordered in ED Medications  morphine 4 MG/ML injection 4 mg (has no administration in time range)  Tdap (BOOSTRIX) injection 0.5 mL (has no administration in time range)     Initial Impression / Assessment and Plan / ED Course  I have reviewed the triage vital signs and the nursing notes.  Pertinent labs & imaging results that were available during my care of the patient were reviewed by me and considered in my medical decision making (see chart for details).     Patient nontoxic-appearing, but obvious deformity noted to the right lower extremity.  Right lower extremity is neurovascularly intact.  Concern for  hip fracture.  Will obtain radiography.  Analgesia given at this time.  Will obtain preoperative labs this preparation.  Wound care initiated for mild skin tear of right elbow.  Tdap updated.  X-ray of right hip demonstrating intertrochanteric fracture that is mildly displaced.  Case signed out to Joline Maxcy, PA-C to contact orthopedics and admit the patient.  Preoperative workup initiated.  Final Clinical Impressions(s) / ED Diagnoses   Final diagnoses:  Closed intertrochanteric fracture of hip, right, initial encounter [facility])    ED Discharge Orders    None       Tamala Julian 03/28/17 1748    Davonna Belling, MD 03/31/17 2144

## 2017-03-28 NOTE — Progress Notes (Addendum)
Patient with displaced intertrochanteric femur fracture, recommend surgical management to aid in early mobilization. Discussed with ER admission to [facility] is ideal to facilitate timely surgery with an experienced team     Posted case for OR for 7:30 at [facility].  Recommended patient admitted to hospitalist per hip fracture protocol. plan for IMN tomorrow as a start case if medically appropriate for surgery.  Will meet patient in preop holding area and discuss specifics tomorrow morning.

## 2017-03-29 ENCOUNTER — Encounter (HOSPITAL_COMMUNITY): Payer: Self-pay | Admitting: *Deleted

## 2017-03-29 ENCOUNTER — Inpatient Hospital Stay (HOSPITAL_COMMUNITY): Payer: Medicare Other | Admitting: Anesthesiology

## 2017-03-29 ENCOUNTER — Encounter (HOSPITAL_COMMUNITY)
Admission: EM | Disposition: A | Discharge status: Skilled Nursing Facility | Payer: Self-pay | Source: Home / Self Care | Attending: Internal Medicine

## 2017-03-29 ENCOUNTER — Inpatient Hospital Stay (HOSPITAL_COMMUNITY): Payer: Medicare Other

## 2017-03-29 ENCOUNTER — Other Ambulatory Visit: Payer: Self-pay

## 2017-03-29 DIAGNOSIS — E7800 Pure hypercholesterolemia, unspecified: Secondary | ICD-10-CM

## 2017-03-29 HISTORY — PX: INTRAMEDULLARY (IM) NAIL INTERTROCHANTERIC: SHX5875

## 2017-03-29 LAB — BASIC METABOLIC PANEL
Anion gap: 9 (ref 5–15)
BUN: 13 mg/dL (ref 6–20)
CO2: 28 mmol/L (ref 22–32)
Calcium: 9.3 mg/dL (ref 8.9–10.3)
Chloride: 97 mmol/L — ABNORMAL LOW (ref 101–111)
Creatinine, Ser: 0.88 mg/dL (ref 0.44–1.00)
GFR calc Af Amer: >60 mL/min (ref >60)
Glucose, Bld: 145 mg/dL — ABNORMAL HIGH (ref 65–99)
POTASSIUM: 4.1 mmol/L (ref 3.5–5.1)
SODIUM: 134 mmol/L — AB (ref 135–145)

## 2017-03-29 LAB — SURGICAL PCR SCREEN
MRSA, PCR: NEGATIVE
Staphylococcus aureus: NEGATIVE

## 2017-03-29 LAB — GLUCOSE, CAPILLARY
GLUCOSE-CAPILLARY: 154 mg/dL — AB (ref 65–99)
GLUCOSE-CAPILLARY: 173 mg/dL — AB (ref 65–99)
GLUCOSE-CAPILLARY: 283 mg/dL — AB (ref 65–99)
Glucose-Capillary: 124 mg/dL — ABNORMAL HIGH (ref 65–99)
Glucose-Capillary: 150 mg/dL — ABNORMAL HIGH (ref 65–99)
Glucose-Capillary: 191 mg/dL — ABNORMAL HIGH (ref 65–99)
Glucose-Capillary: 210 mg/dL — ABNORMAL HIGH (ref 65–99)
Glucose-Capillary: 238 mg/dL — ABNORMAL HIGH (ref 65–99)

## 2017-03-29 LAB — CBC
HEMATOCRIT: 37.5 % (ref 36.0–46.0)
HEMOGLOBIN: 12.5 g/dL (ref 12.0–15.0)
MCH: 30.2 pg (ref 26.0–34.0)
MCHC: 33.3 g/dL (ref 30.0–36.0)
MCV: 90.6 fL (ref 78.0–100.0)
Platelets: 206 10*3/uL (ref 150–400)
RBC: 4.14 MIL/uL (ref 3.87–5.11)
RDW: 12.6 % (ref 11.5–15.5)
WBC: 18.3 10*3/uL — AB (ref 4.0–10.5)

## 2017-03-29 LAB — HEMOGLOBIN A1C
Hgb A1c MFr Bld: 6 % — ABNORMAL HIGH (ref 4.8–5.6)
Mean Plasma Glucose: 125.5 mg/dL

## 2017-03-29 LAB — ALBUMIN: ALBUMIN: 3.9 g/dL (ref 3.5–5.0)

## 2017-03-29 SURGERY — FIXATION, FRACTURE, INTERTROCHANTERIC, WITH INTRAMEDULLARY ROD
Anesthesia: General | Laterality: Right

## 2017-03-29 MED ORDER — ARTIFICIAL TEARS OPHTHALMIC OINT
TOPICAL_OINTMENT | OPHTHALMIC | Status: AC
  Filled 2017-03-29: qty 3.5

## 2017-03-29 MED ORDER — HYDROMORPHONE HCL 1 MG/ML IJ SOLN
0.2500 mg | INTRAMUSCULAR | Status: DC | PRN
  Administered 2017-03-29 (×2): 0.5 mg via INTRAVENOUS

## 2017-03-29 MED ORDER — CEFAZOLIN SODIUM-DEXTROSE 2-4 GM/100ML-% IV SOLN
2.0000 g | INTRAVENOUS | Status: AC
  Administered 2017-03-29: 2 g via INTRAVENOUS

## 2017-03-29 MED ORDER — LACTATED RINGERS IV SOLN
INTRAVENOUS | Status: DC | PRN
  Administered 2017-03-29: 07:00:00 via INTRAVENOUS

## 2017-03-29 MED ORDER — CEFAZOLIN SODIUM-DEXTROSE 2-4 GM/100ML-% IV SOLN
INTRAVENOUS | Status: AC
  Filled 2017-03-29: qty 100

## 2017-03-29 MED ORDER — MENTHOL 3 MG MT LOZG: 1.0000 | LOZENGE | OROMUCOSAL | Status: DC | PRN

## 2017-03-29 MED ORDER — INSULIN ASPART 100 UNIT/ML ~~LOC~~ SOLN
0.0000 [IU] | Freq: Three times a day (TID) | SUBCUTANEOUS | Status: DC
  Administered 2017-03-29: 5 [IU] via SUBCUTANEOUS
  Administered 2017-03-30: 1 [IU] via SUBCUTANEOUS
  Administered 2017-03-30: 2 [IU] via SUBCUTANEOUS
  Administered 2017-03-30: 3 [IU] via SUBCUTANEOUS
  Administered 2017-03-31: 1 [IU] via SUBCUTANEOUS
  Administered 2017-03-31: 2 [IU] via SUBCUTANEOUS
  Administered 2017-03-31: 3 [IU] via SUBCUTANEOUS
  Administered 2017-04-01 (×2): 2 [IU] via SUBCUTANEOUS
  Administered 2017-04-01: 3 [IU] via SUBCUTANEOUS
  Administered 2017-04-02: 2 [IU] via SUBCUTANEOUS
  Administered 2017-04-02: 1 [IU] via SUBCUTANEOUS
  Administered 2017-04-03: 2 [IU] via SUBCUTANEOUS
  Administered 2017-04-03 (×2): 1 [IU] via SUBCUTANEOUS
  Administered 2017-04-04 (×2): 2 [IU] via SUBCUTANEOUS

## 2017-03-29 MED ORDER — PHENYLEPHRINE 40 MCG/ML (10ML) SYRINGE FOR IV PUSH (FOR BLOOD PRESSURE SUPPORT)
PREFILLED_SYRINGE | INTRAVENOUS | Status: DC | PRN
  Administered 2017-03-29: 80 ug via INTRAVENOUS
  Administered 2017-03-29: 120 ug via INTRAVENOUS

## 2017-03-29 MED ORDER — DOCUSATE SODIUM 100 MG PO CAPS
100.0000 mg | ORAL_CAPSULE | Freq: Two times a day (BID) | ORAL | Status: DC
  Administered 2017-03-29 – 2017-04-04 (×13): 100 mg via ORAL
  Filled 2017-03-29 (×13): qty 1

## 2017-03-29 MED ORDER — ONDANSETRON HCL 4 MG/2ML IJ SOLN: 4.0000 mg | Freq: Once | INTRAMUSCULAR | Status: DC | PRN

## 2017-03-29 MED ORDER — HYDROMORPHONE HCL 1 MG/ML IJ SOLN
INTRAMUSCULAR | Status: AC
  Administered 2017-03-29: 0.5 mg via INTRAVENOUS
  Filled 2017-03-29: qty 1

## 2017-03-29 MED ORDER — CEFAZOLIN SODIUM-DEXTROSE 2-4 GM/100ML-% IV SOLN
2.0000 g | Freq: Four times a day (QID) | INTRAVENOUS | Status: AC
  Administered 2017-03-29 (×2): 2 g via INTRAVENOUS
  Filled 2017-03-29 (×2): qty 100

## 2017-03-29 MED ORDER — FENTANYL CITRATE (PF) 250 MCG/5ML IJ SOLN
INTRAMUSCULAR | Status: AC
  Filled 2017-03-29: qty 5

## 2017-03-29 MED ORDER — LIDOCAINE HCL (CARDIAC) 20 MG/ML IV SOLN
INTRAVENOUS | Status: AC
  Filled 2017-03-29: qty 5

## 2017-03-29 MED ORDER — CEFAZOLIN SODIUM-DEXTROSE 2-4 GM/100ML-% IV SOLN: 2.0000 g | INTRAVENOUS | Status: AC

## 2017-03-29 MED ORDER — ONDANSETRON HCL 4 MG/2ML IJ SOLN
INTRAMUSCULAR | Status: AC
  Filled 2017-03-29: qty 2

## 2017-03-29 MED ORDER — PROPOFOL 10 MG/ML IV BOLUS
INTRAVENOUS | Status: DC | PRN
  Administered 2017-03-29: 120 mg via INTRAVENOUS

## 2017-03-29 MED ORDER — SUGAMMADEX SODIUM 200 MG/2ML IV SOLN
INTRAVENOUS | Status: DC | PRN
  Administered 2017-03-29: 200 mg via INTRAVENOUS

## 2017-03-29 MED ORDER — ONDANSETRON HCL 4 MG/2ML IJ SOLN
INTRAMUSCULAR | Status: DC | PRN
  Administered 2017-03-29: 4 mg via INTRAVENOUS

## 2017-03-29 MED ORDER — CELECOXIB 200 MG PO CAPS
200.0000 mg | ORAL_CAPSULE | Freq: Two times a day (BID) | ORAL | Status: DC
  Administered 2017-03-29 – 2017-03-30 (×3): 200 mg via ORAL
  Filled 2017-03-29 (×3): qty 1

## 2017-03-29 MED ORDER — PHENOL 1.4 % MT LIQD: 1.0000 | OROMUCOSAL | Status: DC | PRN

## 2017-03-29 MED ORDER — PHENYLEPHRINE HCL 10 MG/ML IJ SOLN
INTRAVENOUS | Status: DC | PRN
  Administered 2017-03-29: 50 ug/min via INTRAVENOUS

## 2017-03-29 MED ORDER — DEXAMETHASONE SODIUM PHOSPHATE 10 MG/ML IJ SOLN
INTRAMUSCULAR | Status: DC | PRN
  Administered 2017-03-29: 10 mg via INTRAVENOUS

## 2017-03-29 MED ORDER — METOCLOPRAMIDE HCL 5 MG/ML IJ SOLN: 5.0000 mg | Freq: Three times a day (TID) | INTRAMUSCULAR | Status: DC | PRN

## 2017-03-29 MED ORDER — MORPHINE SULFATE (PF) 2 MG/ML IV SOLN: 0.5000 mg | INTRAVENOUS | Status: DC | PRN

## 2017-03-29 MED ORDER — DEXAMETHASONE SODIUM PHOSPHATE 10 MG/ML IJ SOLN
INTRAMUSCULAR | Status: AC
Start: 2017-03-29 — End: ?
  Filled 2017-03-29: qty 1

## 2017-03-29 MED ORDER — METOCLOPRAMIDE HCL 5 MG PO TABS: 5.0000 mg | ORAL_TABLET | Freq: Three times a day (TID) | ORAL | Status: DC | PRN

## 2017-03-29 MED ORDER — SUGAMMADEX SODIUM 200 MG/2ML IV SOLN
INTRAVENOUS | Status: AC
  Filled 2017-03-29: qty 2

## 2017-03-29 MED ORDER — LIDOCAINE HCL (CARDIAC) 20 MG/ML IV SOLN
INTRAVENOUS | Status: DC | PRN
  Administered 2017-03-29: 100 mg via INTRAVENOUS

## 2017-03-29 MED ORDER — FENTANYL CITRATE (PF) 100 MCG/2ML IJ SOLN
INTRAMUSCULAR | Status: DC | PRN
  Administered 2017-03-29: 100 ug via INTRAVENOUS
  Administered 2017-03-29 (×2): 50 ug via INTRAVENOUS

## 2017-03-29 MED ORDER — MEPERIDINE HCL 50 MG/ML IJ SOLN: 6.2500 mg | INTRAMUSCULAR | Status: DC | PRN

## 2017-03-29 MED ORDER — PHENYLEPHRINE HCL 10 MG/ML IJ SOLN: INTRAMUSCULAR | Status: DC | PRN

## 2017-03-29 MED ORDER — ONDANSETRON HCL 4 MG/2ML IJ SOLN
4.0000 mg | Freq: Four times a day (QID) | INTRAMUSCULAR | Status: DC | PRN
  Administered 2017-03-31: 4 mg via INTRAVENOUS
  Filled 2017-03-29 (×3): qty 2

## 2017-03-29 MED ORDER — ENOXAPARIN SODIUM 40 MG/0.4ML ~~LOC~~ SOLN
40.0000 mg | SUBCUTANEOUS | Status: DC
  Administered 2017-03-30 – 2017-04-04 (×6): 40 mg via SUBCUTANEOUS
  Filled 2017-03-29 (×6): qty 0.4

## 2017-03-29 MED ORDER — PROPOFOL 10 MG/ML IV BOLUS
INTRAVENOUS | Status: AC
  Filled 2017-03-29: qty 20

## 2017-03-29 MED ORDER — METHOCARBAMOL 500 MG PO TABS
ORAL_TABLET | ORAL | Status: AC
Start: 2017-03-29 — End: 2017-03-29
  Filled 2017-03-29: qty 1

## 2017-03-29 MED ORDER — ACETAMINOPHEN 500 MG PO TABS
1000.0000 mg | ORAL_TABLET | Freq: Four times a day (QID) | ORAL | Status: AC
  Administered 2017-03-29 – 2017-04-02 (×15): 1000 mg via ORAL
  Filled 2017-03-29 (×15): qty 2

## 2017-03-29 MED ORDER — ONDANSETRON HCL 4 MG PO TABS
4.0000 mg | ORAL_TABLET | Freq: Four times a day (QID) | ORAL | Status: DC | PRN
  Filled 2017-03-29: qty 1

## 2017-03-29 MED ORDER — NICOTINE 7 MG/24HR TD PT24
7.0000 mg | MEDICATED_PATCH | Freq: Every day | TRANSDERMAL | Status: DC
  Administered 2017-03-30 – 2017-04-04 (×6): 7 mg via TRANSDERMAL
  Filled 2017-03-29 (×6): qty 1

## 2017-03-29 MED ORDER — OXYCODONE HCL 5 MG PO TABS
5.0000 mg | ORAL_TABLET | ORAL | Status: DC | PRN
  Administered 2017-03-30 – 2017-03-31 (×2): 10 mg via ORAL
  Filled 2017-03-29 (×2): qty 2

## 2017-03-29 MED ORDER — ROCURONIUM BROMIDE 100 MG/10ML IV SOLN
INTRAVENOUS | Status: DC | PRN
  Administered 2017-03-29: 50 mg via INTRAVENOUS

## 2017-03-29 MED ORDER — 0.9 % SODIUM CHLORIDE (POUR BTL) OPTIME
TOPICAL | Status: DC | PRN
  Administered 2017-03-29: 1000 mL

## 2017-03-29 MED ORDER — HYDRALAZINE HCL 20 MG/ML IJ SOLN
10.0000 mg | Freq: Four times a day (QID) | INTRAMUSCULAR | Status: DC | PRN
  Administered 2017-03-30 – 2017-04-01 (×3): 10 mg via INTRAVENOUS
  Filled 2017-03-29 (×4): qty 1

## 2017-03-29 SURGICAL SUPPLY — 42 items
ADH SKN CLS APL DERMABOND .7 (GAUZE/BANDAGES/DRESSINGS)
APL SKNCLS STERI-STRIP NONHPOA (GAUZE/BANDAGES/DRESSINGS) ×1
BENZOIN TINCTURE PRP APPL 2/3 (GAUZE/BANDAGES/DRESSINGS) ×2 IMPLANT
BIT DRILL AO GAMMA 4.2X180 (BIT) ×2 IMPLANT
BNDG COHESIVE 4X5 TAN STRL (GAUZE/BANDAGES/DRESSINGS) ×3 IMPLANT
BNDG GAUZE ELAST 4 BULKY (GAUZE/BANDAGES/DRESSINGS) ×2 IMPLANT
CLOSURE STERI-STRIP 1/2X4 (GAUZE/BANDAGES/DRESSINGS) ×1
CLSR STERI-STRIP ANTIMIC 1/2X4 (GAUZE/BANDAGES/DRESSINGS) ×2 IMPLANT
COVER PERINEAL POST (MISCELLANEOUS) ×3 IMPLANT
COVER SURGICAL LIGHT HANDLE (MISCELLANEOUS) ×3 IMPLANT
DERMABOND ADVANCED (GAUZE/BANDAGES/DRESSINGS)
DERMABOND ADVANCED .7 DNX12 (GAUZE/BANDAGES/DRESSINGS) IMPLANT
DRAPE STERI IOBAN 125X83 (DRAPES) ×3 IMPLANT
DRSG MEPILEX BORDER 4X4 (GAUZE/BANDAGES/DRESSINGS) ×9 IMPLANT
DURAPREP 26ML APPLICATOR (WOUND CARE) ×3 IMPLANT
ELECT REM PT RETURN 9FT ADLT (ELECTROSURGICAL) ×3
ELECTRODE REM PT RTRN 9FT ADLT (ELECTROSURGICAL) ×1 IMPLANT
GLOVE BIOGEL PI IND STRL 8 (GLOVE) ×1 IMPLANT
GLOVE BIOGEL PI INDICATOR 8 (GLOVE) ×2
GLOVE ECLIPSE 8.0 STRL XLNG CF (GLOVE) ×8 IMPLANT
GOWN STRL REUS W/ TWL LRG LVL3 (GOWN DISPOSABLE) ×2 IMPLANT
GOWN STRL REUS W/TWL LRG LVL3 (GOWN DISPOSABLE) ×3
GUIDEROD T2 3X1000 (ROD) ×2 IMPLANT
K-WIRE  3.2X450M STR (WIRE) ×4
K-WIRE 3.2X450M STR (WIRE) ×2
KIT ROOM TURNOVER OR (KITS) ×3 IMPLANT
KWIRE 3.2X450M STR (WIRE) IMPLANT
NAIL GAMMA LG R 5TI 10X360X125 (Nail) ×2 IMPLANT
NS IRRIG 1000ML POUR BTL (IV SOLUTION) ×3 IMPLANT
PACK GENERAL/GYN (CUSTOM PROCEDURE TRAY) ×3 IMPLANT
PAD ARMBOARD 7.5X6 YLW CONV (MISCELLANEOUS) ×5 IMPLANT
REAMER SHAFT BIXCUT (INSTRUMENTS) ×2 IMPLANT
SCREW LAG GAMMA 3 TI 10.5X85MM (Screw) ×2 IMPLANT
SCREW LOCKING T2 F/T  5X37.5MM (Screw) ×2 IMPLANT
SCREW LOCKING T2 F/T 5X37.5MM (Screw) IMPLANT
SUT MNCRL AB 4-0 PS2 18 (SUTURE) ×5 IMPLANT
SUT VIC AB 0 CT1 27 (SUTURE) ×6
SUT VIC AB 0 CT1 27XBRD ANBCTR (SUTURE) ×1 IMPLANT
SUT VIC AB 2-0 CT1 27 (SUTURE) ×6
SUT VIC AB 2-0 CT1 TAPERPNT 27 (SUTURE) ×1 IMPLANT
TOWEL OR 17X24 6PK STRL BLUE (TOWEL DISPOSABLE) ×3 IMPLANT
TOWEL OR 17X26 10 PK STRL BLUE (TOWEL DISPOSABLE) ×3 IMPLANT

## 2017-03-29 NOTE — Consult Note (Signed)
ORTHOPAEDIC CONSULTATION  REQUESTING PHYSICIAN: Debbe Odea, MD  Chief Complaint: R hip pain  HPI: Candace Newman is a 77 y.o. female with  acute traumatic fall from standing resulting in R hip pain and inability to ambulate.  No LOC, at baseline state of health prior to fall.  Ambulates without device and lives with husband prior to surgery.  Reports significant pain at site of injury, no other areas of pain reported acutely.    Past Medical History:  Diagnosis Date  . Cataract    REMOVED BILATERAL  . Diabetes mellitus    type II  . Glaucoma    BILATERAL  . Hyperlipidemia   . Hypertension   . Osteopenia    Past Surgical History:  Procedure Laterality Date  . ABDOMINAL HYSTERECTOMY     ovaries intact -non cancer done for bleeding   Social History   Socioeconomic History  . Marital status: Married    Spouse name: Not on file  . Number of children: Not on file  . Years of education: Not on file  . Highest education level: Not on file  Occupational History  . Not on file  Social Needs  . Financial resource strain: Not on file  . Food insecurity:    Worry: Not on file    Inability: Not on file  . Transportation needs:    Medical: Not on file    Non-medical: Not on file  Tobacco Use  . Smoking status: Former Smoker    Types: Cigarettes    Last attempt to quit: 01/07/2005    Years since quitting: 12.2  . Smokeless tobacco: Never Used  Substance and Sexual Activity  . Alcohol use: No    Alcohol/week: 0.0 oz  . Drug use: No  . Sexual activity: Never  Lifestyle  . Physical activity:    Days per week: Not on file    Minutes per session: Not on file  . Stress: Not on file  Relationships  . Social connections:    Talks on phone: Not on file    Gets together: Not on file    Attends religious service: Not on file    Active member of club or organization: Not on file    Attends meetings of clubs or organizations: Not on file    Relationship status: Not  on file  Other Topics Concern  . Not on file  Social History Narrative  . Not on file   Family History  Problem Relation Age of Onset  . Heart disease Mother        CAD  . Hypertension Mother   . Heart disease Father        CAD  . Diabetes Brother   . Colon cancer Brother   . Cancer Brother        lymph node CA   No Known Allergies Prior to Admission medications   Medication Sig Start Date End Date Taking? Authorizing Provider  atenolol (TENORMIN) 25 MG tablet Take 1 tablet (25 mg total) by mouth daily. 12/27/16  Yes [name], [name], MD  atorvastatin (LIPITOR) 20 MG tablet Take 1 tablet (20 mg total) by mouth daily. 06/05/16  Yes [name], Marne A, MD  latanoprost (XALATAN) 0.005 % ophthalmic solution Place 1 drop into both eyes at bedtime. Use eye drops as directed.    Yes [provider]  metFORMIN (GLUCOPHAGE) 500 MG tablet Take 1 tablet (500 mg total) by mouth daily. 06/05/16  Yes [name], [place]  A, MD  Multiple Vitamin (MULTIVITAMIN) tablet Take 1 tablet by mouth daily.     Yes [provider]  timolol (TIMOPTIC) 0.5 % ophthalmic solution USE 1 DROP IN BOTH EYES IN THE MORNING 07/22/14  Yes [provider]  ACCU-CHEK AVIVA PLUS test strip TEST BLOOD SUGAR ONCE DAILY AND AS NEEDED (DX. E11.9) 07/02/16   [name], [name], MD  ACCU-CHEK SOFTCLIX LANCETS lancets USE DEVICE TO TEST BLOOD SUGAR ONCE DAILY. (DX E11.9) 02/25/17   [name], [name], MD  tiZANidine (ZANAFLEX) 4 MG tablet TAKE 1 TABLET BY MOUTH EVERY 8 HOURS AS NEEDED NO MORE THAN 3 DOSES IN 24 HOURS 01/13/17   [provider]   Dg Lumbar Spine Complete  Result Date: 03/28/2017 CLINICAL DATA:  Fall with pelvic pain EXAM: LUMBAR SPINE - COMPLETE 4+ VIEW COMPARISON:  MRI 09/16/2016 FINDINGS: Five non rib-bearing lumbar type vertebra. Grade 1 anterolisthesis of L5 on S1. Moderate degenerative changes at L5-S1. Vertebral body heights are maintained. Mild degenerative changes at L1-L2 and L2-L3. Aortic  atherosclerosis. IMPRESSION: Grade 1 anterolisthesis of L5 on S1, similar compared to 2018 MRI. No definite acute osseous abnormality. Electronically Signed   By: Donavan Foil M.D.   On: 03/28/2017 17:37   Dg Elbow Complete Right  Result Date: 03/28/2017 CLINICAL DATA:  Fall with elbow pain EXAM: RIGHT ELBOW - COMPLETE 3+ VIEW COMPARISON:  None. FINDINGS: There is no evidence of fracture, dislocation, or joint effusion. There is no evidence of arthropathy or other focal bone abnormality. Soft tissues are unremarkable. IMPRESSION: Negative. Electronically Signed   By: Donavan Foil M.D.   On: 03/28/2017 17:40   Dg Ankle Complete Right  Result Date: 03/28/2017 CLINICAL DATA:  Fall with pain EXAM: RIGHT ANKLE - COMPLETE 3+ VIEW COMPARISON:  None. FINDINGS: No acute displaced fracture or malalignment is seen. Ankle mortise is symmetric. Mild degenerative changes. IMPRESSION: No acute osseous abnormality Electronically Signed   By: Donavan Foil M.D.   On: 03/28/2017 17:40   Dg Knee Complete 4 Views Right  Result Date: 03/28/2017 CLINICAL DATA:  Fall EXAM: RIGHT KNEE - COMPLETE 4+ VIEW COMPARISON:  07/01/2011 FINDINGS: No acute displaced fracture or malalignment. Mild patellofemoral degenerative changes with spurring. Trace knee effusion. Mild to moderate degenerative changes at the lateral compartment. Mild degenerative changes at the medial compartment. IMPRESSION: Degenerative changes.  No acute osseous abnormality. Electronically Signed   By: Donavan Foil M.D.   On: 03/28/2017 17:39   Dg Hip Unilat With Pelvis 2-3 Views Right  Result Date: 03/28/2017 CLINICAL DATA:  Fall right hip pain EXAM: DG HIP (WITH OR WITHOUT PELVIS) 2-3V RIGHT COMPARISON:  09/06/2016 FINDINGS: Pubic symphysis and rami are intact. Right femoral head projects in joint. Acute mildly displaced intertrochanteric fracture. IMPRESSION: Acute mildly displaced right intertrochanteric fracture. Electronically Signed   By: Donavan Foil  M.D.   On: 03/28/2017 17:33   Family History Reviewed and non-contributory, no pertinent history of problems with bleeding or anesthesia      Review of Systems 14 system ROS conducted and negative except for that noted in HPI   OBJECTIVE  Vitals: Patient Vitals for the past 8 hrs:  BP Temp Temp src Pulse Resp SpO2 Height Weight  03/29/17 0626 (!) 185/97 - - 96 - - - -  03/29/17 0423 (!) 178/97 98.5 F (36.9 C) Oral 89 16 95 % - -  03/28/17 2341 (!) 175/99 98.6 F (37 C) Oral (!) 102 16 94 % 5' 2"  (1.575 m)  105 lb 13.1 oz (48 kg)   General: Alert, no acute distress Cardiovascular: No pedal edema Respiratory: No cyanosis, no use of accessory musculature GI: No organomegaly, abdomen is soft and non-tender Skin: No lesions in the area of chief complaint other than those listed below in MSK exam.  Neurologic: Sensation intact distally save for the below mentioned MSK exam Psychiatric: Patient is competent for consent with normal mood and affect Lymphatic: No axillary or cervical lymphadenopathy Extremities  RZN:BVAPOLIDC and externally rotated.  ROM deferred. + GS/TA/EHL. Sensation intact in DP/SP/S/S/P distributions. 2+ DP pulse with warm and well perfused digits. Compartments soft and compressible, with no pain on passive stretch.     Test Results Imaging R displaced intertrochanteric femur fracture noted, no other acute osseous abnormalities  Labs cbc Recent Labs    03/28/17 1619  WBC 18.5*  HGB 14.2  HCT 43.1  PLT 245    Labs inflam No results for input(s): CRP in the last 72 hours.  Invalid input(s): ESR  Labs coag Recent Labs    03/28/17 1619  INR 1.12    Recent Labs    03/28/17 1619 03/29/17 0423  NA 136 134*  K 3.5 4.1  CL 97* 97*  CO2 27 28  GLUCOSE 154* 145*  BUN 11 13  CREATININE 0.79 0.88  CALCIUM 9.5 9.3     ASSESSMENT AND PLAN: 77 y.o. female with the following: R displaced intertrochanteric femur fracture  Discussed options and  non-operative versus operative measures.  Non-operative measures have a predictably poor set of outcomes in an ambulatory patient including bedsores and other medical complications.  Understanding this the patient/family elected to proceed with operative measures.  The risks and benefits of  surgical intervention including infection, bleeding, nerve injury, periprosthetic fracture, the need for revision surgery, leg length discrepancy, gait change, blood clots, cardiopulmonary complications, morbidity, mortality, among others, and they were willing to proceed.    Plan for Cephalomedullary nail, WBAT after.

## 2017-03-29 NOTE — Progress Notes (Signed)
PROGRESS NOTE    Candace Newman   DUK:[id number]  DOB: 1940/04/09  DOA: 03/28/2017 PCP: Abner Greenspan, MD   Brief Narrative:  Candace Newman is a 77 y.o. female with  HTN, DM2, tobacco abuse, HLD, osteopenia who presents after a mechanical fall and is found to have a right hip fracture.  Subjective: Recently had a cold recently- now only has a mild cough remaining with very little mucous ROS: no complaints of nausea, vomiting, constipation diarrhea, cough, dyspnea or dysuria. No other complaints.   Assessment & Plan:   Principal Problem:   Closed intertrochanteric fracture of right hip -  Cephalomedullary nail Intertrochanteric femur fracture- Dr Griffin Basil - f/u recommendations by ortho - celebrex ordered by ortho   Active Problems:   Diabetes type 2, controlled   - A1c 6.0 - cont SSI in hospital- takes Metformin at home    Hyperlipidemia - cont statin    Essential hypertension - cont atenolol    Tobacco abuse - nicotine patch- counseled  - probable COPD-need outpt PFTs  leukocytosis - cough mentioned is getting better- CXR clear- no fevers - no dysuria or frequency of micturation  DVT prophylaxis: Lovenox Code Status: Full code Family Communication: Disposition Plan: f/u PT eval Consultants:   ortho Procedures:   Right hip Cephalomedullary nail  Antimicrobials:  Anti-infectives (From admission, onward)   Start     Dose/Rate Route Frequency Ordered Stop   03/29/17 1345  ceFAZolin (ANCEF) IVPB 2g/100 mL premix     2 g 200 mL/hr over 30 Minutes Intravenous Every 6 hours 03/29/17 1104 03/30/17 0144   03/29/17 0730  ceFAZolin (ANCEF) IVPB 2g/100 mL premix     2 g 200 mL/hr over 30 Minutes Intravenous On call to O.R. 03/29/17 0710 03/29/17 0755   03/29/17 0730  ceFAZolin (ANCEF) IVPB 2g/100 mL premix     2 g 200 mL/hr over 30 Minutes Intravenous On call to O.R. 03/29/17 3762 03/30/17 0559   03/29/17 0716  ceFAZolin (ANCEF) 2-4 GM/100ML-% IVPB    Note to  Pharmacy:  Henrine Screws   : cabinet override      03/29/17 0716 03/29/17 0745       Objective: Vitals:   03/29/17 1000 03/29/17 1015 03/29/17 1030 03/29/17 1103  BP: (!) 150/78 134/79 116/81 120/67  Pulse: 99 (!) 103 100 (!) 105  Resp: 13 14 10 20   Temp:    98.7 F (37.1 C)  TempSrc:    Oral  SpO2: 94% 97% 96% 95%  Weight:      Height:        Intake/Output Summary (Last 24 hours) at 03/29/2017 1449 Last data filed at 03/29/2017 0915 Gross per 24 hour  Intake 500 ml  Output 150 ml  Net 350 ml   Filed Weights   03/28/17 2341  Weight: 48 kg (105 lb 13.1 oz)    Examination: General exam: Appears comfortable  HEENT: PERRLA, oral mucosa moist, no sclera icterus or thrush Respiratory system: Clear to auscultation. Respiratory effort normal. Cardiovascular system: S1 & S2 heard, RRR.  No murmurs  Gastrointestinal system: Abdomen soft, non-tender, nondistended. Normal bowel sound. No organomegaly Central nervous system: Alert and oriented. No focal neurological deficits. Extremities: No cyanosis, clubbing or edema- dressing on right thigh noted. Skin: No rashes or ulcers Psychiatry:  Mood & affect appropriate.     Data Reviewed: I have personally reviewed following labs and imaging studies  CBC: Recent Labs  Lab 03/28/17 1619 03/29/17 0943  WBC  18.5* 18.3*  NEUTROABS 16.7*  --   HGB 14.2 12.5  HCT 43.1 37.5  MCV 90.7 90.6  PLT 245 240   Basic Metabolic Panel: Recent Labs  Lab 03/28/17 1619 03/29/17 0423  NA 136 134*  K 3.5 4.1  CL 97* 97*  CO2 27 28  GLUCOSE 154* 145*  BUN 11 13  CREATININE 0.79 0.88  CALCIUM 9.5 9.3   GFR: Estimated Creatinine Clearance: 40.6 mL/min (by C-G formula based on SCr of 0.88 mg/dL). Liver Function Tests: Recent Labs  Lab 03/29/17 0423  ALBUMIN 3.9   No results for input(s): LIPASE, AMYLASE in the last 168 hours. No results for input(s): AMMONIA in the last 168 hours. Coagulation Profile: Recent Labs  Lab  03/28/17 1619  INR 1.12   Cardiac Enzymes: No results for input(s): CKTOTAL, CKMB, CKMBINDEX, TROPONINI in the last 168 hours. BNP (last 3 results) No results for input(s): PROBNP in the last 8760 hours. HbA1C: Recent Labs    03/29/17 0423  HGBA1C 6.0*   CBG: Recent Labs  Lab 03/29/17 0013 03/29/17 0420 03/29/17 0623 03/29/17 0914 03/29/17 1121  GLUCAP 173* 124* 154* 150* 210*   Lipid Profile: No results for input(s): CHOL, HDL, LDLCALC, TRIG, CHOLHDL, LDLDIRECT in the last 72 hours. Thyroid Function Tests: No results for input(s): TSH, T4TOTAL, FREET4, T3FREE, THYROIDAB in the last 72 hours. Anemia Panel: No results for input(s): VITAMINB12, FOLATE, FERRITIN, TIBC, IRON, RETICCTPCT in the last 72 hours. Urine analysis: No results found for: COLORURINE, APPEARANCEUR, LABSPEC, PHURINE, GLUCOSEU, HGBUR, BILIRUBINUR, KETONESUR, PROTEINUR, UROBILINOGEN, NITRITE, LEUKOCYTESUR Sepsis Labs: @LABRCNTIP (procalcitonin:4,lacticidven:4) ) Recent Results (from the past 240 hour(s))  Surgical pcr screen     Status: None   Collection Time: 03/29/17  6:20 AM  Result Value Ref Range Status   MRSA, PCR NEGATIVE NEGATIVE Final   Staphylococcus aureus NEGATIVE NEGATIVE Final    Comment: (NOTE) The Xpert SA Assay (FDA approved for NASAL specimens in patients 77 years of age and older), is one component of a comprehensive surveillance program. It is not intended to diagnose infection nor to guide or monitor treatment. Performed at [facility], [place] [address] [place], [place] [address]          Radiology Studies: Dg Lumbar Spine Complete  Result Date: 03/28/2017 CLINICAL DATA:  Fall with pelvic pain EXAM: LUMBAR SPINE - COMPLETE 4+ VIEW COMPARISON:  MRI 09/16/2016 FINDINGS: Five non rib-bearing lumbar type vertebra. Grade 1 anterolisthesis of L5 on S1. Moderate degenerative changes at L5-S1. Vertebral body heights are maintained. Mild degenerative changes at L1-L2 and  L2-L3. Aortic atherosclerosis. IMPRESSION: Grade 1 anterolisthesis of L5 on S1, similar compared to 2018 MRI. No definite acute osseous abnormality. Electronically Signed   By: Donavan Foil M.D.   On: 03/28/2017 17:37   Dg Elbow Complete Right  Result Date: 03/28/2017 CLINICAL DATA:  Fall with elbow pain EXAM: RIGHT ELBOW - COMPLETE 3+ VIEW COMPARISON:  None. FINDINGS: There is no evidence of fracture, dislocation, or joint effusion. There is no evidence of arthropathy or other focal bone abnormality. Soft tissues are unremarkable. IMPRESSION: Negative. Electronically Signed   By: Donavan Foil M.D.   On: 03/28/2017 17:40   Dg Ankle Complete Right  Result Date: 03/28/2017 CLINICAL DATA:  Fall with pain EXAM: RIGHT ANKLE - COMPLETE 3+ VIEW COMPARISON:  None. FINDINGS: No acute displaced fracture or malalignment is seen. Ankle mortise is symmetric. Mild degenerative changes. IMPRESSION: No acute osseous abnormality Electronically Signed   By: Maudie Mercury  Francoise Ceo M.D.   On: 03/28/2017 17:40   Chest Portable 1 View  Result Date: 03/29/2017 CLINICAL DATA:  Preoperative evaluation. EXAM: PORTABLE CHEST 1 VIEW COMPARISON:  None. FINDINGS: Normal sized heart. Clear lungs. The lungs are hyperexpanded with mild diffuse peribronchial thickening. Mild scoliosis. Diffuse osteopenia. IMPRESSION: No acute abnormality.  Mild changes of COPD and chronic bronchitis. Electronically Signed   By: Claudie Revering M.D.   On: 03/29/2017 07:58   Dg Knee Complete 4 Views Right  Result Date: 03/28/2017 CLINICAL DATA:  Fall EXAM: RIGHT KNEE - COMPLETE 4+ VIEW COMPARISON:  07/01/2011 FINDINGS: No acute displaced fracture or malalignment. Mild patellofemoral degenerative changes with spurring. Trace knee effusion. Mild to moderate degenerative changes at the lateral compartment. Mild degenerative changes at the medial compartment. IMPRESSION: Degenerative changes.  No acute osseous abnormality. Electronically Signed   By: Donavan Foil  M.D.   On: 03/28/2017 17:39   Dg C-arm 1-60 Min  Result Date: 03/29/2017 CLINICAL DATA:  Right intertrochanteric fracture fixation. EXAM: RIGHT FEMUR 2 VIEWS; DG C-ARM 61-120 MIN COMPARISON:  Right hip x-rays from yesterday. FINDINGS: Multiple intraoperative x-rays demonstrate interval cephalomedullary rod fixation of the right intertrochanteric femur fracture. Alignment is anatomic. No acute abnormality. IMPRESSION: Right intertrochanteric femur fracture cephalomedullary rod fixation in anatomic alignment. FLUOROSCOPY TIME:  1 minutes, 34 seconds. C-arm fluoroscopic images were obtained intraoperatively and submitted for post operative interpretation. Electronically Signed   By: Titus Dubin M.D.   On: 03/29/2017 10:24   Dg Hip Unilat With Pelvis 2-3 Views Right  Result Date: 03/28/2017 CLINICAL DATA:  Fall right hip pain EXAM: DG HIP (WITH OR WITHOUT PELVIS) 2-3V RIGHT COMPARISON:  09/06/2016 FINDINGS: Pubic symphysis and rami are intact. Right femoral head projects in joint. Acute mildly displaced intertrochanteric fracture. IMPRESSION: Acute mildly displaced right intertrochanteric fracture. Electronically Signed   By: Donavan Foil M.D.   On: 03/28/2017 17:33   Dg Femur, Min 2 Views Right  Result Date: 03/29/2017 CLINICAL DATA:  Right intertrochanteric fracture fixation. EXAM: RIGHT FEMUR 2 VIEWS; DG C-ARM 61-120 MIN COMPARISON:  Right hip x-rays from yesterday. FINDINGS: Multiple intraoperative x-rays demonstrate interval cephalomedullary rod fixation of the right intertrochanteric femur fracture. Alignment is anatomic. No acute abnormality. IMPRESSION: Right intertrochanteric femur fracture cephalomedullary rod fixation in anatomic alignment. FLUOROSCOPY TIME:  1 minutes, 34 seconds. C-arm fluoroscopic images were obtained intraoperatively and submitted for post operative interpretation. Electronically Signed   By: Titus Dubin M.D.   On: 03/29/2017 10:24   Dg Femur Port, Min 2 Views  Right  Result Date: 03/29/2017 CLINICAL DATA:  Postop check RIGHT femoral nailing EXAM: RIGHT FEMUR PORTABLE 2 VIEW COMPARISON:  Intraoperative images 03/26/2017 FINDINGS: IM nail with proximal compression screw and distal locking screw identified within RIGHT femur post ORIF of a reduced intertrochanteric fracture of the RIGHT femur. Bones appear demineralized. RIGHT hip joint space preserved without dislocation. Degenerative changes RIGHT knee. IMPRESSION: Post ORIF of an intertrochanteric fracture of the RIGHT femur. Electronically Signed   By: Lavonia Dana M.D.   On: 03/29/2017 10:25      Scheduled Meds: . acetaminophen  1,000 mg Oral Q6H  . atenolol  25 mg Oral Daily  . atorvastatin  20 mg Oral Daily  . celecoxib  200 mg Oral BID  . docusate sodium  100 mg Oral BID  . [START ON 03/30/2017] enoxaparin (LOVENOX) injection  40 mg Subcutaneous Q24H  . insulin aspart  0-9 Units Subcutaneous Q4H  . latanoprost  1 drop Both  Eyes QHS  . methocarbamol      . nicotine  21 mg Transdermal Daily  . timolol  1 drop Both Eyes Daily   Continuous Infusions: .  ceFAZolin (ANCEF) IV 2 g (03/29/17 1328)  .  ceFAZolin (ANCEF) IV    . methocarbamol (ROBAXIN)  IV       LOS: 1 day    Time spent in minutes: 35    Debbe Odea, MD [facility] Pager: [email or web address] Password [facility] 03/29/2017, 2:49 PM

## 2017-03-29 NOTE — Plan of Care (Signed)
  Problem: Pain Managment: Goal: General experience of comfort will improve Outcome: Progressing   Problem: Clinical Measurements: Goal: Cardiovascular complication will be avoided Outcome: Progressing   

## 2017-03-29 NOTE — Op Note (Signed)
Orthopaedic Surgery Operative Note (CSN: [id number])  Candace Newman  1940-05-07 Date of Surgery: 03/28/2017 - 03/29/2017   Diagnoses:  Right intertrochanteric femur fracture  Procedure: 27245 - Cephalomedullary nail  Intertrochanteric femur fracture   Operative Finding Successful completion of planned procedure.  Good fixation, short nail in place.    Post-operative plan: The patient will be WBAT.  The patient will be admitted back to hospitalist.  DVT prophylaxis Lovenox x4 weeks, likely transition to asa after if mobilizing.  Pain control with PRN pain medication preferring oral medicines.  Follow up plan will be scheduled in approximately 14 days for incision check and XR.  Post-Op Diagnosis: Same Surgeons:Primary: Hiram Gash, MD Assistants:None Location: [facility] OR ROOM 06 Anesthesia: General Antibiotics: Ancef 2g preop Tourniquet time: * No tourniquets in log * Estimated Blood Loss: 938 Complications: None Specimens: None Implants: * No implants in log *  Indications for Surgery:   Candace Newman is a 77 y.o. female with fall resulting in R intertrochanteric femur fracture.  Discussed options and non-operative versus operative measures.  Non-operative measures have a predictably poor set of outcomes in an ambulatory patient including bedsores and other medical complications.  Understanding this the patient/family elected to proceed with operative measures.  The risks and benefits of  surgical intervention including infection, bleeding, nerve injury, periprosthetic fracture, the need for revision surgery, leg length discrepancy, gait change, blood clots, cardiopulmonary complications, morbidity, mortality, among others, and they were willing to proceed.    Procedure:   The patient was identified in the preoperative holding area where the surgical site was marked. The patient was taken to the OR where a procedural timeout was called and the above noted anesthesia was induced.  The  patient was positioned supine on fracture table.  Preoperative antibiotics were dosed.  The patient's right hip was prepped and draped in the usual sterile fashion.  A second preoperative timeout was called.      The patient was placed supine on a fracture table and appropriate reduction was obtained and visualized on fluoroscopy prior to the beginning of the procedure.  We made an incision proximal to the greater trochanter and dissected down through the fascia.  We then carefully placed our starting awl with cutting edges to 55mm localizing under fluoroscopy prior to advancing the awl into the bone and sliding a ball-tipped guidewire through the awl into the femoral canal.  The wire was passed to an appropriate level at the fascial scar of the distal femur and measurement was obtained proximally using fluoroscopy.  We selected a length of nail noted above.  Entry reamer was not needed as the awl had a built in opening reamer built in.  At this point we placed our nail localizing under fluoroscopy.  We found the canal to be somewhat tight thus we reamed to a 37mm canal before passing our nail.  We then used the outrigger device to place a wire and cephalomedullary screw in the typical fashion with a tip apex combined distance <92mm.  We used a blunt bone hook to hold our reduction during this.    The screw was locked proximally to avoid over collapse.  We took final shots at the proximal femur and then used perfect circle technique to place one distal interlock screw.  Final pictures were obtained.  The wounds were thoroughly irrigated closed in a multilayer fashion with absorable sutures.  A sterile dressing was placed.  The patient was awoken from general anesthesia and  taken to the PACU in stable condition without complication.

## 2017-03-29 NOTE — H&P (View-Only) (Signed)
ORTHOPAEDIC CONSULTATION  REQUESTING PHYSICIAN: Debbe Odea, MD  Chief Complaint: R hip pain  HPI: Candace Newman is a 77 y.o. female with  acute traumatic fall from standing resulting in R hip pain and inability to ambulate.  No LOC, at baseline state of health prior to fall.  Ambulates without device and lives with husband prior to surgery.  Reports significant pain at site of injury, no other areas of pain reported acutely.    Past Medical History:  Diagnosis Date  . Cataract    REMOVED BILATERAL  . Diabetes mellitus    type II  . Glaucoma    BILATERAL  . Hyperlipidemia   . Hypertension   . Osteopenia    Past Surgical History:  Procedure Laterality Date  . ABDOMINAL HYSTERECTOMY     ovaries intact -non cancer done for bleeding   Social History   Socioeconomic History  . Marital status: Married    Spouse name: Not on file  . Number of children: Not on file  . Years of education: Not on file  . Highest education level: Not on file  Occupational History  . Not on file  Social Needs  . Financial resource strain: Not on file  . Food insecurity:    Worry: Not on file    Inability: Not on file  . Transportation needs:    Medical: Not on file    Non-medical: Not on file  Tobacco Use  . Smoking status: Former Smoker    Types: Cigarettes    Last attempt to quit: 01/07/2005    Years since quitting: 12.2  . Smokeless tobacco: Never Used  Substance and Sexual Activity  . Alcohol use: No    Alcohol/week: 0.0 oz  . Drug use: No  . Sexual activity: Never  Lifestyle  . Physical activity:    Days per week: Not on file    Minutes per session: Not on file  . Stress: Not on file  Relationships  . Social connections:    Talks on phone: Not on file    Gets together: Not on file    Attends religious service: Not on file    Active member of club or organization: Not on file    Attends meetings of clubs or organizations: Not on file    Relationship status: Not  on file  Other Topics Concern  . Not on file  Social History Narrative  . Not on file   Family History  Problem Relation Age of Onset  . Heart disease Mother        CAD  . Hypertension Mother   . Heart disease Father        CAD  . Diabetes Brother   . Colon cancer Brother   . Cancer Brother        lymph node CA   No Known Allergies Prior to Admission medications   Medication Sig Start Date End Date Taking? Authorizing Provider  atenolol (TENORMIN) 25 MG tablet Take 1 tablet (25 mg total) by mouth daily. 12/27/16  Yes [name], [name], MD  atorvastatin (LIPITOR) 20 MG tablet Take 1 tablet (20 mg total) by mouth daily. 06/05/16  Yes [name], Marne A, MD  latanoprost (XALATAN) 0.005 % ophthalmic solution Place 1 drop into both eyes at bedtime. Use eye drops as directed.    Yes [provider]  metFORMIN (GLUCOPHAGE) 500 MG tablet Take 1 tablet (500 mg total) by mouth daily. 06/05/16  Yes [name], [place]  A, MD  Multiple Vitamin (MULTIVITAMIN) tablet Take 1 tablet by mouth daily.     Yes [provider]  timolol (TIMOPTIC) 0.5 % ophthalmic solution USE 1 DROP IN BOTH EYES IN THE MORNING 07/22/14  Yes [provider]  ACCU-CHEK AVIVA PLUS test strip TEST BLOOD SUGAR ONCE DAILY AND AS NEEDED (DX. E11.9) 07/02/16   [name], [name], MD  ACCU-CHEK SOFTCLIX LANCETS lancets USE DEVICE TO TEST BLOOD SUGAR ONCE DAILY. (DX E11.9) 02/25/17   [name], [name], MD  tiZANidine (ZANAFLEX) 4 MG tablet TAKE 1 TABLET BY MOUTH EVERY 8 HOURS AS NEEDED NO MORE THAN 3 DOSES IN 24 HOURS 01/13/17   [provider]   Dg Lumbar Spine Complete  Result Date: 03/28/2017 CLINICAL DATA:  Fall with pelvic pain EXAM: LUMBAR SPINE - COMPLETE 4+ VIEW COMPARISON:  MRI 09/16/2016 FINDINGS: Five non rib-bearing lumbar type vertebra. Grade 1 anterolisthesis of L5 on S1. Moderate degenerative changes at L5-S1. Vertebral body heights are maintained. Mild degenerative changes at L1-L2 and L2-L3. Aortic  atherosclerosis. IMPRESSION: Grade 1 anterolisthesis of L5 on S1, similar compared to 2018 MRI. No definite acute osseous abnormality. Electronically Signed   By: Donavan Foil M.D.   On: 03/28/2017 17:37   Dg Elbow Complete Right  Result Date: 03/28/2017 CLINICAL DATA:  Fall with elbow pain EXAM: RIGHT ELBOW - COMPLETE 3+ VIEW COMPARISON:  None. FINDINGS: There is no evidence of fracture, dislocation, or joint effusion. There is no evidence of arthropathy or other focal bone abnormality. Soft tissues are unremarkable. IMPRESSION: Negative. Electronically Signed   By: Donavan Foil M.D.   On: 03/28/2017 17:40   Dg Ankle Complete Right  Result Date: 03/28/2017 CLINICAL DATA:  Fall with pain EXAM: RIGHT ANKLE - COMPLETE 3+ VIEW COMPARISON:  None. FINDINGS: No acute displaced fracture or malalignment is seen. Ankle mortise is symmetric. Mild degenerative changes. IMPRESSION: No acute osseous abnormality Electronically Signed   By: Donavan Foil M.D.   On: 03/28/2017 17:40   Dg Knee Complete 4 Views Right  Result Date: 03/28/2017 CLINICAL DATA:  Fall EXAM: RIGHT KNEE - COMPLETE 4+ VIEW COMPARISON:  07/01/2011 FINDINGS: No acute displaced fracture or malalignment. Mild patellofemoral degenerative changes with spurring. Trace knee effusion. Mild to moderate degenerative changes at the lateral compartment. Mild degenerative changes at the medial compartment. IMPRESSION: Degenerative changes.  No acute osseous abnormality. Electronically Signed   By: Donavan Foil M.D.   On: 03/28/2017 17:39   Dg Hip Unilat With Pelvis 2-3 Views Right  Result Date: 03/28/2017 CLINICAL DATA:  Fall right hip pain EXAM: DG HIP (WITH OR WITHOUT PELVIS) 2-3V RIGHT COMPARISON:  09/06/2016 FINDINGS: Pubic symphysis and rami are intact. Right femoral head projects in joint. Acute mildly displaced intertrochanteric fracture. IMPRESSION: Acute mildly displaced right intertrochanteric fracture. Electronically Signed   By: Donavan Foil  M.D.   On: 03/28/2017 17:33   Family History Reviewed and non-contributory, no pertinent history of problems with bleeding or anesthesia      Review of Systems 14 system ROS conducted and negative except for that noted in HPI   OBJECTIVE  Vitals: Patient Vitals for the past 8 hrs:  BP Temp Temp src Pulse Resp SpO2 Height Weight  03/29/17 0626 (!) 185/97 - - 96 - - - -  03/29/17 0423 (!) 178/97 98.5 F (36.9 C) Oral 89 16 95 % - -  03/28/17 2341 (!) 175/99 98.6 F (37 C) Oral (!) 102 16 94 % 5' 2"  (1.575 m)  105 lb 13.1 oz (48 kg)   General: Alert, no acute distress Cardiovascular: No pedal edema Respiratory: No cyanosis, no use of accessory musculature GI: No organomegaly, abdomen is soft and non-tender Skin: No lesions in the area of chief complaint other than those listed below in MSK exam.  Neurologic: Sensation intact distally save for the below mentioned MSK exam Psychiatric: Patient is competent for consent with normal mood and affect Lymphatic: No axillary or cervical lymphadenopathy Extremities  APT:CKFWBLTGA and externally rotated.  ROM deferred. + GS/TA/EHL. Sensation intact in DP/SP/S/S/P distributions. 2+ DP pulse with warm and well perfused digits. Compartments soft and compressible, with no pain on passive stretch.     Test Results Imaging R displaced intertrochanteric femur fracture noted, no other acute osseous abnormalities  Labs cbc Recent Labs    03/28/17 1619  WBC 18.5*  HGB 14.2  HCT 43.1  PLT 245    Labs inflam No results for input(s): CRP in the last 72 hours.  Invalid input(s): ESR  Labs coag Recent Labs    03/28/17 1619  INR 1.12    Recent Labs    03/28/17 1619 03/29/17 0423  NA 136 134*  K 3.5 4.1  CL 97* 97*  CO2 27 28  GLUCOSE 154* 145*  BUN 11 13  CREATININE 0.79 0.88  CALCIUM 9.5 9.3     ASSESSMENT AND PLAN: 77 y.o. female with the following: R displaced intertrochanteric femur fracture  Discussed options and  non-operative versus operative measures.  Non-operative measures have a predictably poor set of outcomes in an ambulatory patient including bedsores and other medical complications.  Understanding this the patient/family elected to proceed with operative measures.  The risks and benefits of  surgical intervention including infection, bleeding, nerve injury, periprosthetic fracture, the need for revision surgery, leg length discrepancy, gait change, blood clots, cardiopulmonary complications, morbidity, mortality, among others, and they were willing to proceed.    Plan for Cephalomedullary nail, WBAT after.

## 2017-03-29 NOTE — Progress Notes (Signed)
Dr. Conrad Attica made aware that patient has not had atenolol. This medication is not in Short Stay pyxis. Anesthesia made aware.

## 2017-03-29 NOTE — Transfer of Care (Signed)
Immediate Anesthesia Transfer of Care Note  Patient: Candace Newman  Procedure(s) Performed: INTRAMEDULLARY (IM) NAIL INTERTROCHANTRIC (Right )  Patient Location: PACU  Anesthesia Type:General  Level of Consciousness: awake, alert  and oriented  Airway & Oxygen Therapy: Patient Spontanous Breathing and Patient connected to nasal cannula oxygen  Post-op Assessment: Report given to RN, Post -op Vital signs reviewed and stable and Patient moving all extremities  Post vital signs: Reviewed and stable  Last Vitals:  Vitals Value Taken Time  BP    Temp    Pulse    Resp    SpO2      Last Pain:  Vitals:   03/29/17 0423  TempSrc: Oral  PainSc:          Complications: No apparent anesthesia complications

## 2017-03-29 NOTE — Progress Notes (Signed)
Orthopedic Tech Progress Note Patient Details:  Candace Newman 30-Jul-1940 [id number]  Patient ID: Candace Newman, female   DOB: 28-May-1940, 77 y.o.   MRN: [id number] Pt cant have ohf due to age restrictions  Candace Newman, Candace Newman 03/29/2017, 6:58 PM

## 2017-03-29 NOTE — Anesthesia Preprocedure Evaluation (Addendum)
Anesthesia Evaluation  Patient identified by MRN, date of birth, ID band Patient awake    Reviewed: Allergy & Precautions, NPO status , Patient's Chart, lab work & pertinent test results, reviewed documented beta blocker date and time   Airway Mallampati: II  TM Distance: >3 FB Neck ROM: Full    Dental  (+) Edentulous Upper, Dental Advisory Given   Pulmonary former smoker,    Pulmonary exam normal        Cardiovascular hypertension, Pt. on medications Normal cardiovascular exam     Neuro/Psych    GI/Hepatic   Endo/Other  diabetes, Type 2, Oral Hypoglycemic Agents  Renal/GU      Musculoskeletal   Abdominal   Peds  Hematology   Anesthesia Other Findings   Reproductive/Obstetrics                            Anesthesia Physical Anesthesia Plan  ASA: III  Anesthesia Plan: General   Post-op Pain Management:    Induction: Intravenous  PONV Risk Score and Plan: 3 and Treatment may vary due to age or medical condition  Airway Management Planned: Oral ETT  Additional Equipment:   Intra-op Plan:   Post-operative Plan: Extubation in OR  Informed Consent: I have reviewed the patients History and Physical, chart, labs and discussed the procedure including the risks, benefits and alternatives for the proposed anesthesia with the patient or authorized representative who has indicated his/her understanding and acceptance.   Dental advisory given  Plan Discussed with: CRNA and Surgeon  Anesthesia Plan Comments:        Anesthesia Quick Evaluation

## 2017-03-29 NOTE — Anesthesia Procedure Notes (Signed)
Procedure Name: Intubation Date/Time: 03/29/2017 7:44 AM Performed by: Kyung Rudd, CRNA Pre-anesthesia Checklist: Patient identified, Emergency Drugs available, Suction available and Patient being monitored Patient Re-evaluated:Patient Re-evaluated prior to induction Oxygen Delivery Method: Circle system utilized Preoxygenation: Pre-oxygenation with 100% oxygen Induction Type: IV induction Ventilation: Mask ventilation without difficulty Laryngoscope Size: Mac and 3 Grade View: Grade I Tube type: Oral Tube size: 7.0 mm Number of attempts: 1 Airway Equipment and Method: Stylet Placement Confirmation: ETT inserted through vocal cords under direct vision,  positive ETCO2 and breath sounds checked- equal and bilateral Secured at: 20 cm Tube secured with: Tape Dental Injury: Teeth and Oropharynx as per pre-operative assessment

## 2017-03-29 NOTE — Interval H&P Note (Signed)
Discussed case, risks and benefits with patient again.  All questions answered, no change to history.  Darienne Belleau Murguia MD  

## 2017-03-29 NOTE — Anesthesia Postprocedure Evaluation (Signed)
Anesthesia Post Note  Patient: Candace Newman  Procedure(s) Performed: INTRAMEDULLARY (IM) NAIL INTERTROCHANTRIC (Right )     Patient location during evaluation: PACU Anesthesia Type: General Level of consciousness: awake and alert Pain management: pain level controlled Vital Signs Assessment: post-procedure vital signs reviewed and stable Respiratory status: spontaneous breathing, nonlabored ventilation, respiratory function stable and patient connected to nasal cannula oxygen Cardiovascular status: blood pressure returned to baseline and stable Postop Assessment: no apparent nausea or vomiting Anesthetic complications: no    Last Vitals:  Vitals:   03/29/17 1030 03/29/17 1103  BP: 116/81 120/67  Pulse: 100 (!) 105  Resp: 10 20  Temp:  37.1 C  SpO2: 96% 95%    Last Pain:  Vitals:   03/29/17 1103  TempSrc: Oral  PainSc: 2                  Janmarie Smoot DAVID

## 2017-03-30 LAB — BASIC METABOLIC PANEL
ANION GAP: 8 (ref 5–15)
BUN: 26 mg/dL — ABNORMAL HIGH (ref 6–20)
CHLORIDE: 98 mmol/L — AB (ref 101–111)
CO2: 23 mmol/L (ref 22–32)
CREATININE: 1.14 mg/dL — AB (ref 0.44–1.00)
Calcium: 8.6 mg/dL — ABNORMAL LOW (ref 8.9–10.3)
GFR calc non Af Amer: 45 mL/min — ABNORMAL LOW (ref >60)
GFR, EST AFRICAN AMERICAN: 52 mL/min — AB (ref >60)
Glucose, Bld: 150 mg/dL — ABNORMAL HIGH (ref 65–99)
POTASSIUM: 4.6 mmol/L (ref 3.5–5.1)
Sodium: 129 mmol/L — ABNORMAL LOW (ref 135–145)

## 2017-03-30 LAB — GLUCOSE, CAPILLARY
GLUCOSE-CAPILLARY: 221 mg/dL — AB (ref 65–99)
GLUCOSE-CAPILLARY: 162 mg/dL — AB (ref 65–99)
GLUCOSE-CAPILLARY: 173 mg/dL — AB (ref 65–99)
Glucose-Capillary: 197 mg/dL — ABNORMAL HIGH (ref 65–99)
Glucose-Capillary: 140 mg/dL — ABNORMAL HIGH (ref 65–99)

## 2017-03-30 MED ORDER — ONDANSETRON HCL 4 MG PO TABS: 4.0000 mg | ORAL_TABLET | Freq: Three times a day (TID) | ORAL | 1 refills | Status: AC | PRN

## 2017-03-30 MED ORDER — ACETAMINOPHEN 500 MG PO TABS: 1000.0000 mg | ORAL_TABLET | Freq: Three times a day (TID) | ORAL | 0 refills | Status: AC

## 2017-03-30 MED ORDER — SODIUM CHLORIDE 0.9 % IV BOLUS (SEPSIS)
500.0000 mL | Freq: Once | INTRAVENOUS | Status: AC
  Administered 2017-03-30: 500 mL via INTRAVENOUS

## 2017-03-30 MED ORDER — ENOXAPARIN SODIUM 40 MG/0.4ML ~~LOC~~ SOLN: 40.0000 mg | SUBCUTANEOUS | 1 refills | Status: DC

## 2017-03-30 MED ORDER — OXYCODONE HCL 5 MG PO TABS
ORAL_TABLET | ORAL | 0 refills | Status: DC
Start: 2017-03-30 — End: 2017-04-04

## 2017-03-30 MED ORDER — SODIUM CHLORIDE 0.9 % IV SOLN
INTRAVENOUS | Status: DC
  Administered 2017-03-30 – 2017-03-31 (×2): via INTRAVENOUS

## 2017-03-30 NOTE — Progress Notes (Addendum)
PROGRESS NOTE    Candace Newman   XQJ:[id number]  DOB: February 28, 1940  DOA: 03/28/2017 PCP: Abner Greenspan, MD   Brief Narrative:  Candace Newman is a 77 y.o. female with  HTN, DM2, tobacco abuse, HLD, osteopenia who presents after a mechanical fall and is found to have a right hip fracture.  Subjective: No complaints today.  ROS: no complaints of nausea, vomiting, constipation diarrhea, cough, dyspnea or dysuria. No other complaints.   Assessment & Plan:   Principal Problem:   Closed intertrochanteric fracture of right hip -  Cephalomedullary nail Intertrochanteric femur fracture- Dr Griffin Basil - f/u recommendations by ortho - celebrex ordered by ortho- d/c due to rise in Cr  Anemia due to acute blood loss/ hyponatremia/ mild rise in Cr - due to volume loss? Have started NS at 75 cc/hr - Hb 14.2 on admission (2/22) - now 9.2- follow CBC tomorrow     Diabetes type 2, controlled   - A1c 6.0 - cont SSI in hospital- takes Metformin at home    Hyperlipidemia - cont statin    Essential hypertension - cont atenolol    Tobacco abuse - nicotine patch- counseled  - probable COPD-need outpt PFTs  leukocytosis - cough mentioned is getting better- CXR clear- no fevers - no dysuria or frequency of micturition - WBC imploved from 18 on admission to 12.1- possibly was a stress response  DVT prophylaxis: Lovenox Code Status: Full code Family Communication: Disposition Plan: f/u PT eval Consultants:   ortho Procedures:   Right hip Cephalomedullary nail  Antimicrobials:  Anti-infectives (From admission, onward)   Start     Dose/Rate Route Frequency Ordered Stop   03/29/17 1345  ceFAZolin (ANCEF) IVPB 2g/100 mL premix     2 g 200 mL/hr over 30 Minutes Intravenous Every 6 hours 03/29/17 1104 03/29/17 2123   03/29/17 0730  ceFAZolin (ANCEF) IVPB 2g/100 mL premix     2 g 200 mL/hr over 30 Minutes Intravenous On call to O.R. 03/29/17 0710 03/29/17 0755   03/29/17 0730   ceFAZolin (ANCEF) IVPB 2g/100 mL premix     2 g 200 mL/hr over 30 Minutes Intravenous On call to O.R. 03/29/17 4481 03/30/17 0559   03/29/17 0716  ceFAZolin (ANCEF) 2-4 GM/100ML-% IVPB    Note to Pharmacy:  Henrine Screws   : cabinet override      03/29/17 0716 03/29/17 0745       Objective: Vitals:   03/29/17 1103 03/29/17 1500 03/29/17 2122 03/30/17 0552  BP: 120/67 113/80 133/76 (!) 160/76  Pulse: (!) 105 97 (!) 102 96  Resp: 20 12  20   Temp: 98.7 F (37.1 C)  98.4 F (36.9 C) 98.6 F (37 C)  TempSrc: Oral  Oral Oral  SpO2: 95% 95% 91% 90%  Weight:      Height:        Intake/Output Summary (Last 24 hours) at 03/30/2017 0940 Last data filed at 03/30/2017 0700 Gross per 24 hour  Intake 510 ml  Output -  Net 510 ml   Filed Weights   03/28/17 2341  Weight: 48 kg (105 lb 13.1 oz)    Examination: General exam: Appears comfortable  HEENT: PERRLA, oral mucosa moist, no sclera icterus or thrush Respiratory system: Clear to auscultation. Respiratory effort normal. Cardiovascular system: S1 & S2 heard, RRR.   Gastrointestinal system: Abdomen soft, non-tender, nondistended. Normal bowel sound. No organomegaly Central nervous system: Alert and oriented. No focal neurological deficits. Extremities: No cyanosis, clubbing or  edema- dressing on right thigh noted. Skin: No rashes or ulcers Psychiatry:  Mood & affect appropriate.     Data Reviewed: I have personally reviewed following labs and imaging studies  CBC: Recent Labs  Lab 03/28/17 1619 03/29/17 0943 03/30/17 0715  WBC 18.5* 18.3* 12.1*  NEUTROABS 16.7*  --   --   HGB 14.2 12.5 9.2*  HCT 43.1 37.5 27.8*  MCV 90.7 90.6 89.4  PLT 245 206 269   Basic Metabolic Panel: Recent Labs  Lab 03/28/17 1619 03/29/17 0423 03/30/17 0715  NA 136 134* 129*  K 3.5 4.1 4.6  CL 97* 97* 98*  CO2 27 28 23   GLUCOSE 154* 145* 150*  BUN 11 13 26*  CREATININE 0.79 0.88 1.14*  CALCIUM 9.5 9.3 8.6*   GFR: Estimated  Creatinine Clearance: 31.3 mL/min (A) (by C-G formula based on SCr of 1.14 mg/dL (H)). Liver Function Tests: Recent Labs  Lab 03/29/17 0423  ALBUMIN 3.9   No results for input(s): LIPASE, AMYLASE in the last 168 hours. No results for input(s): AMMONIA in the last 168 hours. Coagulation Profile: Recent Labs  Lab 03/28/17 1619  INR 1.12   Cardiac Enzymes: No results for input(s): CKTOTAL, CKMB, CKMBINDEX, TROPONINI in the last 168 hours. BNP (last 3 results) No results for input(s): PROBNP in the last 8760 hours. HbA1C: Recent Labs    03/29/17 0423  HGBA1C 6.0*   CBG: Recent Labs  Lab 03/29/17 1607 03/29/17 2127 03/29/17 2350 03/30/17 0403 03/30/17 0839  GLUCAP 283* 238* 191* 197* 221*   Lipid Profile: No results for input(s): CHOL, HDL, LDLCALC, TRIG, CHOLHDL, LDLDIRECT in the last 72 hours. Thyroid Function Tests: No results for input(s): TSH, T4TOTAL, FREET4, T3FREE, THYROIDAB in the last 72 hours. Anemia Panel: No results for input(s): VITAMINB12, FOLATE, FERRITIN, TIBC, IRON, RETICCTPCT in the last 72 hours. Urine analysis: No results found for: COLORURINE, APPEARANCEUR, LABSPEC, PHURINE, GLUCOSEU, HGBUR, BILIRUBINUR, KETONESUR, PROTEINUR, UROBILINOGEN, NITRITE, LEUKOCYTESUR Sepsis Labs: @LABRCNTIP (procalcitonin:4,lacticidven:4) ) Recent Results (from the past 240 hour(s))  Surgical pcr screen     Status: None   Collection Time: 03/29/17  6:20 AM  Result Value Ref Range Status   MRSA, PCR NEGATIVE NEGATIVE Final   Staphylococcus aureus NEGATIVE NEGATIVE Final    Comment: (NOTE) The Xpert SA Assay (FDA approved for NASAL specimens in patients 94 years of age and older), is one component of a comprehensive surveillance program. It is not intended to diagnose infection nor to guide or monitor treatment. Performed at [facility], [place] [address], [place], [place] [address]          Radiology Studies: Dg Lumbar Spine Complete  Result Date:  03/28/2017 CLINICAL DATA:  Fall with pelvic pain EXAM: LUMBAR SPINE - COMPLETE 4+ VIEW COMPARISON:  MRI 09/16/2016 FINDINGS: Five non rib-bearing lumbar type vertebra. Grade 1 anterolisthesis of L5 on S1. Moderate degenerative changes at L5-S1. Vertebral body heights are maintained. Mild degenerative changes at L1-L2 and L2-L3. Aortic atherosclerosis. IMPRESSION: Grade 1 anterolisthesis of L5 on S1, similar compared to 2018 MRI. No definite acute osseous abnormality. Electronically Signed   By: Donavan Foil M.D.   On: 03/28/2017 17:37   Dg Elbow Complete Right  Result Date: 03/28/2017 CLINICAL DATA:  Fall with elbow pain EXAM: RIGHT ELBOW - COMPLETE 3+ VIEW COMPARISON:  None. FINDINGS: There is no evidence of fracture, dislocation, or joint effusion. There is no evidence of arthropathy or other focal bone abnormality. Soft tissues are unremarkable. IMPRESSION: Negative. Electronically  Signed   By: Donavan Foil M.D.   On: 03/28/2017 17:40   Dg Ankle Complete Right  Result Date: 03/28/2017 CLINICAL DATA:  Fall with pain EXAM: RIGHT ANKLE - COMPLETE 3+ VIEW COMPARISON:  None. FINDINGS: No acute displaced fracture or malalignment is seen. Ankle mortise is symmetric. Mild degenerative changes. IMPRESSION: No acute osseous abnormality Electronically Signed   By: Donavan Foil M.D.   On: 03/28/2017 17:40   Chest Portable 1 View  Result Date: 03/29/2017 CLINICAL DATA:  Preoperative evaluation. EXAM: PORTABLE CHEST 1 VIEW COMPARISON:  None. FINDINGS: Normal sized heart. Clear lungs. The lungs are hyperexpanded with mild diffuse peribronchial thickening. Mild scoliosis. Diffuse osteopenia. IMPRESSION: No acute abnormality.  Mild changes of COPD and chronic bronchitis. Electronically Signed   By: Claudie Revering M.D.   On: 03/29/2017 07:58   Dg Knee Complete 4 Views Right  Result Date: 03/28/2017 CLINICAL DATA:  Fall EXAM: RIGHT KNEE - COMPLETE 4+ VIEW COMPARISON:  07/01/2011 FINDINGS: No acute displaced  fracture or malalignment. Mild patellofemoral degenerative changes with spurring. Trace knee effusion. Mild to moderate degenerative changes at the lateral compartment. Mild degenerative changes at the medial compartment. IMPRESSION: Degenerative changes.  No acute osseous abnormality. Electronically Signed   By: Donavan Foil M.D.   On: 03/28/2017 17:39   Dg C-arm 1-60 Min  Result Date: 03/29/2017 CLINICAL DATA:  Right intertrochanteric fracture fixation. EXAM: RIGHT FEMUR 2 VIEWS; DG C-ARM 61-120 MIN COMPARISON:  Right hip x-rays from yesterday. FINDINGS: Multiple intraoperative x-rays demonstrate interval cephalomedullary rod fixation of the right intertrochanteric femur fracture. Alignment is anatomic. No acute abnormality. IMPRESSION: Right intertrochanteric femur fracture cephalomedullary rod fixation in anatomic alignment. FLUOROSCOPY TIME:  1 minutes, 34 seconds. C-arm fluoroscopic images were obtained intraoperatively and submitted for post operative interpretation. Electronically Signed   By: Titus Dubin M.D.   On: 03/29/2017 10:24   Dg Hip Unilat With Pelvis 2-3 Views Right  Result Date: 03/28/2017 CLINICAL DATA:  Fall right hip pain EXAM: DG HIP (WITH OR WITHOUT PELVIS) 2-3V RIGHT COMPARISON:  09/06/2016 FINDINGS: Pubic symphysis and rami are intact. Right femoral head projects in joint. Acute mildly displaced intertrochanteric fracture. IMPRESSION: Acute mildly displaced right intertrochanteric fracture. Electronically Signed   By: Donavan Foil M.D.   On: 03/28/2017 17:33   Dg Femur, Min 2 Views Right  Result Date: 03/29/2017 CLINICAL DATA:  Right intertrochanteric fracture fixation. EXAM: RIGHT FEMUR 2 VIEWS; DG C-ARM 61-120 MIN COMPARISON:  Right hip x-rays from yesterday. FINDINGS: Multiple intraoperative x-rays demonstrate interval cephalomedullary rod fixation of the right intertrochanteric femur fracture. Alignment is anatomic. No acute abnormality. IMPRESSION: Right  intertrochanteric femur fracture cephalomedullary rod fixation in anatomic alignment. FLUOROSCOPY TIME:  1 minutes, 34 seconds. C-arm fluoroscopic images were obtained intraoperatively and submitted for post operative interpretation. Electronically Signed   By: Titus Dubin M.D.   On: 03/29/2017 10:24   Dg Femur Port, Min 2 Views Right  Result Date: 03/29/2017 CLINICAL DATA:  Postop check RIGHT femoral nailing EXAM: RIGHT FEMUR PORTABLE 2 VIEW COMPARISON:  Intraoperative images 03/26/2017 FINDINGS: IM nail with proximal compression screw and distal locking screw identified within RIGHT femur post ORIF of a reduced intertrochanteric fracture of the RIGHT femur. Bones appear demineralized. RIGHT hip joint space preserved without dislocation. Degenerative changes RIGHT knee. IMPRESSION: Post ORIF of an intertrochanteric fracture of the RIGHT femur. Electronically Signed   By: Lavonia Dana M.D.   On: 03/29/2017 10:25      Scheduled Meds: . acetaminophen  1,000 mg Oral Q6H  . atenolol  25 mg Oral Daily  . atorvastatin  20 mg Oral Daily  . celecoxib  200 mg Oral BID  . docusate sodium  100 mg Oral BID  . enoxaparin (LOVENOX) injection  40 mg Subcutaneous Q24H  . insulin aspart  0-9 Units Subcutaneous TID WC  . latanoprost  1 drop Both Eyes QHS  . nicotine  7 mg Transdermal Daily  . timolol  1 drop Both Eyes Daily   Continuous Infusions: . methocarbamol (ROBAXIN)  IV       LOS: 2 days    Time spent in minutes: 35    Debbe Odea, MD [facility] Pager: [email or web address] Password [facility] 03/30/2017, 9:40 AM

## 2017-03-30 NOTE — Progress Notes (Signed)
ORTHOPAEDIC PROGRESS NOTE  s/p Procedure(s): INTRAMEDULLARY (IM) NAIL INTERTROCHANTRIC  SUBJECTIVE: Reports mild pain about operative site. No chest pain. No SOB. No nausea/vomiting. No other complaints.  OBJECTIVE: PE: RLE: incision CDI, leg lengths equal, warm well perfused foot, intact EHL/TA/GSC   Vitals:   03/29/17 2122 03/30/17 0552  BP: 133/76 (!) 160/76  Pulse: (!) 102 96  Resp:  20  Temp: 98.4 F (36.9 C) 98.6 F (37 C)  SpO2: 91% 90%     ASSESSMENT: Candace Newman is a 77 y.o. female doing well postoperatively.  PLAN: Weightbearing: WBAT RLE Insicional and dressing care: OK to remove dressings POD3 and leave open to air with dry gauze PRN Orthopedic device(s): None Showering: prn after dressing down VTE prophylaxis: Lovenox 40mg  qd x4 wk and transition to asa Pain control: prn meds Follow - up plan: 2 weeks Contact information:  Weekdays 8-5 Ophelia Charter MD [phone], After hours and holidays please check [email or web address] for group call information for [facility]

## 2017-03-30 NOTE — Evaluation (Signed)
Physical Therapy Evaluation Patient Details Name: Candace Newman MRN: [id number] DOB: 1940-01-26 Today's Date: 03/30/2017   History of Present Illness  Pt is a 77 y.o. female with medical history significant of HTN, DM2, tobacco abuse, HLD, and osteopenia. She presented s/p fall with R hip fx and underwent R hip IM nailing 03-29-17.     Clinical Impression  Pt admitted with above diagnosis. Pt currently with functional limitations due to the deficits listed below (see PT Problem List). PTA pt lived at home with husband, mod I with mobility using cane for ambulation. On eval, she required min assist bed mobility and mod assist transfers with RW. Unable to progress ambulation due to pain with WB RLE and knee buckling. Pt will benefit from skilled PT to increase their independence and safety with mobility to allow discharge to the venue listed below.       Follow Up Recommendations SNF;Supervision/Assistance - 24 hour    Equipment Recommendations  Rolling walker with 5" wheels    Recommendations for Other Services       Precautions / Restrictions Precautions Precautions: Fall Restrictions LLE Weight Bearing: Weight bearing as tolerated      Mobility  Bed Mobility Overal bed mobility: Needs Assistance Bed Mobility: Supine to Sit     Supine to sit: Min assist     General bed mobility comments: verbal cues for sequencing, increased time  Transfers Overall transfer level: Needs assistance Equipment used: Rolling walker (2 wheeled) Transfers: Sit to/from [facility] Sit to Stand: Mod assist Stand pivot transfers: Mod assist       General transfer comment: verbal cues for sequencing, assist to maintain balance (pt retropulsive with weight in heels)  Ambulation/Gait             General Gait Details: unable due to pain with WB RLE/knee buckling  Stairs            Wheelchair Mobility    Modified Rankin (Stroke Patients Only)       Balance  Overall balance assessment: Needs assistance Sitting-balance support: No upper extremity supported;Feet supported Sitting balance-Leahy Scale: Good     Standing balance support: Bilateral upper extremity supported;During functional activity Standing balance-Leahy Scale: Poor Standing balance comment: Reliant on RW and therapist                             Pertinent Vitals/Pain Pain Assessment: Faces Faces Pain Scale: Hurts little more Pain Location: R hip Pain Descriptors / Indicators: Operative site guarding;Sore;Grimacing Pain Intervention(s): Limited activity within patient's tolerance;Monitored during session;Repositioned    Home Living Family/patient expects to be discharged to:: Private residence Living Arrangements: Spouse/significant other Available Help at Discharge: Family;Available 24 hours/day Type of Home: House Home Access: Stairs to enter   [employer] of Steps: 1 Home Layout: One level Home Equipment: Cane - single point      Prior Function Level of Independence: Independent with assistive device(s)         Comments: cane for ambulation     Hand Dominance        Extremity/Trunk Assessment   Upper Extremity Assessment Upper Extremity Assessment: Overall WFL for tasks assessed    Lower Extremity Assessment Lower Extremity Assessment: RLE deficits/detail RLE Deficits / Details: expected deficits following hip sx    Cervical / Trunk Assessment Cervical / Trunk Assessment: Normal  Communication   Communication: No difficulties  Cognition Arousal/Alertness: Awake/alert Behavior During Therapy: WFL for  tasks assessed/performed Overall Cognitive Status: Within Functional Limits for tasks assessed                                        General Comments      Exercises     Assessment/Plan    PT Assessment Patient needs continued PT services  PT Problem List Decreased strength;Decreased  mobility;Decreased activity tolerance;Pain;Decreased balance;Decreased knowledge of use of DME       PT Treatment Interventions DME instruction;Therapeutic activities;Gait training;Therapeutic exercise;Patient/family education;Balance training;Functional mobility training    PT Goals (Current goals can be found in the Care Plan section)  Acute Rehab PT Goals Patient Stated Goal: home PT Goal Formulation: With patient/family Time For Goal Achievement: 04/13/17 Potential to Achieve Goals: Good    Frequency Min 3X/week   Barriers to discharge        Co-evaluation               AM-PAC PT "6 Clicks" Daily Activity  Outcome Measure Difficulty turning over in bed (including adjusting bedclothes, sheets and blankets)?: A Little Difficulty moving from lying on back to sitting on the side of the bed? : A Lot Difficulty sitting down on and standing up from a chair with arms (e.g., wheelchair, bedside commode, etc,.)?: Unable Help needed moving to and from a bed to chair (including a wheelchair)?: A Little Help needed walking in hospital room?: A Lot Help needed climbing 3-5 steps with a railing? : Total 6 Click Score: 12    End of Session Equipment Utilized During Treatment: Gait belt Activity Tolerance: Patient tolerated treatment well Patient left: in chair;with call bell/phone within reach;with family/visitor present Nurse Communication: Mobility status PT Visit Diagnosis: Other abnormalities of gait and mobility (R26.89);Difficulty in walking, not elsewhere classified (R26.2);Pain Pain - Right/Left: Right Pain - part of body: Hip    Time: 9798-9211 PT Time Calculation (min) (ACUTE ONLY): 24 min   Charges:   PT Evaluation $PT Eval Moderate Complexity: 1 Mod PT Treatments $Therapeutic Activity: 8-22 mins   PT G Codes:        Lorrin Goodell, PT  Office # [phone] Pager [phone]   Lorriane Shire 03/30/2017, 10:35 AM

## 2017-03-31 DIAGNOSIS — E871 Hypo-osmolality and hyponatremia: Secondary | ICD-10-CM

## 2017-03-31 LAB — CBC
HCT: 24 % — ABNORMAL LOW (ref 36.0–46.0)
HEMATOCRIT: 27.8 % — AB (ref 36.0–46.0)
HEMOGLOBIN: 9.2 g/dL — AB (ref 12.0–15.0)
HEMOGLOBIN: 8.2 g/dL — AB (ref 12.0–15.0)
MCH: 29.6 pg (ref 26.0–34.0)
MCH: 29.7 pg (ref 26.0–34.0)
MCHC: 33.1 g/dL (ref 30.0–36.0)
MCHC: 34.2 g/dL (ref 30.0–36.0)
MCV: 89.4 fL (ref 78.0–100.0)
MCV: 87 fL (ref 78.0–100.0)
PLATELETS: 169 10*3/uL (ref 150–400)
Platelets: 154 10*3/uL (ref 150–400)
RBC: 3.11 MIL/uL — AB (ref 3.87–5.11)
RBC: 2.76 MIL/uL — AB (ref 3.87–5.11)
RDW: 12.6 % (ref 11.5–15.5)
RDW: 12.6 % (ref 11.5–15.5)
WBC: 12.1 10*3/uL — ABNORMAL HIGH (ref 4.0–10.5)
WBC: 10 10*3/uL (ref 4.0–10.5)

## 2017-03-31 LAB — BASIC METABOLIC PANEL
Anion gap: 9 (ref 5–15)
Anion gap: 7 (ref 5–15)
BUN: 18 mg/dL (ref 6–20)
BUN: 16 mg/dL (ref 6–20)
CHLORIDE: 97 mmol/L — AB (ref 101–111)
CO2: 20 mmol/L — ABNORMAL LOW (ref 22–32)
CO2: 23 mmol/L (ref 22–32)
CREATININE: 0.72 mg/dL (ref 0.44–1.00)
Calcium: 8.1 mg/dL — ABNORMAL LOW (ref 8.9–10.3)
Calcium: 7.9 mg/dL — ABNORMAL LOW (ref 8.9–10.3)
Chloride: 99 mmol/L — ABNORMAL LOW (ref 101–111)
Creatinine, Ser: 0.74 mg/dL (ref 0.44–1.00)
GFR calc Af Amer: >60 mL/min (ref >60)
GFR calc Af Amer: >60 mL/min (ref >60)
GFR calc non Af Amer: >60 mL/min (ref >60)
GFR calc non Af Amer: >60 mL/min (ref >60)
GLUCOSE: 133 mg/dL — AB (ref 65–99)
Glucose, Bld: 151 mg/dL — ABNORMAL HIGH (ref 65–99)
Potassium: 4.2 mmol/L (ref 3.5–5.1)
Potassium: 4.4 mmol/L (ref 3.5–5.1)
Sodium: 126 mmol/L — ABNORMAL LOW (ref 135–145)
Sodium: 129 mmol/L — ABNORMAL LOW (ref 135–145)

## 2017-03-31 LAB — GLUCOSE, CAPILLARY
Glucose-Capillary: 136 mg/dL — ABNORMAL HIGH (ref 65–99)
Glucose-Capillary: 144 mg/dL — ABNORMAL HIGH (ref 65–99)
Glucose-Capillary: 161 mg/dL — ABNORMAL HIGH (ref 65–99)
Glucose-Capillary: 166 mg/dL — ABNORMAL HIGH (ref 65–99)
Glucose-Capillary: 184 mg/dL — ABNORMAL HIGH (ref 65–99)
Glucose-Capillary: 224 mg/dL — ABNORMAL HIGH (ref 65–99)

## 2017-03-31 LAB — VITAMIN D 25 HYDROXY (VIT D DEFICIENCY, FRACTURES): Vit D, 25-Hydroxy: 17.8 ng/mL — ABNORMAL LOW (ref 30.0–100.0)

## 2017-03-31 MED ORDER — SODIUM CHLORIDE 0.9 % IV BOLUS
1000.0000 mL | Freq: Once | INTRAVENOUS | Status: AC
  Administered 2017-03-31: 1000 mL via INTRAVENOUS

## 2017-03-31 NOTE — Progress Notes (Signed)
Physical Therapy Treatment Patient Details Name: Candace Newman MRN: [id number] DOB: 1940/05/10 Today's Date: 03/31/2017    History of Present Illness Pt is a 77 y.o. female with medical history significant of HTN, DM2, tobacco abuse, HLD, and osteopenia. She presented s/p fall with R hip fx and underwent R hip IM nailing 03-29-17.     PT Comments    Patient required mod A for functional transfers. Pt was able to take a few side steps but unable to increase gait distance due to R knee buckling and fatigue. Continue to progress as tolerated with anticipated d/c to SNF for further skilled PT services.     Follow Up Recommendations  SNF;Supervision/Assistance - 24 hour     Equipment Recommendations  Rolling walker with 5" wheels    Recommendations for Other Services       Precautions / Restrictions Precautions Precautions: Fall Restrictions Weight Bearing Restrictions: Yes LLE Weight Bearing: Weight bearing as tolerated    Mobility  Bed Mobility Overal bed mobility: Needs Assistance Bed Mobility: Supine to Sit     Supine to sit: Min assist     General bed mobility comments: cues for sequencing; assist to bring R LE to EOB; HOB elevated  Transfers Overall transfer level: Needs assistance Equipment used: Rolling walker (2 wheeled) Transfers: Sit to/from [facility] Sit to Stand: Mod assist Stand pivot transfers: Mod assist       General transfer comment: cues for safe hand placement and safe use of AD; pt tends to take one hand off of RW and reaches for other objects to to hold onto; assist to power up into standing and for balance in standing due to R LE weakness and knee buckling; pt tends to sit abruptly   Ambulation/Gait             General Gait Details: Pt with R knee buckling; sidesteps taken during stand pivots EOB to [facility] and BSC to recliner   Stairs            Wheelchair Mobility    Modified Rankin (Stroke Patients Only)        Balance Overall balance assessment: Needs assistance Sitting-balance support: No upper extremity supported;Feet supported Sitting balance-Leahy Scale: Good     Standing balance support: Bilateral upper extremity supported;During functional activity Standing balance-Leahy Scale: Poor Standing balance comment: Reliant on RW and therapist                            Cognition Arousal/Alertness: Awake/alert Behavior During Therapy: WFL for tasks assessed/performed Overall Cognitive Status: Within Functional Limits for tasks assessed                                        Exercises      General Comments General comments (skin integrity, edema, etc.): husband present most of session      Pertinent Vitals/Pain Pain Assessment: Faces Faces Pain Scale: Hurts little more Pain Location: R hip Pain Descriptors / Indicators: Sore;Guarding Pain Intervention(s): Limited activity within patient's tolerance;Monitored during session;Repositioned    Home Living                      Prior Function            PT Goals (current goals can now be found in the care plan section) Acute Rehab  PT Goals PT Goal Formulation: With patient/family Time For Goal Achievement: 04/13/17 Potential to Achieve Goals: Good Progress towards PT goals: Progressing toward goals    Frequency    Min 3X/week      PT Plan Current plan remains appropriate    Co-evaluation              AM-PAC PT "6 Clicks" Daily Activity  Outcome Measure  Difficulty turning over in bed (including adjusting bedclothes, sheets and blankets)?: A Little Difficulty moving from lying on back to sitting on the side of the bed? : A Lot Difficulty sitting down on and standing up from a chair with arms (e.g., wheelchair, bedside commode, etc,.)?: Unable Help needed moving to and from a bed to chair (including a wheelchair)?: A Little Help needed walking in hospital room?:  Total Help needed climbing 3-5 steps with a railing? : Total 6 Click Score: 11    End of Session Equipment Utilized During Treatment: Gait belt Activity Tolerance: Patient tolerated treatment well Patient left: in chair;with call bell/phone within reach Nurse Communication: Mobility status;Other (comment)(use gait belt when transferring pt ) PT Visit Diagnosis: Other abnormalities of gait and mobility (R26.89);Difficulty in walking, not elsewhere classified (R26.2);Pain Pain - Right/Left: Right Pain - part of body: Hip     Time: 8413-2440 PT Time Calculation (min) (ACUTE ONLY): 37 min  Charges:  $Therapeutic Activity: 23-37 mins                    G Codes:       Earney Navy, PTA Pager: [phone]     Darliss Cheney 03/31/2017, 1:57 PM

## 2017-03-31 NOTE — Clinical Social Work Note (Signed)
Clinical Social Work Assessment  Patient Details  Name: Candace Newman MRN: [id number] Date of Birth: 1940-11-10  Date of referral:  03/31/17               Reason for consult:  Facility Placement                Permission sought to share information with:  Chartered certified accountant granted to share information::  Yes, Verbal Permission Granted  Name::     Candace Newman  Agency::  SNF  Relationship::  spouse  Contact Information:     Housing/Transportation Living arrangements for the past 2 months:  [place] of Information:  Patient Patient Interpreter Needed:  None Criminal Activity/Legal Involvement Pertinent to Current Situation/Hospitalization:  No - Comment as needed Significant Relationships:  Adult Children, Spouse, Other Family Members Lives with:  Spouse Do you feel safe going back to the place where you live?  No Need for family participation in patient care:  No (Coment)  Care giving concerns:  Pt with new impairment and PT is recommending SNF at discharge.  Pt agreeable to SNF.  Social Worker assessment / plan:  CSW discussed recommendation for SNF and the SNF process and placement. CSW explained her role. Pt confirmed that she resides at home with spouse and adult children. Pt was independent prior to hospitalizations and hopes to return home after rehabilitation. Pt has no experience with SNF. CSW obtained verbal permission to send to SNF's in [employer] CSW explained the [employer] process. No issues or concerns identified.  Employment status:  Retired Forensic scientist:    PT Recommendations:  [place] / Referral to community resources:  [place]  Patient/Family's Response to care:  Patient thanked CSW for meeting to discuss SNF process. CSW will provide list of SNF's once offers made. No issues or concerns identified.  Patient/Family's Understanding of and  Emotional Response to Diagnosis, Current Treatment, and Prognosis:  Pt has good understanding of impairment as she agrees to SNF. Pt hopes to improve with rehabilitation and get home soon. CSW will f/u for SNF's and assist with disposition process. No issues or concerns at this time.   Emotional Assessment Appearance:  Appears stated age Attitude/Demeanor/Rapport:  (Cooperative) Affect (typically observed):  Accepting, Appropriate Orientation:  Oriented to Self, Oriented to Place, Oriented to  Time, Oriented to Situation Alcohol / Substance use:  Not Applicable Psych involvement (Current and /or in the community):  No (Comment)  Discharge Needs  Concerns to be addressed:  Discharge Planning Concerns Readmission within the last 30 days:  No Current discharge risk:  Dependent with Mobility, Physical Impairment Barriers to Discharge:  No Barriers Identified   Candace Baxter, LCSW 03/31/2017, 1:11 PM

## 2017-03-31 NOTE — Plan of Care (Signed)

## 2017-03-31 NOTE — NC FL2 (Signed)
[place] LEVEL OF CARE SCREENING TOOL     IDENTIFICATION  Patient Name: Candace Newman Birthdate: 11-13-1940 Sex: female Admission Date (Current Location): 03/28/2017  [facility] and [place] Number:  Herbalist and Address:  [facility]. [facility], [place] [address], [place], [place] [address]      Provider Number: [id number]  Attending Physician Name and Address:  Debbe Odea, MD  Relative Name and Phone Number:  Pansie Guggisberg, spouse, [phone]    Current Level of Care: Hospital Recommended Level of Care: [place] Prior Approval Number:    Date Approved/Denied:   PASRR Number: [id number]  Discharge Plan: SNF    Current Diagnoses: Patient Active Problem List   Diagnosis Date Noted  . Leukocytosis 03/28/2017  . Closed intertrochanteric fracture of right hip ([place]) 03/28/2017  . Tobacco abuse 03/28/2017  . Weakness of right leg 09/06/2016  . Low back pain 09/06/2016  . Right hip pain 09/06/2016  . History of colonic polyps 11/27/2015  . Routine general medical examination at a health care facility 05/30/2015  . Estrogen deficiency 05/30/2015  . Encounter for Medicare annual wellness exam 01/19/2013  . Personal history of colonic polyps 01/19/2013  . Osteopenia 07/29/2011  . Hyponatremia 02/19/2011  . POSTMENOPAUSAL STATUS 04/14/2007  . Diabetes type 2, controlled ([place]) 03/12/2007  . Hyperlipidemia 03/12/2007  . GLAUCOMA 03/12/2007  . Essential hypertension 03/12/2007    Orientation RESPIRATION BLADDER Height & Weight     Self, Time, Situation, Place  Normal Continent Weight: 105 lb 13.1 oz (48 kg) Height:  5\' 2"  (157.5 cm)  BEHAVIORAL SYMPTOMS/MOOD NEUROLOGICAL BOWEL NUTRITION STATUS      Continent Diet(See DC Summary)  AMBULATORY STATUS COMMUNICATION OF NEEDS Skin   Extensive Assist Verbally Surgical wounds                       Personal Care Assistance Level of Assistance  Dressing, Bathing,  Feeding Bathing Assistance: Limited assistance Feeding assistance: Independent Dressing Assistance: Limited assistance     Functional Limitations Info  Sight, Hearing, Speech Sight Info: Adequate Hearing Info: Adequate Speech Info: Adequate    SPECIAL CARE FACTORS FREQUENCY  PT (By licensed PT), OT (By licensed OT)     PT Frequency: 5x week OT Frequency: 5x week            Contractures      Additional Factors Info  Code Status, Allergies, Insulin Sliding Scale Code Status Info: Full Allergies Info: No Known Allergies   Insulin Sliding Scale Info: Insulin daily       Current Medications (03/31/2017):  This is the current hospital active medication list Current Facility-Administered Medications  Medication Dose Route Frequency Provider Last Rate Last Dose  . acetaminophen (TYLENOL) tablet 1,000 mg  1,000 mg Oral Q6H Ophelia Charter T, MD   1,000 mg at 03/31/17 1311  . atenolol (TENORMIN) tablet 25 mg  25 mg Oral Daily Debbe Odea, MD   25 mg at 03/31/17 0825  . atorvastatin (LIPITOR) tablet 20 mg  20 mg Oral Daily Ophelia Charter T, MD   20 mg at 03/31/17 0825  . docusate sodium (COLACE) capsule 100 mg  100 mg Oral BID Ophelia Charter T, MD   100 mg at 03/31/17 7673  . enoxaparin (LOVENOX) injection 40 mg  40 mg Subcutaneous Q24H Ophelia Charter T, MD   40 mg at 03/31/17 0826  . hydrALAZINE (APRESOLINE) injection 10 mg  10 mg Intravenous Q6H  PRN Hiram Gash, MD   10 mg at 03/31/17 0511  . insulin aspart (novoLOG) injection 0-9 Units  0-9 Units Subcutaneous TID WC Debbe Odea, MD   2 Units at 03/31/17 1250  . latanoprost (XALATAN) 0.005 % ophthalmic solution 1 drop  1 drop Both Eyes QHS Hiram Gash, MD   1 drop at 03/30/17 2111  . menthol-cetylpyridinium (CEPACOL) lozenge 3 mg  1 lozenge Oral PRN Hiram Gash, MD       Or  . phenol (CHLORASEPTIC) mouth spray 1 spray  1 spray Mouth/Throat PRN Hiram Gash, MD      . methocarbamol (ROBAXIN) tablet 500 mg  500 mg Oral Q6H PRN  Ophelia Charter T, MD   500 mg at 03/30/17 1702   Or  . methocarbamol (ROBAXIN) 500 mg in dextrose 5 % 50 mL IVPB  500 mg Intravenous Q6H PRN Hiram Gash, MD      . metoCLOPramide (REGLAN) tablet 5-10 mg  5-10 mg Oral Q8H PRN Hiram Gash, MD       Or  . metoCLOPramide (REGLAN) injection 5-10 mg  5-10 mg Intravenous Q8H PRN Ophelia Charter T, MD      . nicotine (NICODERM CQ - dosed in mg/24 hr) patch 7 mg  7 mg Transdermal Daily Debbe Odea, MD   7 mg at 03/31/17 0826  . ondansetron (ZOFRAN) tablet 4 mg  4 mg Oral Q6H PRN Hiram Gash, MD       Or  . ondansetron [facility]) injection 4 mg  4 mg Intravenous Q6H PRN Hiram Gash, MD   4 mg at 03/31/17 1311  . sodium chloride 0.9 % bolus 1,000 mL  1,000 mL Intravenous Once Debbe Odea, MD   1,000 mL at 03/31/17 1130  . timolol (TIMOPTIC) 0.5 % ophthalmic solution 1 drop  1 drop Both Eyes Daily Hiram Gash, MD   1 drop at 03/31/17 1610     Discharge Medications: Please see discharge summary for a list of discharge medications.  Relevant Imaging Results:  Relevant Lab Results:   Additional Information SS#: [place], LCSW

## 2017-03-31 NOTE — Social Work (Signed)
CSW met with patient at bedside and provided her list of SNF offers. Pt indicated that she would like to discuss with family, however, they are at work now. She indicated she will let CSW know her choice in the morning. CSW offered to call family and patient declined and will discuss with them later this evening. CSW reiterated that the SNF will need to obtain Insurance Auth for placement. Pt voiced understanding of same.  CSW will f/u.  Elissa Hefty, LCSW Clinical Social Worker [phone]

## 2017-03-31 NOTE — Progress Notes (Signed)
ORTHOPAEDIC PROGRESS NOTE  s/p Procedure(s): INTRAMEDULLARY (IM) NAIL INTERTROCHANTRIC  SUBJECTIVE: No other complaints.  OBJECTIVE: PE: RLE: incision CDI, leg lengths equal, warm well perfused foot, intact EHL/TA/GSC   Vitals:   03/31/17 0017 03/31/17 0426  BP: 137/63 (!) 173/81  Pulse: 76 75  Resp:  17  Temp:  98.8 F (37.1 C)  SpO2:  94%     ASSESSMENT: Candace Newman is a 77 y.o. female doing well postoperatively.  PLAN: Weightbearing: WBAT RLE Insicional and dressing care: OK to remove dressings POD3 and leave open to air with dry gauze PRN Orthopedic device(s): None Showering: prn after dressing down VTE prophylaxis: Lovenox 40mg  qd x4 wk and transition to asa Pain control: prn meds Follow - up plan: 2 weeks Contact information:  Weekdays 8-5 Ophelia Charter MD [phone], After hours and holidays please check [email or web address] for group call information for [facility]

## 2017-03-31 NOTE — Progress Notes (Signed)
PROGRESS NOTE    Candace Newman   EZM:[id number]  DOB: 1940-05-01  DOA: 03/28/2017 PCP: Abner Greenspan, MD   Brief Narrative:  Candace Newman is a 77 y.o. female with  HTN, DM2, tobacco abuse, HLD, osteopenia who presents after a mechanical fall and is found to have a right hip fracture.  Subjective: No complaints today.  ROS: no complaints of nausea, vomiting, constipation diarrhea, cough, dyspnea or dysuria. No other complaints.   Assessment & Plan:   Principal Problem:   Closed intertrochanteric fracture of right hip -  Cephalomedullary nail Intertrochanteric femur fracture- Dr Griffin Basil - f/u recommendations by ortho - celebrex ordered by ortho- d/c due to rise in Cr  Vomiting x 2 - hyponatremia- AKI - at dinner and at breakfast today- d/c Oxycodone, times of which correlate with her vomiting - patient is OK with using Tylenol for now - sodium dropped from 129- 126- will give NS bolus of 1 L and cont IV infusion at 75 cc/hr - Cr improved from 1.14 to 0.72 today - repeat Bmet later today to check sodium  Anemia due to acute blood loss  - due to volume loss? Have started NS at 75 cc/hr- see above about vomiting and IVF - Hb 14.2 on admission (2/22) - now 8.2- follow CBC tomorrow     Diabetes type 2, controlled   - A1c 6.0 - cont SSI in hospital- takes Metformin at home    Hyperlipidemia - cont statin    Essential hypertension - cont atenolol    Tobacco abuse - nicotine patch- counseled  - probable COPD-need outpt PFTs  Leukocytosis - cough mentioned is getting better- CXR clear- no fevers - no dysuria or frequency of micturition - WBC imploved from 18 on admission to 12.1 to 10- possibly was a stress response  DVT prophylaxis: Lovenox Code Status: Full code Family Communication: Disposition Plan: f/u PT eval Consultants:   ortho Procedures:   Right hip Cephalomedullary nail  Antimicrobials:  Anti-infectives (From admission, onward)   Start      Dose/Rate Route Frequency Ordered Stop   03/29/17 1345  ceFAZolin (ANCEF) IVPB 2g/100 mL premix     2 g 200 mL/hr over 30 Minutes Intravenous Every 6 hours 03/29/17 1104 03/29/17 2123   03/29/17 0730  ceFAZolin (ANCEF) IVPB 2g/100 mL premix     2 g 200 mL/hr over 30 Minutes Intravenous On call to O.R. 03/29/17 0710 03/29/17 0755   03/29/17 0730  ceFAZolin (ANCEF) IVPB 2g/100 mL premix     2 g 200 mL/hr over 30 Minutes Intravenous On call to O.R. 03/29/17 0712 03/30/17 0559   03/29/17 0716  ceFAZolin (ANCEF) 2-4 GM/100ML-% IVPB    Note to Pharmacy:  Henrine Screws   : cabinet override      03/29/17 0716 03/29/17 0745       Objective: Vitals:   03/30/17 2036 03/31/17 0017 03/31/17 0426 03/31/17 1300  BP: (!) 175/75 137/63 (!) 173/81 (!) 167/78  Pulse: 70 76 75 84  Resp:   17 17  Temp: 98.4 F (36.9 C)  98.8 F (37.1 C) 98 F (36.7 C)  TempSrc: Oral  Oral Oral  SpO2: 94%  94% 98%  Weight:      Height:        Intake/Output Summary (Last 24 hours) at 03/31/2017 1323 Last data filed at 03/31/2017 0426 Gross per 24 hour  Intake 240 ml  Output 200 ml  Net 40 ml   [employer]  03/28/17 2341  Weight: 48 kg (105 lb 13.1 oz)    Examination: General exam: Appears comfortable  HEENT: PERRLA, oral mucosa moist, no sclera icterus or thrush Respiratory system: Clear to auscultation. Respiratory effort normal. Cardiovascular system: S1 & S2 heard, RRR.   Gastrointestinal system: Abdomen soft, non-tender, nondistended. Normal bowel sound. No organomegaly Central nervous system: Alert and oriented. No focal neurological deficits. Extremities: No cyanosis, clubbing or edema- dressing on right thigh noted. Skin: No rashes or ulcers Psychiatry:  Mood & affect appropriate.     Data Reviewed: I have personally reviewed following labs and imaging studies  CBC: Recent Labs  Lab 03/28/17 1619 03/29/17 0943 03/30/17 0715 03/31/17 0607  WBC 18.5* 18.3* 12.1* 10.0  NEUTROABS 16.7*   --   --   --   HGB 14.2 12.5 9.2* 8.2*  HCT 43.1 37.5 27.8* 24.0*  MCV 90.7 90.6 89.4 87.0  PLT 245 206 154 798   Basic Metabolic Panel: Recent Labs  Lab 03/28/17 1619 03/29/17 0423 03/30/17 0715 03/31/17 0607  NA 136 134* 129* 126*  K 3.5 4.1 4.6 4.2  CL 97* 97* 98* 97*  CO2 27 28 23  20*  GLUCOSE 154* 145* 150* 133*  BUN 11 13 26* 18  CREATININE 0.79 0.88 1.14* 0.72  CALCIUM 9.5 9.3 8.6* 8.1*   GFR: Estimated Creatinine Clearance: 44.6 mL/min (by C-G formula based on SCr of 0.72 mg/dL). Liver Function Tests: Recent Labs  Lab 03/29/17 0423  ALBUMIN 3.9   No results for input(s): LIPASE, AMYLASE in the last 168 hours. No results for input(s): AMMONIA in the last 168 hours. Coagulation Profile: Recent Labs  Lab 03/28/17 1619  INR 1.12   Cardiac Enzymes: No results for input(s): CKTOTAL, CKMB, CKMBINDEX, TROPONINI in the last 168 hours. BNP (last 3 results) No results for input(s): PROBNP in the last 8760 hours. HbA1C: Recent Labs    03/29/17 0423  HGBA1C 6.0*   CBG: Recent Labs  Lab 03/30/17 2055 03/31/17 0012 03/31/17 0418 03/31/17 0838 03/31/17 1141  GLUCAP 173* 161* 136* 224* 184*   Lipid Profile: No results for input(s): CHOL, HDL, LDLCALC, TRIG, CHOLHDL, LDLDIRECT in the last 72 hours. Thyroid Function Tests: No results for input(s): TSH, T4TOTAL, FREET4, T3FREE, THYROIDAB in the last 72 hours. Anemia Panel: No results for input(s): VITAMINB12, FOLATE, FERRITIN, TIBC, IRON, RETICCTPCT in the last 72 hours. Urine analysis: No results found for: COLORURINE, APPEARANCEUR, LABSPEC, PHURINE, GLUCOSEU, HGBUR, BILIRUBINUR, KETONESUR, PROTEINUR, UROBILINOGEN, NITRITE, LEUKOCYTESUR Sepsis Labs: @LABRCNTIP (procalcitonin:4,lacticidven:4) ) Recent Results (from the past 240 hour(s))  Surgical pcr screen     Status: None   Collection Time: 03/29/17  6:20 AM  Result Value Ref Range Status   MRSA, PCR NEGATIVE NEGATIVE Final   Staphylococcus aureus  NEGATIVE NEGATIVE Final    Comment: (NOTE) The Xpert SA Assay (FDA approved for NASAL specimens in patients 36 years of age and older), is one component of a comprehensive surveillance program. It is not intended to diagnose infection nor to guide or monitor treatment. Performed at [facility], [place] [address] [place], [place] [address]          Radiology Studies: No results found.    Scheduled Meds: . acetaminophen  1,000 mg Oral Q6H  . atenolol  25 mg Oral Daily  . atorvastatin  20 mg Oral Daily  . docusate sodium  100 mg Oral BID  . enoxaparin (LOVENOX) injection  40 mg Subcutaneous Q24H  . insulin aspart  0-9 Units  Subcutaneous TID WC  . latanoprost  1 drop Both Eyes QHS  . nicotine  7 mg Transdermal Daily  . sodium chloride  1,000 mL Intravenous Once  . timolol  1 drop Both Eyes Daily   Continuous Infusions: . methocarbamol (ROBAXIN)  IV       LOS: 3 days    Time spent in minutes: 35    Debbe Odea, MD [facility] Pager: [email or web address] Password [email or web address] 03/31/2017, 1:23 PM

## 2017-04-01 ENCOUNTER — Encounter (HOSPITAL_COMMUNITY): Payer: Self-pay | Admitting: Orthopaedic Surgery

## 2017-04-01 ENCOUNTER — Inpatient Hospital Stay (HOSPITAL_COMMUNITY): Payer: Medicare Other

## 2017-04-01 DIAGNOSIS — N390 Urinary tract infection, site not specified: Secondary | ICD-10-CM

## 2017-04-01 LAB — CBC
HCT: 21.1 % — ABNORMAL LOW (ref 36.0–46.0)
HEMOGLOBIN: 7.3 g/dL — AB (ref 12.0–15.0)
MCH: 30 pg (ref 26.0–34.0)
MCHC: 34.6 g/dL (ref 30.0–36.0)
MCV: 86.8 fL (ref 78.0–100.0)
Platelets: 190 10*3/uL (ref 150–400)
RBC: 2.43 MIL/uL — ABNORMAL LOW (ref 3.87–5.11)
RDW: 12.7 % (ref 11.5–15.5)
WBC: 10 10*3/uL (ref 4.0–10.5)

## 2017-04-01 LAB — URINALYSIS, ROUTINE W REFLEX MICROSCOPIC
Bilirubin Urine: NEGATIVE
Ketones, ur: NEGATIVE mg/dL
NITRITE: NEGATIVE
Protein, ur: NEGATIVE mg/dL
SPECIFIC GRAVITY, URINE: 1.021 (ref 1.005–1.030)
pH: 5 (ref 5.0–8.0)

## 2017-04-01 LAB — ABO/RH: ABO/RH(D): O POS

## 2017-04-01 LAB — GLUCOSE, CAPILLARY
GLUCOSE-CAPILLARY: 244 mg/dL — AB (ref 65–99)
GLUCOSE-CAPILLARY: 166 mg/dL — AB (ref 65–99)
Glucose-Capillary: 159 mg/dL — ABNORMAL HIGH (ref 65–99)
Glucose-Capillary: 143 mg/dL — ABNORMAL HIGH (ref 65–99)

## 2017-04-01 LAB — HEMOGLOBIN AND HEMATOCRIT, BLOOD
HCT: 26 % — ABNORMAL LOW (ref 36.0–46.0)
Hemoglobin: 8.9 g/dL — ABNORMAL LOW (ref 12.0–15.0)

## 2017-04-01 LAB — BASIC METABOLIC PANEL
Anion gap: 8 (ref 5–15)
BUN: 16 mg/dL (ref 6–20)
CALCIUM: 8 mg/dL — AB (ref 8.9–10.3)
CO2: 22 mmol/L (ref 22–32)
CREATININE: 0.67 mg/dL (ref 0.44–1.00)
Chloride: 98 mmol/L — ABNORMAL LOW (ref 101–111)
Glucose, Bld: 157 mg/dL — ABNORMAL HIGH (ref 65–99)
Potassium: 4.5 mmol/L (ref 3.5–5.1)
SODIUM: 128 mmol/L — AB (ref 135–145)

## 2017-04-01 LAB — OSMOLALITY, URINE: Osmolality, Ur: 576 mOsm/kg (ref 300–900)

## 2017-04-01 LAB — SODIUM, URINE, RANDOM: Sodium, Ur: 26 mmol/L

## 2017-04-01 LAB — OSMOLALITY: Osmolality: 273 mOsm/kg — ABNORMAL LOW (ref 275–295)

## 2017-04-01 LAB — PREPARE RBC (CROSSMATCH)

## 2017-04-01 MED ORDER — SODIUM CHLORIDE 0.9 % IV SOLN
Freq: Once | INTRAVENOUS | Status: AC
  Administered 2017-04-01: 16:00:00 via INTRAVENOUS

## 2017-04-01 MED ORDER — FUROSEMIDE 10 MG/ML IJ SOLN
20.0000 mg | Freq: Two times a day (BID) | INTRAMUSCULAR | Status: AC
  Administered 2017-04-01 (×2): 20 mg via INTRAVENOUS
  Filled 2017-04-01 (×2): qty 2

## 2017-04-01 NOTE — Care Management Important Message (Signed)
Important Message  Patient Details  Name: Candace Newman MRN: [id number] Date of Birth: 10-07-1940   Medicare Important Message Given:  Yes    Orbie Pyo 04/01/2017, 1:59 PM

## 2017-04-01 NOTE — Social Work (Addendum)
CSW spoke with patient this morning and she confirmed SNF bed offer of [facility]  CSW f/u with SNF as they will need to obtain insurance Auth for SNF placement.  9:54am: SNF confirmed that they have Insurance Auth for placement.  CSW f/u for disposition.  Elissa Hefty, LCSW Clinical Social Worker [phone]

## 2017-04-01 NOTE — Plan of Care (Signed)
  Problem: Education: Goal: Knowledge of General Education information will improve Outcome: Progressing   Problem: Health Behavior/Discharge Planning: Goal: Ability to manage health-related needs will improve Outcome: Progressing   Problem: Clinical Measurements: Goal: Ability to maintain clinical measurements within normal limits will improve Outcome: Progressing Goal: Will remain free from infection Outcome: Progressing Goal: Diagnostic test results will improve Outcome: Progressing Goal: Respiratory complications will improve Outcome: Progressing Goal: Cardiovascular complication will be avoided Outcome: Progressing   Problem: Activity: Goal: Risk for activity intolerance will decrease Outcome: Progressing   Problem: Nutrition: Goal: Adequate nutrition will be maintained Outcome: Progressing   Problem: Coping: Goal: Level of anxiety will decrease Outcome: Progressing   Problem: Elimination: Goal: Will not experience complications related to bowel motility Outcome: Progressing Goal: Will not experience complications related to urinary retention Outcome: Progressing   Problem: Safety: Goal: Ability to remain free from injury will improve Outcome: Progressing   Problem: Skin Integrity: Goal: Risk for impaired skin integrity will decrease Outcome: Progressing   

## 2017-04-01 NOTE — Progress Notes (Addendum)
PROGRESS NOTE    Candace Newman   ZOX:[id number]  DOB: 01/04/1941  DOA: 03/28/2017 PCP: Abner Greenspan, MD   Brief Narrative:  Candace Newman is a 77 y.o. female with  HTN, DM2, tobacco abuse, HLD, osteopenia who presents after a mechanical fall and is found to have a right hip fracture.  Subjective: States vomiting has stopped after narcotics were discontinued. Tolerating solid food. She is not aware of urinating at all. The nurse states that he night staff got her up the the commode a couple of times (but output not documented)- patient does not remember getting up.  - I dicussed that she cannot be discharged today due to low sodium and low Hb ROS: no complaints of nausea, vomiting, constipation diarrhea, cough, dyspnea or dysuria. No other complaints.   Assessment & Plan:   Principal Problem:   Closed intertrochanteric fracture of right hip - 3/23 -  Cephalomedullary nail Intertrochanteric femur fracture- Dr Griffin Basil - celebrex ordered by ortho- d/c'd due to rise in Cr -  recommendations by ortho: WBAT RLE, OK to remove dressings POD3 and leave open to air with dry gauze PRN, Showering prn after dressing down, Lovenox 40mg  qd x4 wk and transition to asa Follow - up plan: 2 weeks - not needing any pain meds now - last seen yesterday by ortho  Vomiting x 2 - hyponatremia- AKI - 2/25- vomiting at dinner and at breakfast today- d/c Oxycodone (3/25), times of which correlate with her vomiting - patient is OK with using Tylenol for now  - sodium dropped from 129- 126- will give NS bolus of 1 L and cont IV infusion at 75 cc/hr - Cr improved from 1.14 to 0.72 today - repeat Bmet later today to check sodium - 3/26: sodium still only 128, I and O not being documented despite order- - have asked for nephrology input on hyponatremia- not volume overloaded- likely SIADH  Anemia due to acute blood loss  - due to volume loss? Have started NS at 75 cc/hr- see above about vomiting and  IVF - Hb 14.2 on admission (2/22) - now 7.3- will order 1 u PRBC- I discussed this with the patient this AM.    Pyuria - no fever, leukocytosis resolved since admission, incontinence or frequency- no need to treat     Diabetes type 2, controlled   - A1c 6.0 - cont SSI in hospital- takes Metformin at home    Hyperlipidemia - cont statin    Essential hypertension - cont atenolol    Tobacco abuse - nicotine patch- counseled  - probable COPD-needs outpt PFTs  Leukocytosis on admission but resolved by 3/25 - cough at home is getting better- CXR clear- no fevers - she did nor report dysuria or frequency of micturition - WBC imploved from 18 on admission to 12.1 to 10- possibly was a stress response  DVT prophylaxis: Lovenox Code Status: Full code Family Communication: Disposition Plan: f/u PT eval Consultants:   ortho Procedures:   Right hip Cephalomedullary nail  Antimicrobials:  Anti-infectives (From admission, onward)   Start     Dose/Rate Route Frequency Ordered Stop   03/29/17 1345  ceFAZolin (ANCEF) IVPB 2g/100 mL premix     2 g 200 mL/hr over 30 Minutes Intravenous Every 6 hours 03/29/17 1104 03/29/17 2123   03/29/17 0730  ceFAZolin (ANCEF) IVPB 2g/100 mL premix     2 g 200 mL/hr over 30 Minutes Intravenous On call to O.R. 03/29/17 0710 03/29/17 8119  03/29/17 0730  ceFAZolin (ANCEF) IVPB 2g/100 mL premix     2 g 200 mL/hr over 30 Minutes Intravenous On call to O.R. 03/29/17 3474 03/30/17 0559   03/29/17 0716  ceFAZolin (ANCEF) 2-4 GM/100ML-% IVPB    Note to Pharmacy:  Henrine Screws   : cabinet override      03/29/17 0716 03/29/17 0745       Objective: Vitals:   03/31/17 1900 04/01/17 0420 04/01/17 0711 04/01/17 0944  BP: (!) 145/67 (!) 173/82 123/63 138/70  Pulse: 95 (!) 101  (!) 103  Resp: 14 16    Temp: 98 F (36.7 C) 98 F (36.7 C)  98.4 F (36.9 C)  TempSrc: Axillary Oral  Oral  SpO2: 97% 98%  98%  Weight:      Height:        Intake/Output  Summary (Last 24 hours) at 04/01/2017 1017 Last data filed at 03/31/2017 1645 Gross per 24 hour  Intake 480 ml  Output -  Net 480 ml   Filed Weights   03/28/17 2341  Weight: 48 kg (105 lb 13.1 oz)    Examination: General exam: Appears comfortable  HEENT: PERRLA, oral mucosa moist, no sclera icterus or thrush Respiratory system: Clear to auscultation. Respiratory effort normal. Cardiovascular system: S1 & S2 heard, RRR.   Gastrointestinal system: Abdomen soft, non-tender, nondistended. Normal bowel sound. No organomegaly Central nervous system: Alert and oriented. No focal neurological deficits. Extremities: No cyanosis, clubbing or edema- dressing on right thigh noted. Skin: No rashes or ulcers Psychiatry:  Mood & affect appropriate.     Data Reviewed: I have personally reviewed following labs and imaging studies  CBC: Recent Labs  Lab 03/28/17 1619 03/29/17 0943 03/30/17 0715 03/31/17 0607 04/01/17 0600  WBC 18.5* 18.3* 12.1* 10.0 10.0  NEUTROABS 16.7*  --   --   --   --   HGB 14.2 12.5 9.2* 8.2* 7.3*  HCT 43.1 37.5 27.8* 24.0* 21.1*  MCV 90.7 90.6 89.4 87.0 86.8  PLT 245 206 154 169 259   Basic Metabolic Panel: Recent Labs  Lab 03/29/17 0423 03/30/17 0715 03/31/17 0607 03/31/17 1426 04/01/17 0600  NA 134* 129* 126* 129* 128*  K 4.1 4.6 4.2 4.4 4.5  CL 97* 98* 97* 99* 98*  CO2 28 23 20* 23 22  GLUCOSE 145* 150* 133* 151* 157*  BUN 13 26* 18 16 16   CREATININE 0.88 1.14* 0.72 0.74 0.67  CALCIUM 9.3 8.6* 8.1* 7.9* 8.0*   GFR: Estimated Creatinine Clearance: 44.6 mL/min (by C-G formula based on SCr of 0.67 mg/dL). Liver Function Tests: Recent Labs  Lab 03/29/17 0423  ALBUMIN 3.9   No results for input(s): LIPASE, AMYLASE in the last 168 hours. No results for input(s): AMMONIA in the last 168 hours. Coagulation Profile: Recent Labs  Lab 03/28/17 1619  INR 1.12   Cardiac Enzymes: No results for input(s): CKTOTAL, CKMB, CKMBINDEX, TROPONINI in the  last 168 hours. BNP (last 3 results) No results for input(s): PROBNP in the last 8760 hours. HbA1C: No results for input(s): HGBA1C in the last 72 hours. CBG: Recent Labs  Lab 03/31/17 0418 03/31/17 0838 03/31/17 1141 03/31/17 1631 03/31/17 2125  GLUCAP 136* 224* 184* 144* 166*   Lipid Profile: No results for input(s): CHOL, HDL, LDLCALC, TRIG, CHOLHDL, LDLDIRECT in the last 72 hours. Thyroid Function Tests: No results for input(s): TSH, T4TOTAL, FREET4, T3FREE, THYROIDAB in the last 72 hours. Anemia Panel: No results for input(s): VITAMINB12, FOLATE, FERRITIN, TIBC, IRON,  RETICCTPCT in the last 72 hours. Urine analysis: No results found for: COLORURINE, APPEARANCEUR, LABSPEC, PHURINE, GLUCOSEU, HGBUR, BILIRUBINUR, KETONESUR, PROTEINUR, UROBILINOGEN, NITRITE, LEUKOCYTESUR Sepsis Labs: @LABRCNTIP (procalcitonin:4,lacticidven:4) ) Recent Results (from the past 240 hour(s))  Surgical pcr screen     Status: None   Collection Time: 03/29/17  6:20 AM  Result Value Ref Range Status   MRSA, PCR NEGATIVE NEGATIVE Final   Staphylococcus aureus NEGATIVE NEGATIVE Final    Comment: (NOTE) The Xpert SA Assay (FDA approved for NASAL specimens in patients 9 years of age and older), is one component of a comprehensive surveillance program. It is not intended to diagnose infection nor to guide or monitor treatment. Performed at [facility], [place] [address], [place], [place] [address]          Radiology Studies: No results found.    Scheduled Meds: . acetaminophen  1,000 mg Oral Q6H  . atenolol  25 mg Oral Daily  . atorvastatin  20 mg Oral Daily  . docusate sodium  100 mg Oral BID  . enoxaparin (LOVENOX) injection  40 mg Subcutaneous Q24H  . insulin aspart  0-9 Units Subcutaneous TID WC  . latanoprost  1 drop Both Eyes QHS  . nicotine  7 mg Transdermal Daily  . timolol  1 drop Both Eyes Daily   Continuous Infusions: . methocarbamol (ROBAXIN)  IV       LOS: 4 days      Time spent in minutes: 35    Debbe Odea, MD [facility] Pager: [email or web address] Password [facility] 04/01/2017, 10:17 AM

## 2017-04-01 NOTE — Consult Note (Signed)
Referring Provider: No ref. provider found Primary Care Physician:  [name], [name], MD Primary Nephrologist:    Reason for Consultation:  Hyponatremia  SIADH  HPI:77 y.o.femalewith  HTN, DM2, tobacco abuse, HLD,osteopenia who presents after a mechanical fall and is found to have a right hip fracture she underwent closed intertrochanteric femur fracture. She has had some vomiting and poor appetite and she received 1 L of normal saline   The saline provoked a drop in the sodium from 129 - 126 She has been using pain medications. Judging by her fluid balance she was in a positive free water balance  Na  134 - 129 -126 - 129 - 128   No recent TSH and no recent cortisol  She appears to have no volume overload with clear lungs and no edema. Her blood pressure appears stable and there is no hypotension. She is taking celebrex that has been associated with hyponatremia and this should be stopped also no NSAIDs as these too can cause hyponatremia  CXR  No abnormality mild changes of COPD       Past Medical History:  Diagnosis Date  . Cataract    REMOVED BILATERAL  . Diabetes mellitus    type II  . Glaucoma    BILATERAL  . Hyperlipidemia   . Hypertension   . Osteopenia     Past Surgical History:  Procedure Laterality Date  . ABDOMINAL HYSTERECTOMY     ovaries intact -non cancer done for bleeding    Prior to Admission medications   Medication Sig Start Date End Date Taking? Authorizing Provider  atenolol (TENORMIN) 25 MG tablet Take 1 tablet (25 mg total) by mouth daily. 12/27/16  Yes [name], [name], MD  atorvastatin (LIPITOR) 20 MG tablet Take 1 tablet (20 mg total) by mouth daily. 06/05/16  Yes [name], Marne A, MD  latanoprost (XALATAN) 0.005 % ophthalmic solution Place 1 drop into both eyes at bedtime. Use eye drops as directed.    Yes [provider]  metFORMIN (GLUCOPHAGE) 500 MG tablet Take 1 tablet (500 mg total) by mouth daily. 06/05/16  Yes [name], [name], MD   Multiple Vitamin (MULTIVITAMIN) tablet Take 1 tablet by mouth daily.     Yes [provider]  timolol (TIMOPTIC) 0.5 % ophthalmic solution USE 1 DROP IN BOTH EYES IN THE MORNING 07/22/14  Yes [provider]  ACCU-CHEK AVIVA PLUS test strip TEST BLOOD SUGAR ONCE DAILY AND AS NEEDED (DX. E11.9) 07/02/16   [name], [name], MD  ACCU-CHEK SOFTCLIX LANCETS lancets USE DEVICE TO TEST BLOOD SUGAR ONCE DAILY. (DX E11.9) 02/25/17   [name], [name], MD  acetaminophen (TYLENOL) 500 MG tablet Take 2 tablets (1,000 mg total) by mouth every 8 (eight) hours for 14 days. 03/30/17 04/13/17  Hiram Gash, MD  enoxaparin (LOVENOX) 40 MG/0.4ML injection Inject 0.4 mLs (40 mg total) into the skin daily. 03/30/17 03/30/18  Hiram Gash, MD  ondansetron (ZOFRAN) 4 MG tablet Take 1 tablet (4 mg total) by mouth every 8 (eight) hours as needed for up to 7 days for nausea or vomiting. 03/30/17 04/06/17  Hiram Gash, MD  oxyCODONE (OXY IR/ROXICODONE) 5 MG immediate release tablet Take 1-2 pills every 6 hrs as needed for pain 03/30/17 04/04/17  Hiram Gash, MD  tiZANidine (ZANAFLEX) 4 MG tablet TAKE 1 TABLET BY MOUTH EVERY 8 HOURS AS NEEDED NO MORE THAN 3 DOSES IN 24 HOURS 01/13/17   [provider]    Current  Facility-Administered Medications  Medication Dose Route Frequency Provider Last Rate Last Dose  . acetaminophen (TYLENOL) tablet 1,000 mg  1,000 mg Oral Q6H Ophelia Charter T, MD   1,000 mg at 04/01/17 0602  . atenolol (TENORMIN) tablet 25 mg  25 mg Oral Daily Debbe Odea, MD   25 mg at 03/31/17 0825  . atorvastatin (LIPITOR) tablet 20 mg  20 mg Oral Daily Ophelia Charter T, MD   20 mg at 03/31/17 0825  . docusate sodium (COLACE) capsule 100 mg  100 mg Oral BID Ophelia Charter T, MD   100 mg at 03/31/17 2232  . enoxaparin (LOVENOX) injection 40 mg  40 mg Subcutaneous Q24H Ophelia Charter T, MD   40 mg at 03/31/17 0826  . hydrALAZINE (APRESOLINE) injection 10 mg  10 mg Intravenous Q6H PRN Hiram Gash, MD   10 mg  at 04/01/17 0604  . insulin aspart (novoLOG) injection 0-9 Units  0-9 Units Subcutaneous TID WC Debbe Odea, MD   1 Units at 03/31/17 1723  . latanoprost (XALATAN) 0.005 % ophthalmic solution 1 drop  1 drop Both Eyes QHS Hiram Gash, MD   1 drop at 03/31/17 2319  . menthol-cetylpyridinium (CEPACOL) lozenge 3 mg  1 lozenge Oral PRN Hiram Gash, MD       Or  . phenol (CHLORASEPTIC) mouth spray 1 spray  1 spray Mouth/Throat PRN Hiram Gash, MD      . methocarbamol (ROBAXIN) tablet 500 mg  500 mg Oral Q6H PRN Ophelia Charter T, MD   500 mg at 03/30/17 1702   Or  . methocarbamol (ROBAXIN) 500 mg in dextrose 5 % 50 mL IVPB  500 mg Intravenous Q6H PRN Hiram Gash, MD      . metoCLOPramide (REGLAN) tablet 5-10 mg  5-10 mg Oral Q8H PRN Hiram Gash, MD       Or  . metoCLOPramide (REGLAN) injection 5-10 mg  5-10 mg Intravenous Q8H PRN Ophelia Charter T, MD      . nicotine (NICODERM CQ - dosed in mg/24 hr) patch 7 mg  7 mg Transdermal Daily Debbe Odea, MD   7 mg at 03/31/17 0826  . ondansetron (ZOFRAN) tablet 4 mg  4 mg Oral Q6H PRN Hiram Gash, MD       Or  . ondansetron [facility]) injection 4 mg  4 mg Intravenous Q6H PRN Hiram Gash, MD   4 mg at 03/31/17 1311  . timolol (TIMOPTIC) 0.5 % ophthalmic solution 1 drop  1 drop Both Eyes Daily Hiram Gash, MD   1 drop at 03/31/17 0826    Allergies as of 03/28/2017  . (No Known Allergies)    Family History  Problem Relation Age of Onset  . Heart disease Mother        CAD  . Hypertension Mother   . Heart disease Father        CAD  . Diabetes Brother   . Colon cancer Brother   . Cancer Brother        lymph node CA    Social History   Socioeconomic History  . Marital status: Married    Spouse name: Not on file  . Number of children: Not on file  . Years of education: Not on file  . Highest education level: Not on file  Occupational History  . Not on file  Social Needs  . Financial resource strain: Not on file  . Food  insecurity:  Worry: Not on file    Inability: Not on file  . Transportation needs:    Medical: Not on file    Non-medical: Not on file  Tobacco Use  . Smoking status: Former Smoker    Types: Cigarettes    Last attempt to quit: 01/07/2005    Years since quitting: 12.2  . Smokeless tobacco: Never Used  Substance and Sexual Activity  . Alcohol use: No    Alcohol/week: 0.0 oz  . Drug use: No  . Sexual activity: Never  Lifestyle  . Physical activity:    Days per week: Not on file    Minutes per session: Not on file  . Stress: Not on file  Relationships  . Social connections:    Talks on phone: Not on file    Gets together: Not on file    Attends religious service: Not on file    Active member of club or organization: Not on file    Attends meetings of clubs or organizations: Not on file    Relationship status: Not on file  . Intimate partner violence:    Fear of current or ex partner: Not on file    Emotionally abused: Not on file    Physically abused: Not on file    Forced sexual activity: Not on file  Other Topics Concern  . Not on file  Social History Narrative  . Not on file    Review of Systems: Gen: Denies any fever, chills, sweats, anorexia, fatigue, weakness, malaise, weight loss, and sleep disorder HEENT: No visual complaints, No history of Retinopathy. Normal external appearance No Epistaxis or Sore throat. No sinusitis.   CV: Denies chest pain, angina, palpitations, syncope, orthopnea, PND, peripheral edema, and claudication. Resp: Denies dyspnea at rest, dyspnea with exercise, cough, sputum, wheezing, coughing up blood, and pleurisy. GI: Denies vomiting blood, jaundice, and fecal incontinence.   Denies dysphagia or odynophagia. GU : Denies urinary burning, blood in urine, urinary frequency, urinary hesitancy, nocturnal urination, and urinary incontinence.  No renal calculi. MS: Denies joint pain, limitation of movement, and swelling, stiffness, low back pain,  extremity pain. Denies muscle weakness, cramps, atrophy.  No use of non steroidal antiinflammatory drugs. Derm: Denies rash, itching, dry skin, hives, moles, warts, or unhealing ulcers.  Psych: Denies depression, anxiety, memory loss, suicidal ideation, hallucinations, paranoia, and confusion. Heme: Denies bruising, bleeding, and enlarged lymph nodes. Neuro: No headache.  No diplopia. No dysarthria.  No dysphasia.  No history of CVA.  No Seizures. No paresthesias.  No weakness. Endocrine No DM.  No Thyroid disease.  No Adrenal disease.  Physical Exam: Vital signs in last 24 hours: Temp:  [98 F (36.7 C)-98.4 F (36.9 C)] 98.4 F (36.9 C) (03/26 0944) Pulse Rate:  [84-103] 103 (03/26 0944) Resp:  [14-17] 16 (03/26 0420) BP: (123-173)/(63-82) 138/70 (03/26 0944) SpO2:  [97 %-98 %] 98 % (03/26 0944) Last BM Date: 03/28/17 General:   Alert,  Well-developed, well-nourished, pleasant and cooperative in NAD Head:  Normocephalic and atraumatic. Eyes:  Sclera clear, no icterus.   Conjunctiva pink. Ears:  Normal auditory acuity. Nose:  No deformity, discharge,  or lesions. Mouth:  No deformity or lesions, dentition normal. Neck:  Supple; no masses or thyromegaly. JVP not elevated Lungs:  Clear throughout to auscultation.   No wheezes, crackles, or rhonchi. No acute distress. Heart:  Regular rate and rhythm; no murmurs, clicks, rubs,  or gallops. Abdomen:  Soft, nontender and nondistended. No masses, hepatosplenomegaly or hernias noted.  Normal bowel sounds, without guarding, and without rebound.   Msk:  Symmetrical without gross deformities. Normal posture. Pulses:  No carotid, renal, femoral bruits. DP and PT symmetrical and equal Extremities:  Without clubbing or edema. Neurologic:  Alert and  oriented x4;  grossly normal neurologically. Skin:  Intact without significant lesions or rashes. Cervical Nodes:  No significant cervical adenopathy. Psych:  Alert and cooperative. Normal mood and  affect.  Intake/Output from previous day: 03/25 0701 - 03/26 0700 In: 720 [P.O.:720] Out: 200 [Urine:200] Intake/Output this shift: No intake/output data recorded.  Lab Results: Recent Labs    03/30/17 0715 03/31/17 0607 04/01/17 0600  WBC 12.1* 10.0 10.0  HGB 9.2* 8.2* 7.3*  HCT 27.8* 24.0* 21.1*  PLT 154 169 190   BMET Recent Labs    03/31/17 0607 03/31/17 1426 04/01/17 0600  NA 126* 129* 128*  K 4.2 4.4 4.5  CL 97* 99* 98*  CO2 20* 23 22  GLUCOSE 133* 151* 157*  BUN 18 16 16   CREATININE 0.72 0.74 0.67  CALCIUM 8.1* 7.9* 8.0*   LFT No results for input(s): PROT, ALBUMIN, AST, ALT, ALKPHOS, BILITOT, BILIDIR, IBILI in the last 72 hours. PT/INR No results for input(s): LABPROT, INR in the last 72 hours. Hepatitis Panel No results for input(s): HEPBSAG, HCVAB, HEPAIGM, HEPBIGM in the last 72 hours.  Studies/Results: No results found.  Assessment/Plan:  Euvolemic hyponatremia with use of celebrex  Urine studies are pending and it will be interesting to see these results support the diagnosis of SIADH. I think it reasonable to increase solute intake and decrease free water intake and see if there is a diuretic response to loop diuretic  Thank you for consult on a very interesting lady   LOS: 4 Darlena Koval,Forester W @TODAY @10 :31 AM

## 2017-04-02 DIAGNOSIS — S72141A Displaced intertrochanteric fracture of right femur, initial encounter for closed fracture: Principal | ICD-10-CM

## 2017-04-02 LAB — BASIC METABOLIC PANEL
Anion gap: 8 (ref 5–15)
BUN: 15 mg/dL (ref 6–20)
CALCIUM: 8.2 mg/dL — AB (ref 8.9–10.3)
CO2: 24 mmol/L (ref 22–32)
CREATININE: 0.72 mg/dL (ref 0.44–1.00)
Chloride: 94 mmol/L — ABNORMAL LOW (ref 101–111)
GFR calc Af Amer: >60 mL/min (ref >60)
GFR calc non Af Amer: >60 mL/min (ref >60)
GLUCOSE: 210 mg/dL — AB (ref 65–99)
Potassium: 4.1 mmol/L (ref 3.5–5.1)
Sodium: 126 mmol/L — ABNORMAL LOW (ref 135–145)

## 2017-04-02 LAB — CBC
HCT: 27.1 % — ABNORMAL LOW (ref 36.0–46.0)
Hemoglobin: 9.2 g/dL — ABNORMAL LOW (ref 12.0–15.0)
MCH: 29.6 pg (ref 26.0–34.0)
MCHC: 33.9 g/dL (ref 30.0–36.0)
MCV: 87.1 fL (ref 78.0–100.0)
PLATELETS: 238 10*3/uL (ref 150–400)
RBC: 3.11 MIL/uL — ABNORMAL LOW (ref 3.87–5.11)
RDW: 13.1 % (ref 11.5–15.5)
WBC: 12.3 10*3/uL — ABNORMAL HIGH (ref 4.0–10.5)

## 2017-04-02 LAB — TYPE AND SCREEN
ABO/RH(D): O POS
ANTIBODY SCREEN: NEGATIVE
Unit division: 0

## 2017-04-02 LAB — BPAM RBC
Blood Product Expiration Date: 201904082359
ISSUE DATE / TIME: 201903261559
Unit Type and Rh: 5100

## 2017-04-02 LAB — GLUCOSE, CAPILLARY
GLUCOSE-CAPILLARY: 146 mg/dL — AB (ref 65–99)
GLUCOSE-CAPILLARY: 190 mg/dL — AB (ref 65–99)
Glucose-Capillary: 146 mg/dL — ABNORMAL HIGH (ref 65–99)
Glucose-Capillary: 178 mg/dL — ABNORMAL HIGH (ref 65–99)

## 2017-04-02 MED ORDER — ONDANSETRON HCL 40 MG/20ML IJ SOLN
8.0000 mg | Freq: Once | INTRAMUSCULAR | Status: AC
  Administered 2017-04-02: 8 mg via INTRAVENOUS
  Filled 2017-04-02: qty 4

## 2017-04-02 NOTE — Progress Notes (Signed)
[facility] Progress Note   77 y.o.femalewith HTN, DM2, tobacco abuse, HLD,osteopenia who presents after a mechanical fall and is found to have a right hip fracture she underwent closed intertrochanteric femur fracture. She has had some vomiting and poor appetite and she received 1 L of normal saline   The saline provoked a drop in the sodium from 129 - 126 She has been using pain medications. Judging by her fluid balance she was in a positive free water balance  Na  134 - 129 -126 - 129 - 128   No recent TSH and no recent cortisol   Assessment/ Plan:    Euvolemic hyponatremia with use of celebrex  Urine studies are pending and it will be interesting to see these results support the diagnosis of SIADH.   Urine osm is much higher than serum osm which is suggestive of SIADH.  She needs to increase solute intake and decrease free water intake; I educated her on that. The hospital food is making her gag. Her husband will try to bring food from elsewhere.  Great volume response to diuretic but not to the SNa.  Will hold off on Tolvaptan as long as the Na doesn't continue to drop.  Will also give a dose of Zofran as nausea is a very strong stimulant for vasopressin release.   Subjective:   Still has some nausea. Denies f/c/cp. Denies dry mouth or dizziness.   Objective:   BP (!) 171/77 (BP Location: Left Arm)   Pulse 81   Temp 98.3 F (36.8 C) (Oral)   Resp 18   Ht 5\' 2"  (1.575 m)   Wt 48 kg (105 lb 13.1 oz)   SpO2 98%   BMI 19.35 kg/m   Intake/Output Summary (Last 24 hours) at 04/02/2017 1445 Last data filed at 04/02/2017 0830 Gross per 24 hour  Intake 820 ml  Output -  Net 820 ml   Weight change:   Physical Exam: General:   Alert, cooperative in NAD Head:  Normocephalic and atraumatic. Neck:  Supple; no masses or thyromegaly. JVP not elevated Lungs:  Clear throughout to auscultation.    Heart:  Regular rate and rhythm; no murmurs, clicks,  rubs,  or gallops. Abdomen:  Soft, nontender and nondistended. No masses, hepatosplenomegaly or hernias noted. Normal bowel sounds, without guarding, and without rebound.   Extremities:  Without clubbing or edema. Neurologic:  Alert and  oriented x4;  grossly normal neurologically. Skin:  Intact without significant lesions or rashes.    Imaging: Dg Chest Port 1 View  Result Date: 04/01/2017 CLINICAL DATA:  Abnormal breath sounds. History of hypertension and diabetes. EXAM: PORTABLE CHEST 1 VIEW COMPARISON:  03/29/2017. FINDINGS: Mediastinum and hilar structures normal. Lungs are clear of acute infiltrates. Tiny calcified pulmonary nodules most likely granulomas. Stable from prior exam. No pleural effusion or pneumothorax. Heart size normal. No acute bony abnormality. Thoracic spine scoliosis and degenerative change. IMPRESSION: No acute cardiopulmonary disease. Electronically Signed   By: Marcello Moores  Register   On: 04/01/2017 11:00    Labs: BMET Recent Labs  Lab 03/28/17 1619 03/29/17 0423 03/30/17 0715 03/31/17 0607 03/31/17 1426 04/01/17 0600 04/02/17 1152  NA 136 134* 129* 126* 129* 128* 126*  K 3.5 4.1 4.6 4.2 4.4 4.5 4.1  CL 97* 97* 98* 97* 99* 98* 94*  CO2 27 28 23  20* 23 22 24   GLUCOSE 154* 145* 150* 133* 151* 157* 210*  BUN 11 13 26* 18 16 16 15   CREATININE 0.79 0.88  1.14* 0.72 0.74 0.67 0.72  CALCIUM 9.5 9.3 8.6* 8.1* 7.9* 8.0* 8.2*   CBC Recent Labs  Lab 03/28/17 1619  03/30/17 0715 03/31/17 0607 04/01/17 0600 04/01/17 1924 04/02/17 1152  WBC 18.5*   < > 12.1* 10.0 10.0  --  12.3*  NEUTROABS 16.7*  --   --   --   --   --   --   HGB 14.2   < > 9.2* 8.2* 7.3* 8.9* 9.2*  HCT 43.1   < > 27.8* 24.0* 21.1* 26.0* 27.1*  MCV 90.7   < > 89.4 87.0 86.8  --  87.1  PLT 245   < > 154 169 190  --  238   < > = values in this interval not displayed.    Medications:    . atenolol  25 mg Oral Daily  . atorvastatin  20 mg Oral Daily  . docusate sodium  100 mg Oral BID  .  enoxaparin (LOVENOX) injection  40 mg Subcutaneous Q24H  . insulin aspart  0-9 Units Subcutaneous TID WC  . latanoprost  1 drop Both Eyes QHS  . nicotine  7 mg Transdermal Daily  . timolol  1 drop Both Eyes Daily      Otelia Santee, MD 04/02/2017, 2:45 PM

## 2017-04-02 NOTE — Social Work (Signed)
Patient has insurance auth for SNF placement at [facility].  CSW f/u for disposition.  Elissa Hefty, LCSW Clinical Social Worker [phone]

## 2017-04-02 NOTE — Progress Notes (Signed)
,                                                                                   Patient Demographics:    Candace Newman, is a 77 y.o. female, DOB - 07-28-1940, VOZ:[id number]  Admit date - 03/28/2017   Admitting Physician Toy Baker, MD  Outpatient Primary MD for the patient is [name], Candace Fanny, MD  LOS - 5   Chief Complaint  Patient presents with  . Fall  . Hip Pain    RIGHT HIP        Subjective:    [name] today has no fevers, No chest pain, nausea but no vomiting  Assessment  & Plan :    Principal Problem:   Closed intertrochanteric fracture of right hip ([place]) Active Problems:   Diabetes type 2, controlled ([place])   Hyperlipidemia   Essential hypertension   Leukocytosis   Tobacco abuse  Brief Narrative:  Candace Newman is a 77 y.o.femalewith  HTN, DM2, tobacco abuse, HLD,osteopenia who presents after a mechanical fall and is found to have a right hip fracture.  Subjective: Sodium remains low, voiding well, some nausea, no vomiting,   Assessment & Plan:     1) Closed intertrochanteric fracture of right hip - 03/29/17 - Cephalomedullary nail Intertrochanteric femur fracture- Dr Griffin Basil - celebrex ordered by ortho- d/c'd due to rise in Cr -  recommendations by ortho: WBATRLE, OK to remove dressings POD3and leave open to air with dry gauze PRN, Showering prn after dressing down, Lovenox 40mg  qdx4 wk and transition to asa Follow - up plan: 2 weeks - not needing any pain meds now - last seen yesterday by ortho  2)hyponatremia-  acute kidney injury-no further emesis, nephrology input appreciated, per nephrologist hold off on tolvaptan for now sodium is down to 126   3)Anemia due to acute blood loss -hemoglobin is stabilized,  Hb 14.2 on admission (2/22) -hemoglobin is now 9.2 from 7.3- after transfusion of  1 u PRBC  4)  Diabetes type 2, controlled  - - A1c 6.0, - cont SSI in hospital- takes Metformin at home  5)HTN-stable, continue  atenolol  6)Tobacco abuse-  nicotine patch- counseled  - probable COPD-needs outpt PFTs  7)Leukocytosis on admission but resolved by 3/25 - cough at home is getting better- CXR clear- no fevers - she did nor report dysuria or frequency of micturition - WBC imploved from 18 on admission to 12.1 to 10- possibly was a stress response  DVT prophylaxis: Lovenox Code Status: Full code Family Communication: Disposition Plan: f/u PT eval Consultants:   ortho Procedures:   Right hip Cephalomedullary nail     Lab Results  Component Value Date   PLT 238 04/02/2017    Inpatient Medications  Scheduled Meds: . atenolol  25 mg Oral Daily  . atorvastatin  20 mg Oral Daily  . docusate sodium  100 mg Oral BID  . enoxaparin (LOVENOX) injection  40 mg Subcutaneous Q24H  . insulin aspart  0-9 Units Subcutaneous TID WC  . latanoprost  1 drop Both Eyes QHS  . nicotine  7 mg Transdermal Daily  . timolol  1 drop Both  Eyes Daily   Continuous Infusions: . methocarbamol (ROBAXIN)  IV     PRN Meds:.hydrALAZINE, menthol-cetylpyridinium **OR** phenol, methocarbamol **OR** methocarbamol (ROBAXIN)  IV, metoCLOPramide **OR** metoCLOPramide (REGLAN) injection, ondansetron **OR** ondansetron (ZOFRAN) IV    Anti-infectives (From admission, onward)   Start     Dose/Rate Route Frequency Ordered Stop   03/29/17 1345  ceFAZolin (ANCEF) IVPB 2g/100 mL premix     2 g 200 mL/hr over 30 Minutes Intravenous Every 6 hours 03/29/17 1104 03/29/17 2123   03/29/17 0730  ceFAZolin (ANCEF) IVPB 2g/100 mL premix     2 g 200 mL/hr over 30 Minutes Intravenous On call to O.R. 03/29/17 0710 03/29/17 0755   03/29/17 0730  ceFAZolin (ANCEF) IVPB 2g/100 mL premix     2 g 200 mL/hr over 30 Minutes Intravenous On call to O.R. 03/29/17 2706 03/30/17 0559   03/29/17 0716  ceFAZolin (ANCEF) 2-4 GM/100ML-% IVPB    Note to Pharmacy:  Henrine Screws   : cabinet override      03/29/17 0716 03/29/17 0745        Objective:     Vitals:   04/01/17 1754 04/01/17 1900 04/02/17 0548 04/02/17 1400  BP: (!) 156/72 (!) 147/60 (!) 171/77 (!) 158/59  Pulse: 67 69 81 67  Resp: 18 18  18   Temp: 98.3 F (36.8 C) 98.4 F (36.9 C) 98.3 F (36.8 C) 99 F (37.2 C)  TempSrc: Oral Oral Oral Oral  SpO2: 100% 99% 98% 100%  Weight:      Height:        Wt Readings from Last 3 Encounters:  03/28/17 48 kg (105 lb 13.1 oz)  09/06/16 52.2 kg (115 lb)  06/19/16 52.8 kg (116 lb 8 oz)     Intake/Output Summary (Last 24 hours) at 04/02/2017 1801 Last data filed at 04/02/2017 1230 Gross per 24 hour  Intake 290 ml  Output -  Net 290 ml     Physical Exam  Gen:- Awake Alert,  In no apparent distress  HEENT:- [place].AT, No sclera icterus Neck-Supple Neck,No JVD,.  Lungs-  CTAB  CV- S1, S2 normal Abd-  +ve B.Sounds, Abd Soft, No tenderness,    Extremity/Skin:-Good pulses, right hip wound   clean dry and intact Psych-affect is appropriate, oriented x3 Neuro-no new focal deficits, no tremors   Data Review:   Micro Results Recent Results (from the past 240 hour(s))  Surgical pcr screen     Status: None   Collection Time: 03/29/17  6:20 AM  Result Value Ref Range Status   MRSA, PCR NEGATIVE NEGATIVE Final   Staphylococcus aureus NEGATIVE NEGATIVE Final    Comment: (NOTE) The Xpert SA Assay (FDA approved for NASAL specimens in patients 64 years of age and older), is one component of a comprehensive surveillance program. It is not intended to diagnose infection nor to guide or monitor treatment. Performed at [facility], [place] [address] [place],  [address]     Radiology Reports Dg Lumbar Spine Complete  Result Date: 03/28/2017 CLINICAL DATA:  Fall with pelvic pain EXAM: LUMBAR SPINE - COMPLETE 4+ VIEW COMPARISON:  MRI 09/16/2016 FINDINGS: Five non rib-bearing lumbar type vertebra. Grade 1 anterolisthesis of L5 on S1. Moderate degenerative changes at L5-S1. Vertebral body heights are maintained. Mild  degenerative changes at L1-L2 and L2-L3. Aortic atherosclerosis. IMPRESSION: Grade 1 anterolisthesis of L5 on S1, similar compared to 2018 MRI. No definite acute osseous abnormality. Electronically Signed   By: Madie Reno.D.  On: 03/28/2017 17:37   Dg Elbow Complete Right  Result Date: 03/28/2017 CLINICAL DATA:  Fall with elbow pain EXAM: RIGHT ELBOW - COMPLETE 3+ VIEW COMPARISON:  None. FINDINGS: There is no evidence of fracture, dislocation, or joint effusion. There is no evidence of arthropathy or other focal bone abnormality. Soft tissues are unremarkable. IMPRESSION: Negative. Electronically Signed   By: Donavan Foil M.D.   On: 03/28/2017 17:40   Dg Ankle Complete Right  Result Date: 03/28/2017 CLINICAL DATA:  Fall with pain EXAM: RIGHT ANKLE - COMPLETE 3+ VIEW COMPARISON:  None. FINDINGS: No acute displaced fracture or malalignment is seen. Ankle mortise is symmetric. Mild degenerative changes. IMPRESSION: No acute osseous abnormality Electronically Signed   By: Donavan Foil M.D.   On: 03/28/2017 17:40   Dg Chest Port 1 View  Result Date: 04/01/2017 CLINICAL DATA:  Abnormal breath sounds. History of hypertension and diabetes. EXAM: PORTABLE CHEST 1 VIEW COMPARISON:  03/29/2017. FINDINGS: Mediastinum and hilar structures normal. Lungs are clear of acute infiltrates. Tiny calcified pulmonary nodules most likely granulomas. Stable from prior exam. No pleural effusion or pneumothorax. Heart size normal. No acute bony abnormality. Thoracic spine scoliosis and degenerative change. IMPRESSION: No acute cardiopulmonary disease. Electronically Signed   By: Marcello Moores  Register   On: 04/01/2017 11:00   Chest Portable 1 View  Result Date: 03/29/2017 CLINICAL DATA:  Preoperative evaluation. EXAM: PORTABLE CHEST 1 VIEW COMPARISON:  None. FINDINGS: Normal sized heart. Clear lungs. The lungs are hyperexpanded with mild diffuse peribronchial thickening. Mild scoliosis. Diffuse osteopenia. IMPRESSION: No  acute abnormality.  Mild changes of COPD and chronic bronchitis. Electronically Signed   By: Claudie Revering M.D.   On: 03/29/2017 07:58   Dg Knee Complete 4 Views Right  Result Date: 03/28/2017 CLINICAL DATA:  Fall EXAM: RIGHT KNEE - COMPLETE 4+ VIEW COMPARISON:  07/01/2011 FINDINGS: No acute displaced fracture or malalignment. Mild patellofemoral degenerative changes with spurring. Trace knee effusion. Mild to moderate degenerative changes at the lateral compartment. Mild degenerative changes at the medial compartment. IMPRESSION: Degenerative changes.  No acute osseous abnormality. Electronically Signed   By: Donavan Foil M.D.   On: 03/28/2017 17:39   Dg C-arm 1-60 Min  Result Date: 03/29/2017 CLINICAL DATA:  Right intertrochanteric fracture fixation. EXAM: RIGHT FEMUR 2 VIEWS; DG C-ARM 61-120 MIN COMPARISON:  Right hip x-rays from yesterday. FINDINGS: Multiple intraoperative x-rays demonstrate interval cephalomedullary rod fixation of the right intertrochanteric femur fracture. Alignment is anatomic. No acute abnormality. IMPRESSION: Right intertrochanteric femur fracture cephalomedullary rod fixation in anatomic alignment. FLUOROSCOPY TIME:  1 minutes, 34 seconds. C-arm fluoroscopic images were obtained intraoperatively and submitted for post operative interpretation. Electronically Signed   By: Titus Dubin M.D.   On: 03/29/2017 10:24   Dg Hip Unilat With Pelvis 2-3 Views Right  Result Date: 03/28/2017 CLINICAL DATA:  Fall right hip pain EXAM: DG HIP (WITH OR WITHOUT PELVIS) 2-3V RIGHT COMPARISON:  09/06/2016 FINDINGS: Pubic symphysis and rami are intact. Right femoral head projects in joint. Acute mildly displaced intertrochanteric fracture. IMPRESSION: Acute mildly displaced right intertrochanteric fracture. Electronically Signed   By: Donavan Foil M.D.   On: 03/28/2017 17:33   Dg Femur, Min 2 Views Right  Result Date: 03/29/2017 CLINICAL DATA:  Right intertrochanteric fracture fixation.  EXAM: RIGHT FEMUR 2 VIEWS; DG C-ARM 61-120 MIN COMPARISON:  Right hip x-rays from yesterday. FINDINGS: Multiple intraoperative x-rays demonstrate interval cephalomedullary rod fixation of the right intertrochanteric femur fracture. Alignment is anatomic. No acute abnormality. IMPRESSION: Right  intertrochanteric femur fracture cephalomedullary rod fixation in anatomic alignment. FLUOROSCOPY TIME:  1 minutes, 34 seconds. C-arm fluoroscopic images were obtained intraoperatively and submitted for post operative interpretation. Electronically Signed   By: Titus Dubin M.D.   On: 03/29/2017 10:24   Dg Femur Port, Min 2 Views Right  Result Date: 03/29/2017 CLINICAL DATA:  Postop check RIGHT femoral nailing EXAM: RIGHT FEMUR PORTABLE 2 VIEW COMPARISON:  Intraoperative images 03/26/2017 FINDINGS: IM nail with proximal compression screw and distal locking screw identified within RIGHT femur post ORIF of a reduced intertrochanteric fracture of the RIGHT femur. Bones appear demineralized. RIGHT hip joint space preserved without dislocation. Degenerative changes RIGHT knee. IMPRESSION: Post ORIF of an intertrochanteric fracture of the RIGHT femur. Electronically Signed   By: Lavonia Dana M.D.   On: 03/29/2017 10:25     CBC Recent Labs  Lab 03/28/17 1619 03/29/17 0943 03/30/17 0715 03/31/17 0607 04/01/17 0600 04/01/17 1924 04/02/17 1152  WBC 18.5* 18.3* 12.1* 10.0 10.0  --  12.3*  HGB 14.2 12.5 9.2* 8.2* 7.3* 8.9* 9.2*  HCT 43.1 37.5 27.8* 24.0* 21.1* 26.0* 27.1*  PLT 245 206 154 169 190  --  238  MCV 90.7 90.6 89.4 87.0 86.8  --  87.1  MCH 29.9 30.2 29.6 29.7 30.0  --  29.6  MCHC 32.9 33.3 33.1 34.2 34.6  --  33.9  RDW 12.5 12.6 12.6 12.6 12.7  --  13.1  LYMPHSABS 0.9  --   --   --   --   --   --   MONOABS 0.8  --   --   --   --   --   --   EOSABS 0.1  --   --   --   --   --   --   BASOSABS 0.0  --   --   --   --   --   --     Chemistries  Recent Labs  Lab 03/30/17 0715 03/31/17 0607  03/31/17 1426 04/01/17 0600 04/02/17 1152  NA 129* 126* 129* 128* 126*  K 4.6 4.2 4.4 4.5 4.1  CL 98* 97* 99* 98* 94*  CO2 23 20* 23 22 24   GLUCOSE 150* 133* 151* 157* 210*  BUN 26* 18 16 16 15   CREATININE 1.14* 0.72 0.74 0.67 0.72  CALCIUM 8.6* 8.1* 7.9* 8.0* 8.2*   ------------------------------------------------------------------------------------------------------------------ No results for input(s): CHOL, HDL, LDLCALC, TRIG, CHOLHDL, LDLDIRECT in the last 72 hours.  Lab Results  Component Value Date   HGBA1C 6.0 (H) 03/29/2017   ------------------------------------------------------------------------------------------------------------------ No results for input(s): TSH, T4TOTAL, T3FREE, THYROIDAB in the last 72 hours.  Invalid input(s): FREET3 ------------------------------------------------------------------------------------------------------------------ No results for input(s): VITAMINB12, FOLATE, FERRITIN, TIBC, IRON, RETICCTPCT in the last 72 hours.  Coagulation profile Recent Labs  Lab 03/28/17 1619  INR 1.12    No results for input(s): DDIMER in the last 72 hours.  Cardiac Enzymes No results for input(s): CKMB, TROPONINI, MYOGLOBIN in the last 168 hours.  Invalid input(s): CK ------------------------------------------------------------------------------------------------------------------ No results found for: BNP   Roxan Hockey M.D on 04/02/2017 at 6:01 PM  Between 7am to 7pm - Pager - [phone]  After 7pm go to [email or web address] - password [email or web address]  [facility] -  Office  [phone]   Voice Recognition Viviann Spare dictation system was used to create this note, attempts have been made to correct errors. Please contact the author with questions and/or clarifications.

## 2017-04-02 NOTE — Progress Notes (Signed)
Physical Therapy Treatment Patient Details Name: Candace Newman MRN: [id number] DOB: [birth year] Today's Date: 04/02/2017    History of Present Illness Pt is a 77 y.o. female with medical history significant of HTN, DM2, tobacco abuse, HLD, and osteopenia. She presented s/p fall with R hip fx and underwent R hip IM nailing 03-29-17.     PT Comments    Patient is making progress toward mobility goals and tolerated session well. Pt continues to demonstrate generalized weakness and R LE instability with weight bearing. Pt reported that she has no appetite and that she has not been eating very much. Continue to progress as tolerated with anticipated d/c to SNF for further skilled PT services.     Follow Up Recommendations  SNF;Supervision/Assistance - 24 hour     Equipment Recommendations  Rolling walker with 5" wheels    Recommendations for Other Services       Precautions / Restrictions Precautions Precautions: Fall Restrictions Weight Bearing Restrictions: Yes LLE Weight Bearing: Weight bearing as tolerated    Mobility  Bed Mobility               General bed mobility comments: pt OOB in chair upon arrival  Transfers Overall transfer level: Needs assistance Equipment used: Rolling walker (2 wheeled) Transfers: Sit to/from [facility] Sit to Stand: Min assist Stand pivot transfers: Min assist       General transfer comment: cues for safe hand placement; assist to steady; transfers from recliner and [facility]  Ambulation/Gait Ambulation/Gait assistance: Min assist;Mod assist;+2 safety/equipment(chair follow) Ambulation Distance (Feet): 50 Feet Assistive device: Rolling walker (2 wheeled) Gait Pattern/deviations: Step-to pattern;Step-through pattern;Decreased stance time - right;Decreased step length - left;Antalgic;Narrow base of support;Trunk flexed     General Gait Details: cues for posture, sequencing, and safe proximity to RW; pt required assist for  balance and managing RW; R knee buckling at times especially with fatigue   Stairs            Wheelchair Mobility    Modified Rankin (Stroke Patients Only)       Balance Overall balance assessment: Needs assistance Sitting-balance support: No upper extremity supported;Feet supported Sitting balance-Leahy Scale: Good     Standing balance support: Bilateral upper extremity supported;During functional activity Standing balance-Leahy Scale: Poor Standing balance comment: Reliant on RW and therapist                            Cognition Arousal/Alertness: Awake/alert Behavior During Therapy: WFL for tasks assessed/performed Overall Cognitive Status: Within Functional Limits for tasks assessed                                        Exercises General Exercises - Lower Extremity Ankle Circles/Pumps: AROM;Both;15 reps Quad Sets: AROM;Both;10 reps Long Arc Quad: AROM;Right;10 reps;Seated Heel Slides: AAROM;Right;10 reps Hip ABduction/ADduction: AAROM;Right;10 reps    General Comments        Pertinent Vitals/Pain Pain Assessment: Faces Faces Pain Scale: Hurts a little bit Pain Location: R hip Pain Descriptors / Indicators: Discomfort Pain Intervention(s): Monitored during session;Premedicated before session;Repositioned;Ice applied    Home Living                      Prior Function            PT Goals (current goals can now be found in  the care plan section) Acute Rehab PT Goals Patient Stated Goal: home PT Goal Formulation: With patient/family Time For Goal Achievement: 04/13/17 Potential to Achieve Goals: Good Progress towards PT goals: Progressing toward goals    Frequency    Min 3X/week      PT Plan Current plan remains appropriate    Co-evaluation              AM-PAC PT "6 Clicks" Daily Activity  Outcome Measure  Difficulty turning over in bed (including adjusting bedclothes, sheets and blankets)?: A  Little Difficulty moving from lying on back to sitting on the side of the bed? : A Lot Difficulty sitting down on and standing up from a chair with arms (e.g., wheelchair, bedside commode, etc,.)?: Unable Help needed moving to and from a bed to chair (including a wheelchair)?: A Little Help needed walking in hospital room?: A Lot Help needed climbing 3-5 steps with a railing? : A Lot 6 Click Score: 13    End of Session Equipment Utilized During Treatment: Gait belt Activity Tolerance: Patient tolerated treatment well Patient left: in chair;with call bell/phone within reach Nurse Communication: Mobility status;Other (comment)(use gait belt when transferring pt ) PT Visit Diagnosis: Other abnormalities of gait and mobility (R26.89);Difficulty in walking, not elsewhere classified (R26.2);Pain Pain - Right/Left: Right Pain - part of body: Hip     Time: 1208-1232 PT Time Calculation (min) (ACUTE ONLY): 24 min  Charges:  $Gait Training: 8-22 mins $Therapeutic Exercise: 8-22 mins                    G Codes:       Earney Navy, PTA Pager: [phone]     Darliss Cheney 04/02/2017, 1:27 PM

## 2017-04-03 LAB — CBC
HEMATOCRIT: 24.8 % — AB (ref 36.0–46.0)
HEMOGLOBIN: 8.5 g/dL — AB (ref 12.0–15.0)
MCH: 30.5 pg (ref 26.0–34.0)
MCHC: 34.3 g/dL (ref 30.0–36.0)
MCV: 88.9 fL (ref 78.0–100.0)
Platelets: 238 10*3/uL (ref 150–400)
RBC: 2.79 MIL/uL — ABNORMAL LOW (ref 3.87–5.11)
RDW: 13.6 % (ref 11.5–15.5)
WBC: 10.8 10*3/uL — ABNORMAL HIGH (ref 4.0–10.5)

## 2017-04-03 LAB — BASIC METABOLIC PANEL
Anion gap: 9 (ref 5–15)
BUN: 14 mg/dL (ref 6–20)
CALCIUM: 8.2 mg/dL — AB (ref 8.9–10.3)
CO2: 24 mmol/L (ref 22–32)
Chloride: 96 mmol/L — ABNORMAL LOW (ref 101–111)
Creatinine, Ser: 0.72 mg/dL (ref 0.44–1.00)
GFR calc Af Amer: >60 mL/min (ref >60)
GFR calc non Af Amer: >60 mL/min (ref >60)
GLUCOSE: 158 mg/dL — AB (ref 65–99)
Potassium: 4.2 mmol/L (ref 3.5–5.1)
Sodium: 129 mmol/L — ABNORMAL LOW (ref 135–145)

## 2017-04-03 LAB — GLUCOSE, CAPILLARY
Glucose-Capillary: 148 mg/dL — ABNORMAL HIGH (ref 65–99)
Glucose-Capillary: 169 mg/dL — ABNORMAL HIGH (ref 65–99)
Glucose-Capillary: 146 mg/dL — ABNORMAL HIGH (ref 65–99)
Glucose-Capillary: 131 mg/dL — ABNORMAL HIGH (ref 65–99)

## 2017-04-03 NOTE — Progress Notes (Signed)
[facility] Progress Note   77 y.o.femalewith HTN, DM2, tobacco abuse, HLD,osteopenia who presents after a mechanical fall and is found to have a right hip fracture she underwent closed intertrochanteric femur fracture. She has had some vomiting and poor appetite and she received 1 L of normal saline w/ a drop in Na.   Assessment/ Plan:    Euvolemic hyponatremia with use of celebrex Urine studies are pending and it will be interesting to see these results support the diagnosis of SIADH.  ? Urine osm is much higher than serum osm which is suggestive of SIADH. ? She needs to increase solute intake and decrease free water intake; I educated her on that. The hospital food is making her gag. Her husband will try to bring food from elsewhere. Today the husband is eating the same hospital food as the pt and she is making a good attempt at consuming it. ? I gave a dose of Zofran 3/27 and this may be needed again as nausea is a very strong stimulant for vasopressin release.     Subjective:   Still has some nausea but improving. Denies f/c/cp. Denies dry mouth or dizziness.   Objective:   BP (!) 167/82 (BP Location: Left Arm)   Pulse 80   Temp 98.7 F (37.1 C) (Oral)   Resp 19   Ht 5\' 2"  (1.575 m)   Wt 48 kg (105 lb 13.1 oz)   SpO2 96%   BMI 19.35 kg/m   Intake/Output Summary (Last 24 hours) at 04/03/2017 1256 Last data filed at 04/03/2017 0830 Gross per 24 hour  Intake 240 ml  Output -  Net 240 ml   Weight change:   Physical Exam: General: Alert, cooperative in NAD Head: Normocephalic and atraumatic. Neck: Supple; no masses or thyromegaly. JVP not elevated Lungs: Clear throughout to auscultation.  Heart: Regular rate and rhythm; no murmurs, clicks, rubs, or gallops. Abdomen: Soft, nontender and nondistended. No masses, hepatosplenomegaly or hernias noted. Normal bowel sounds, without guarding, and without rebound.  Extremities: Without clubbing  or edema. Neurologic: Alert and oriented x4; grossly normal neurologically. Skin: Intact without significant lesions or rashes.    Imaging: No results found.  Labs: BMET Recent Labs  Lab 03/29/17 0423 03/30/17 0715 03/31/17 0607 03/31/17 1426 04/01/17 0600 04/02/17 1152 04/03/17 0520  NA 134* 129* 126* 129* 128* 126* 129*  K 4.1 4.6 4.2 4.4 4.5 4.1 4.2  CL 97* 98* 97* 99* 98* 94* 96*  CO2 28 23 20* 23 22 24 24   GLUCOSE 145* 150* 133* 151* 157* 210* 158*  BUN 13 26* 18 16 16 15 14   CREATININE 0.88 1.14* 0.72 0.74 0.67 0.72 0.72  CALCIUM 9.3 8.6* 8.1* 7.9* 8.0* 8.2* 8.2*   CBC Recent Labs  Lab 03/28/17 1619  03/31/17 0607 04/01/17 0600 04/01/17 1924 04/02/17 1152 04/03/17 0520  WBC 18.5*   < > 10.0 10.0  --  12.3* 10.8*  NEUTROABS 16.7*  --   --   --   --   --   --   HGB 14.2   < > 8.2* 7.3* 8.9* 9.2* 8.5*  HCT 43.1   < > 24.0* 21.1* 26.0* 27.1* 24.8*  MCV 90.7   < > 87.0 86.8  --  87.1 88.9  PLT 245   < > 169 190  --  238 238   < > = values in this interval not displayed.    Medications:    . atenolol  25 mg Oral  Daily  . atorvastatin  20 mg Oral Daily  . docusate sodium  100 mg Oral BID  . enoxaparin (LOVENOX) injection  40 mg Subcutaneous Q24H  . insulin aspart  0-9 Units Subcutaneous TID WC  . latanoprost  1 drop Both Eyes QHS  . nicotine  7 mg Transdermal Daily  . timolol  1 drop Both Eyes Daily      Otelia Santee, MD 04/03/2017, 12:56 PM

## 2017-04-03 NOTE — Progress Notes (Signed)
,                                                                                   Patient Demographics:    Candace Newman, is a 77 y.o. female, DOB - 1940/04/09, VHQ:[id number]  Admit date - 03/28/2017   Admitting Physician Toy Baker, MD  Outpatient Primary MD for the patient is [name], Candace Fanny, MD  LOS - 6   Chief Complaint  Patient presents with  . Fall  . Hip Pain    RIGHT HIP        Subjective:    Candace Newman today has no fevers, No chest pain, nausea but no vomiting, eating better , trying to be compliant with free water restriction  Assessment  & Plan :    Principal Problem:   Closed intertrochanteric fracture of right hip ([place]) Active Problems:   Diabetes type 2, controlled ([place])   Hyperlipidemia   Essential hypertension   Leukocytosis   Tobacco abuse  Brief Narrative:  Candace Newman is a 77 y.o.femalewith  HTN, DM2, tobacco abuse, HLD,osteopenia who presents after a mechanical fall and is found to have a right hip fracture.  Subjective: Patient is trying to be compliant with free water restriction, no vomiting, no diarrhea, no chest pains no palpitations no dizziness   Assessment & Plan:     1) Closed intertrochanteric fracture of right hip - 03/29/17 - Cephalomedullary nail Intertrochanteric femur fracture- Dr Griffin Basil - celebrex ordered by ortho- d/c'd due to rise in Cr -  recommendations by ortho: WBATRLE, OK to remove dressings POD3and leave open to air with dry gauze PRN, Showering prn after dressing down, Lovenox 40mg  qdx4 wk and transition to asa Follow - up plan: 2 weeks - not needing any pain meds now   2)hyponatremia-  acute kidney injury-no further emesis, nephrology input appreciated, per nephrologist hold off on tolvaptan for now,  sodium is down up to  129, the need to be compliant with free water restriction and to increase salute intake emphasized to patient, Discharge to skilled nursing facility if serum sodium  stable  3)Anemia due to acute blood loss -hemoglobin is stabilized,  Hb 14.2 on admission (2/22) -hemoglobin is now 8.5 from 7.3- after transfusion of  1 u PRBC  4)  Diabetes type 2, controlled  - - A1c 6.0, - cont SSI in hospital- takes Metformin at home  5)HTN-stable, continue atenolol  6)Tobacco abuse-  nicotine patch- counseled , - probable COPD-needs outpt PFTs  7)Leukocytosis on admission --white count is down to 10.8 from 18.5 on admission,   cough at home is getting better- CXR clear- no fevers, suspected   reactive leukocytosis    DVT prophylaxis: Lovenox Code Status: Full code Family Communication: Disposition Plan:  Discharge to skilled nursing facility if serum sodium stable Consultants:   ortho Procedures:   Right hip Cephalomedullary nail     Lab Results  Component Value Date   PLT 238 04/03/2017    Inpatient Medications  Scheduled Meds: . atenolol  25 mg Oral Daily  . atorvastatin  20 mg Oral Daily  . docusate sodium  100 mg Oral BID  . enoxaparin (  LOVENOX) injection  40 mg Subcutaneous Q24H  . insulin aspart  0-9 Units Subcutaneous TID WC  . latanoprost  1 drop Both Eyes QHS  . nicotine  7 mg Transdermal Daily  . timolol  1 drop Both Eyes Daily   Continuous Infusions: . methocarbamol (ROBAXIN)  IV     PRN Meds:.hydrALAZINE, menthol-cetylpyridinium **OR** phenol, methocarbamol **OR** methocarbamol (ROBAXIN)  IV, metoCLOPramide **OR** metoCLOPramide (REGLAN) injection, ondansetron **OR** ondansetron (ZOFRAN) IV    Anti-infectives (From admission, onward)   Start     Dose/Rate Route Frequency Ordered Stop   03/29/17 1345  ceFAZolin (ANCEF) IVPB 2g/100 mL premix     2 g 200 mL/hr over 30 Minutes Intravenous Every 6 hours 03/29/17 1104 03/29/17 2123   03/29/17 0730  ceFAZolin (ANCEF) IVPB 2g/100 mL premix     2 g 200 mL/hr over 30 Minutes Intravenous On call to O.R. 03/29/17 0710 03/29/17 0755   03/29/17 0730  ceFAZolin (ANCEF) IVPB 2g/100 mL  premix     2 g 200 mL/hr over 30 Minutes Intravenous On call to O.R. 03/29/17 0712 03/30/17 0559   03/29/17 0716  ceFAZolin (ANCEF) 2-4 GM/100ML-% IVPB    Note to Pharmacy:  Henrine Screws   : cabinet override      03/29/17 0716 03/29/17 0745        Objective:   Vitals:   04/02/17 1400 04/02/17 2120 04/03/17 0437 04/03/17 1457  BP: (!) 158/59 (!) 143/74 (!) 167/82 (!) 148/81  Pulse: 67 78 80 72  Resp: 18 18 19 18   Temp: 99 F (37.2 C) 98.6 F (37 C) 98.7 F (37.1 C) 99.1 F (37.3 C)  TempSrc: Oral Oral Oral Oral  SpO2: 100% 96% 96% 100%  Weight:      Height:        Wt Readings from Last 3 Encounters:  03/28/17 48 kg (105 lb 13.1 oz)  09/06/16 52.2 kg (115 lb)  06/19/16 52.8 kg (116 lb 8 oz)     Intake/Output Summary (Last 24 hours) at 04/03/2017 1733 Last data filed at 04/03/2017 1230 Gross per 24 hour  Intake 360 ml  Output -  Net 360 ml     Physical Exam  Gen:- Awake Alert,  In no apparent distress  HEENT:- [place].AT, No sclera icterus Neck-Supple Neck,No JVD,.  Lungs-  CTAB  CV- S1, S2 normal Abd-  +ve B.Sounds, Abd Soft, No tenderness,    Extremity/Skin:-Good pulses, right hip wound is clean dry and intact Psych-affect is appropriate, oriented x3 Neuro-no new focal deficits, no tremors   Data Review:   Micro Results Recent Results (from the past 240 hour(s))  Surgical pcr screen     Status: None   Collection Time: 03/29/17  6:20 AM  Result Value Ref Range Status   MRSA, PCR NEGATIVE NEGATIVE Final   Staphylococcus aureus NEGATIVE NEGATIVE Final    Comment: (NOTE) The Xpert SA Assay (FDA approved for NASAL specimens in patients 57 years of age and older), is one component of a comprehensive surveillance program. It is not intended to diagnose infection nor to guide or monitor treatment. Performed at [facility], [place] [address], [place], [place] [address]     Radiology Reports Dg Lumbar Spine Complete  Result Date: 03/28/2017 CLINICAL  DATA:  Fall with pelvic pain EXAM: LUMBAR SPINE - COMPLETE 4+ VIEW COMPARISON:  MRI 09/16/2016 FINDINGS: Five non rib-bearing lumbar type vertebra. Grade 1 anterolisthesis of L5 on S1. Moderate degenerative changes at L5-S1. Vertebral body heights  are maintained. Mild degenerative changes at L1-L2 and L2-L3. Aortic atherosclerosis. IMPRESSION: Grade 1 anterolisthesis of L5 on S1, similar compared to 2018 MRI. No definite acute osseous abnormality. Electronically Signed   By: Donavan Foil M.D.   On: 03/28/2017 17:37   Dg Elbow Complete Right  Result Date: 03/28/2017 CLINICAL DATA:  Fall with elbow pain EXAM: RIGHT ELBOW - COMPLETE 3+ VIEW COMPARISON:  None. FINDINGS: There is no evidence of fracture, dislocation, or joint effusion. There is no evidence of arthropathy or other focal bone abnormality. Soft tissues are unremarkable. IMPRESSION: Negative. Electronically Signed   By: Donavan Foil M.D.   On: 03/28/2017 17:40   Dg Ankle Complete Right  Result Date: 03/28/2017 CLINICAL DATA:  Fall with pain EXAM: RIGHT ANKLE - COMPLETE 3+ VIEW COMPARISON:  None. FINDINGS: No acute displaced fracture or malalignment is seen. Ankle mortise is symmetric. Mild degenerative changes. IMPRESSION: No acute osseous abnormality Electronically Signed   By: Donavan Foil M.D.   On: 03/28/2017 17:40   Dg Chest Port 1 View  Result Date: 04/01/2017 CLINICAL DATA:  Abnormal breath sounds. History of hypertension and diabetes. EXAM: PORTABLE CHEST 1 VIEW COMPARISON:  03/29/2017. FINDINGS: Mediastinum and hilar structures normal. Lungs are clear of acute infiltrates. Tiny calcified pulmonary nodules most likely granulomas. Stable from prior exam. No pleural effusion or pneumothorax. Heart size normal. No acute bony abnormality. Thoracic spine scoliosis and degenerative change. IMPRESSION: No acute cardiopulmonary disease. Electronically Signed   By: Marcello Moores  Register   On: 04/01/2017 11:00   Chest Portable 1 View  Result  Date: 03/29/2017 CLINICAL DATA:  Preoperative evaluation. EXAM: PORTABLE CHEST 1 VIEW COMPARISON:  None. FINDINGS: Normal sized heart. Clear lungs. The lungs are hyperexpanded with mild diffuse peribronchial thickening. Mild scoliosis. Diffuse osteopenia. IMPRESSION: No acute abnormality.  Mild changes of COPD and chronic bronchitis. Electronically Signed   By: Claudie Revering M.D.   On: 03/29/2017 07:58   Dg Knee Complete 4 Views Right  Result Date: 03/28/2017 CLINICAL DATA:  Fall EXAM: RIGHT KNEE - COMPLETE 4+ VIEW COMPARISON:  07/01/2011 FINDINGS: No acute displaced fracture or malalignment. Mild patellofemoral degenerative changes with spurring. Trace knee effusion. Mild to moderate degenerative changes at the lateral compartment. Mild degenerative changes at the medial compartment. IMPRESSION: Degenerative changes.  No acute osseous abnormality. Electronically Signed   By: Donavan Foil M.D.   On: 03/28/2017 17:39   Dg C-arm 1-60 Min  Result Date: 03/29/2017 CLINICAL DATA:  Right intertrochanteric fracture fixation. EXAM: RIGHT FEMUR 2 VIEWS; DG C-ARM 61-120 MIN COMPARISON:  Right hip x-rays from yesterday. FINDINGS: Multiple intraoperative x-rays demonstrate interval cephalomedullary rod fixation of the right intertrochanteric femur fracture. Alignment is anatomic. No acute abnormality. IMPRESSION: Right intertrochanteric femur fracture cephalomedullary rod fixation in anatomic alignment. FLUOROSCOPY TIME:  1 minutes, 34 seconds. C-arm fluoroscopic images were obtained intraoperatively and submitted for post operative interpretation. Electronically Signed   By: Titus Dubin M.D.   On: 03/29/2017 10:24   Dg Hip Unilat With Pelvis 2-3 Views Right  Result Date: 03/28/2017 CLINICAL DATA:  Fall right hip pain EXAM: DG HIP (WITH OR WITHOUT PELVIS) 2-3V RIGHT COMPARISON:  09/06/2016 FINDINGS: Pubic symphysis and rami are intact. Right femoral head projects in joint. Acute mildly displaced  intertrochanteric fracture. IMPRESSION: Acute mildly displaced right intertrochanteric fracture. Electronically Signed   By: Donavan Foil M.D.   On: 03/28/2017 17:33   Dg Femur, Min 2 Views Right  Result Date: 03/29/2017 CLINICAL DATA:  Right intertrochanteric fracture  fixation. EXAM: RIGHT FEMUR 2 VIEWS; DG C-ARM 61-120 MIN COMPARISON:  Right hip x-rays from yesterday. FINDINGS: Multiple intraoperative x-rays demonstrate interval cephalomedullary rod fixation of the right intertrochanteric femur fracture. Alignment is anatomic. No acute abnormality. IMPRESSION: Right intertrochanteric femur fracture cephalomedullary rod fixation in anatomic alignment. FLUOROSCOPY TIME:  1 minutes, 34 seconds. C-arm fluoroscopic images were obtained intraoperatively and submitted for post operative interpretation. Electronically Signed   By: Titus Dubin M.D.   On: 03/29/2017 10:24   Dg Femur Port, Min 2 Views Right  Result Date: 03/29/2017 CLINICAL DATA:  Postop check RIGHT femoral nailing EXAM: RIGHT FEMUR PORTABLE 2 VIEW COMPARISON:  Intraoperative images 03/26/2017 FINDINGS: IM nail with proximal compression screw and distal locking screw identified within RIGHT femur post ORIF of a reduced intertrochanteric fracture of the RIGHT femur. Bones appear demineralized. RIGHT hip joint space preserved without dislocation. Degenerative changes RIGHT knee. IMPRESSION: Post ORIF of an intertrochanteric fracture of the RIGHT femur. Electronically Signed   By: Lavonia Dana M.D.   On: 03/29/2017 10:25     CBC Recent Labs  Lab 03/28/17 1619  03/30/17 0715 03/31/17 0607 04/01/17 0600 04/01/17 1924 04/02/17 1152 04/03/17 0520  WBC 18.5*   < > 12.1* 10.0 10.0  --  12.3* 10.8*  HGB 14.2   < > 9.2* 8.2* 7.3* 8.9* 9.2* 8.5*  HCT 43.1   < > 27.8* 24.0* 21.1* 26.0* 27.1* 24.8*  PLT 245   < > 154 169 190  --  238 238  MCV 90.7   < > 89.4 87.0 86.8  --  87.1 88.9  MCH 29.9   < > 29.6 29.7 30.0  --  29.6 30.5  MCHC 32.9    < > 33.1 34.2 34.6  --  33.9 34.3  RDW 12.5   < > 12.6 12.6 12.7  --  13.1 13.6  LYMPHSABS 0.9  --   --   --   --   --   --   --   MONOABS 0.8  --   --   --   --   --   --   --   EOSABS 0.1  --   --   --   --   --   --   --   BASOSABS 0.0  --   --   --   --   --   --   --    < > = values in this interval not displayed.    Chemistries  Recent Labs  Lab 03/31/17 0607 03/31/17 1426 04/01/17 0600 04/02/17 1152 04/03/17 0520  NA 126* 129* 128* 126* 129*  K 4.2 4.4 4.5 4.1 4.2  CL 97* 99* 98* 94* 96*  CO2 20* 23 22 24 24   GLUCOSE 133* 151* 157* 210* 158*  BUN 18 16 16 15 14   CREATININE 0.72 0.74 0.67 0.72 0.72  CALCIUM 8.1* 7.9* 8.0* 8.2* 8.2*   ------------------------------------------------------------------------------------------------------------------ No results for input(s): CHOL, HDL, LDLCALC, TRIG, CHOLHDL, LDLDIRECT in the last 72 hours.  Lab Results  Component Value Date   HGBA1C 6.0 (H) 03/29/2017   ------------------------------------------------------------------------------------------------------------------ No results for input(s): TSH, T4TOTAL, T3FREE, THYROIDAB in the last 72 hours.  Invalid input(s): FREET3 ------------------------------------------------------------------------------------------------------------------ No results for input(s): VITAMINB12, FOLATE, FERRITIN, TIBC, IRON, RETICCTPCT in the last 72 hours.  Coagulation profile Recent Labs  Lab 03/28/17 1619  INR 1.12    No results for input(s): DDIMER in the last 72 hours.  Cardiac Enzymes No results for  input(s): CKMB, TROPONINI, MYOGLOBIN in the last 168 hours.  Invalid input(s): CK ------------------------------------------------------------------------------------------------------------------ No results found for: BNP   Roxan Hockey M.D on 04/03/2017 at 5:33 PM  Between 7am to 7pm - Pager - [phone]  After 7pm go to [email or web address] - password [email or web address]  [facility] -   Office  [phone]   Voice Recognition Viviann Spare dictation system was used to create this note, attempts have been made to correct errors. Please contact the author with questions and/or clarifications.

## 2017-04-03 NOTE — Plan of Care (Signed)
  Problem: Education: Goal: Knowledge of General Education information will improve Outcome: Progressing Note:  POC reviewed with pt.; refusing pain meds.

## 2017-04-04 LAB — BASIC METABOLIC PANEL
Anion gap: 10 (ref 5–15)
BUN: 13 mg/dL (ref 6–20)
CHLORIDE: 96 mmol/L — AB (ref 101–111)
CO2: 22 mmol/L (ref 22–32)
CREATININE: 0.69 mg/dL (ref 0.44–1.00)
Calcium: 8.4 mg/dL — ABNORMAL LOW (ref 8.9–10.3)
GFR calc non Af Amer: >60 mL/min (ref >60)
Glucose, Bld: 187 mg/dL — ABNORMAL HIGH (ref 65–99)
POTASSIUM: 4.1 mmol/L (ref 3.5–5.1)
Sodium: 128 mmol/L — ABNORMAL LOW (ref 135–145)

## 2017-04-04 LAB — GLUCOSE, CAPILLARY
GLUCOSE-CAPILLARY: 184 mg/dL — AB (ref 65–99)
GLUCOSE-CAPILLARY: 164 mg/dL — AB (ref 65–99)

## 2017-04-04 MED ORDER — METHOCARBAMOL 500 MG PO TABS: 500.0000 mg | ORAL_TABLET | Freq: Three times a day (TID) | ORAL | 0 refills | Status: AC

## 2017-04-04 MED ORDER — NICOTINE 7 MG/24HR TD PT24: 7.0000 mg | MEDICATED_PATCH | Freq: Every day | TRANSDERMAL | 0 refills | Status: AC

## 2017-04-04 MED ORDER — ENOXAPARIN SODIUM 40 MG/0.4ML ~~LOC~~ SOLN: 40.0000 mg | SUBCUTANEOUS | 1 refills | Status: AC

## 2017-04-04 MED ORDER — OXYCODONE HCL 5 MG PO TABS
ORAL_TABLET | ORAL | 0 refills | Status: AC
Start: 2017-04-04 — End: 2017-04-09

## 2017-04-04 MED ORDER — POLYETHYLENE GLYCOL 3350 17 G PO PACK: 17.0000 g | PACK | Freq: Every day | ORAL | 0 refills | Status: AC

## 2017-04-04 NOTE — Discharge Instructions (Signed)
1)Limit free water to less than 400 mL/day, total fluid intake including water to less than 1 L/day 2)Activity limitations/restrictions/instructions by orthopedic team 3)Repeat BMP/basic metabolic profile on Monday, 04/07/2017 4)Follow-up with Nephrologist for further management of persistent low sodium/hyponatremia   Ophelia Charter MD, MPH [place]. [address], [address] [phone] (tel)   [phone] (fax)   POST-OPERATIVE INSTRUCTIONS   WOUND CARE  You may shower on Post-Op Day #3. Dressing can be removed and ok to shower and dry dressing can be placed as you see fit if there is any drainage.  EXERCISES Follow the instructions of your therapist.  No specific exercises necessary outside of this.  POST-OP MEDICINES A multi-modal approach will be used to treat your pain. Oxycodone - This is a strong narcotic, to be used only on an "as needed" basis for pain. Acetaminophen 500mg - A non-narcotic pain medicine.  Use 1000mg  three times a day for the first 14 days after surgery   Zofran 4mg  - This is an anti-nausea medicine to be used only if you are having nausea or vomitting.  FOLLOW-UP If you develop a Fever (>101.5), Redness or Drainage from the surgical incision site, please call our office to arrange for an evaluation. Please call the office to schedule a follow-up appointment for your suture removal, 10-14 days post-operatively.  IF YOU HAVE ANY QUESTIONS, PLEASE FEEL FREE TO CALL OUR OFFICE.  HELPFUL INFORMATION You should wean off your narcotic medicines as soon as you are able.  Most patients will be off or using minimal narcotics before their first postop appointment.   We suggest you use the pain medication the first night prior to going to bed, in order to ease any pain when the anesthesia wears off. You should avoid taking pain medications on an empty stomach as it will make you nauseous.  Do not drink alcoholic beverages or take illicit drugs  when taking pain medications.  Pain medication may make you constipated.  Below are a few solutions to try in this order: Decrease the amount of pain medication if you aren't having pain. Drink lots of decaffeinated fluids. Drink prune juice and/or each dried prunes  If the first 3 don't work start with additional solutions Take Colace - an over-the-counter stool softener Take Senokot - an over-the-counter laxative Take Miralax - a stronger over-the-counter laxative    1)Limit free water to less than 400 mL/day, total fluid intake including water to less than 1 L/day 2)Activity limitations/restrictions/instructions by orthopedic team 3)Repeat BMP/basic metabolic profile on Monday, 04/07/2017 4)Follow-up with Nephrologist for further management of persistent low sodium/hyponatremia

## 2017-04-04 NOTE — Progress Notes (Signed)
Timmothy Sours to be D/C'd Skilled nursing facility per MD order.  Discussed prescriptions and follow up appointments with the patient. Prescriptions given to patient, medication list explained in detail. Pt verbalized understanding.  Allergies as of 04/04/2017   No Known Allergies     Medication List    STOP taking these medications   tiZANidine 4 MG tablet Commonly known as:  ZANAFLEX     TAKE these medications   ACCU-CHEK AVIVA PLUS test strip Generic drug:  glucose blood TEST BLOOD SUGAR ONCE DAILY AND AS NEEDED (DX. E11.9)   ACCU-CHEK SOFTCLIX LANCETS lancets USE DEVICE TO TEST BLOOD SUGAR ONCE DAILY. (DX E11.9)   acetaminophen 500 MG tablet Commonly known as:  TYLENOL Take 2 tablets (1,000 mg total) by mouth every 8 (eight) hours for 14 days.   atenolol 25 MG tablet Commonly known as:  TENORMIN Take 1 tablet (25 mg total) by mouth daily.   atorvastatin 20 MG tablet Commonly known as:  LIPITOR Take 1 tablet (20 mg total) by mouth daily.   enoxaparin 40 MG/0.4ML injection Commonly known as:  LOVENOX Inject 0.4 mLs (40 mg total) into the skin daily. For 1 month for DVT prophylaxis   latanoprost 0.005 % ophthalmic solution Commonly known as:  XALATAN Place 1 drop into both eyes at bedtime. Use eye drops as directed.   metFORMIN 500 MG tablet Commonly known as:  GLUCOPHAGE Take 1 tablet (500 mg total) by mouth daily.   methocarbamol 500 MG tablet Commonly known as:  ROBAXIN Take 1 tablet (500 mg total) by mouth 3 (three) times daily.   multivitamin tablet Take 1 tablet by mouth daily.   nicotine 7 mg/24hr patch Commonly known as:  NICODERM CQ - dosed in mg/24 hr Place 1 patch (7 mg total) onto the skin daily. Start taking on:  04/05/2017   ondansetron 4 MG tablet Commonly known as:  ZOFRAN Take 1 tablet (4 mg total) by mouth every 8 (eight) hours as needed for up to 7 days for nausea or vomiting.   oxyCODONE 5 MG immediate release tablet Commonly known  as:  Oxy IR/ROXICODONE Take 1  tab every 6 hrs as needed for pain   polyethylene glycol packet Commonly known as:  MIRALAX Take 17 g by mouth daily.   timolol 0.5 % ophthalmic solution Commonly known as:  TIMOPTIC USE 1 DROP IN BOTH EYES IN THE MORNING       Vitals:   04/03/17 2059 04/04/17 0501  BP: 135/73 132/77  Pulse: 74 67  Resp: 18 17  Temp: 98 F (36.7 C) 98.4 F (36.9 C)  SpO2: 100% 96%    Skin clean, dry and intact without evidence of skin break down, no evidence of skin tears noted. Mepilex dressings x3 clean, dry, and intact. IV catheter discontinued intact. Site without signs and symptoms of complications. Dressing and pressure applied. Pt denies pain at this time. No complaints noted.  An After Visit Summary and prescriptions were printed and given to the [place]. Patient escorted via stretcher, and D/C to SNF via [facility].  [place] RN

## 2017-04-04 NOTE — Clinical Social Work Placement (Signed)
   CLINICAL SOCIAL WORK PLACEMENT  NOTE  Date:  04/04/2017  Patient Details  Name: Candace Newman MRN: [id number] Date of Birth: 07-04-1940  Clinical Social Work is seeking post-discharge placement for this patient at the [place] level of care (*CSW will initial, date and re-position this form in  chart as items are completed):  Yes   Patient/family provided with [facility] Work Department's list of facilities offering this level of care within the geographic area requested by the patient (or if unable, by the patient's family).  Yes   Patient/family informed of their freedom to choose among providers that offer the needed level of care, that participate in Medicare, Medicaid or managed care program needed by the patient, have an available bed and are willing to accept the patient.  Yes   Patient/family informed of [facility]'s ownership interest in [facility] and [facility], as well as of the fact that they are under no obligation to receive care at these facilities.  PASRR submitted to EDS on       PASRR number received on 03/31/17     Existing PASRR number confirmed on       FL2 transmitted to all facilities in geographic area requested by pt/family on 03/31/17     FL2 transmitted to all facilities within larger geographic area on       Patient informed that his/her managed care company has contracts with or will negotiate with certain facilities, including the following:        Yes   Patient/family informed of bed offers received.  Patient chooses bed at [name]     Physician recommends and patient chooses bed at      Patient to be transferred to [name] on 04/04/17.  Patient to be transferred to facility by [facility]     Patient family notified on 04/04/17 of transfer.  Name of family member notified:  pt responsible for self     PHYSICIAN       Additional Comment:     _______________________________________________ Normajean Baxter, LCSW 04/04/2017, 10:20 AM

## 2017-04-04 NOTE — Discharge Summary (Signed)
Candace Newman, is a 77 y.o. female  DOB [birth year]  MRN [id number].  Admission date:  03/28/2017  Admitting Physician  Toy Baker, MD  Discharge Date:  04/04/2017   Primary MD  [name], [name], MD  Recommendations for primary care physician for things to follow:   1)Limit free water to less than 400 mL/day, total fluid intake including water to less than 1 L/day 2)Activity limitations/restrictions/instructions by orthopedic team 3)Repeat BMP/basic metabolic profile on Monday, 04/07/2017 4)Follow-up with Nephrologist for further management of persistent low sodium/hyponatremia  Admission Diagnosis  Closed displaced fracture of greater trochanter of right femur, initial encounter ([place]) [S72.111A] Closed intertrochanteric fracture of hip, right, initial encounter ([place]) [S72.141A] Hip fracture ([place]) [S72.009A]   Discharge Diagnosis  Closed displaced fracture of greater trochanter of right femur, initial encounter ([place]) [S72.111A] Closed intertrochanteric fracture of hip, right, initial encounter ([place]) [S72.141A] Hip fracture ([place]) [S72.009A]   Principal Problem:   Closed intertrochanteric fracture of right hip ([place]) Active Problems:   Diabetes type 2, controlled ([place])   Hyperlipidemia   Essential hypertension   Leukocytosis   Tobacco abuse      Past Medical History:  Diagnosis Date  . Cataract    REMOVED BILATERAL  . Diabetes mellitus    type II  . Glaucoma    BILATERAL  . Hyperlipidemia   . Hypertension   . Osteopenia     Past Surgical History:  Procedure Laterality Date  . ABDOMINAL HYSTERECTOMY     ovaries intact -non cancer done for bleeding  . INTRAMEDULLARY (IM) NAIL INTERTROCHANTERIC Right 03/29/2017   Procedure: INTRAMEDULLARY (IM) NAIL INTERTROCHANTRIC;  Surgeon: Hiram Gash, MD;  Location: [facility];  Service: Orthopedics;  Laterality: Right;     HPI  from the history  and physical done on the day of admission:  HPI: Candace Newman is a 77 y.o. female with medical history significant of HTN, DM2, tobacco abuse, HLD, osteopenia    Presented with mechanical fall that occurred today she tripped over a rug while using cane to ambulate and landed on her right hip this occurred while at the eye doctor office.  On any blood thinners no LOC EMS was called patient arrived to ER Reports severe pain in her right hip unable to move the right hip area she also has hurt her elbow with some abrasions.  Otherwise no associated chest pain shortness of breath. Denies an exertional chest pain. She mops her floors and does all the cooking and laundry with out chest pain or shortness of breath. Prefers not to walk long distance for fear of falling.  She has no hx of CAD.    While in ER: Tdap updated To be hypertensive up to 187/91 felt to be secondary to pain Right lower extremity appears to be neurovascularly intact    Hospital Course:    Brief Narrative: Candace Newman a 77 y.o.femalewith HTN, DM2, tobacco abuse, HLD,osteopenia who presents after a mechanical fall and is found to have a right hip fracture.  Subjective: Patient is trying to be compliant with free water restriction, no vomiting, no diarrhea, no chest pains no palpitations no dizziness   Assessment & Plan:   1)Closed intertrochanteric fracture of right hip- -03/29/17 -Cephalomedullary nail Intertrochanteric femur fracture- Dr Griffin Basil, - celebrex ordered by ortho- d/c'ddue to rise in Cr - recommendations by ortho:WBATRLE,OK to remove dressings POD3and leave open to air with dry gauze PRN,Showering prn after dressing down,Lovenox 40mg  qdx4 wk and transition to asa Follow - up plan: 2 weeks   2)hyponatremia-  acute kidney injury-no further emesis, nephrology input appreciated, per nephrologist hold off on tolvaptan for now,  sodium is  128, the need to be compliant with  free water restriction and to increase salute intake emphasized to patient, Discharge to skilled nursing facility, repeat sodium on Monday, 04/07/2017   3)Anemia due to acute blood loss -hemoglobin is stabilized,  Hb 14.2 on admission (2/22) -hemoglobin is now 8.5 from7.3-after transfusion of  1 u PRBC  4)Diabetes type 2, controlled - - A1c 6.0, - cont SSI in hospital- takes Metformin at home  5)HTN-stable, continue atenolol  6)Tobacco abuse-  nicotine patch- counseled , - probable COPD-needsoutpt PFTs  7)Leukocytosison admission --white count is down to 10.8 from 18.5 on admission,    CXR clear- no fevers, suspected   reactive leukocytosis    DVT prophylaxis:Lovenox  Code Status:Full code Disposition Plan: Discharge to skilled nursing facility   Consultants:  ortho Procedures:  Right hip Cephalomedullary nail    Discharge Condition: stable  Follow UP  Contact information for after-discharge care    Destination    [facility] .   Service:  Skilled Nursing Contact information: [address] [place] [phone]             Diet and Activity recommendation:  As advised  Discharge Instructions    Discharge Instructions    Call MD for:  difficulty breathing, headache or visual disturbances   Complete by:  As directed    Call MD for:  persistant dizziness or light-headedness   Complete by:  As directed    Call MD for:  persistant nausea and vomiting   Complete by:  As directed    Call MD for:  temperature >100.4   Complete by:  As directed    Diet Carb Modified   Complete by:  As directed    Limit free water to less than 400 mL/day, total fluid intake including water to less than 1 L/day   Discharge instructions   Complete by:  As directed    1)Limit free water to less than 400 mL/day, total fluid intake including water to less than 1 L/day 2)Activity limitations/restrictions/instructions by  orthopedic team 3)Repeat BMP/basic metabolic profile on Monday, 04/07/2017 4)Follow-up with Nephrologist for further management of persistent low sodium/hyponatremia   Increase activity slowly   Complete by:  As directed    Activity limitations/restrictions/instructions by orthopedic team      Discharge Medications     Allergies as of 04/04/2017   No Known Allergies     Medication List    STOP taking these medications   tiZANidine 4 MG tablet Commonly known as:  ZANAFLEX     TAKE these medications   ACCU-CHEK AVIVA PLUS test strip Generic drug:  glucose blood TEST BLOOD SUGAR ONCE DAILY AND AS NEEDED (DX. E11.9)   ACCU-CHEK SOFTCLIX LANCETS lancets USE DEVICE TO TEST BLOOD SUGAR ONCE DAILY. (DX E11.9)   acetaminophen 500 MG tablet Commonly  known as:  TYLENOL Take 2 tablets (1,000 mg total) by mouth every 8 (eight) hours for 14 days.   atenolol 25 MG tablet Commonly known as:  TENORMIN Take 1 tablet (25 mg total) by mouth daily.   atorvastatin 20 MG tablet Commonly known as:  LIPITOR Take 1 tablet (20 mg total) by mouth daily.   enoxaparin 40 MG/0.4ML injection Commonly known as:  LOVENOX Inject 0.4 mLs (40 mg total) into the skin daily. For 1 month for DVT prophylaxis   latanoprost 0.005 % ophthalmic solution Commonly known as:  XALATAN Place 1 drop into both eyes at bedtime. Use eye drops as directed.   metFORMIN 500 MG tablet Commonly known as:  GLUCOPHAGE Take 1 tablet (500 mg total) by mouth daily.   methocarbamol 500 MG tablet Commonly known as:  ROBAXIN Take 1 tablet (500 mg total) by mouth 3 (three) times daily.   multivitamin tablet Take 1 tablet by mouth daily.   nicotine 7 mg/24hr patch Commonly known as:  NICODERM CQ - dosed in mg/24 hr Place 1 patch (7 mg total) onto the skin daily. Start taking on:  04/05/2017   ondansetron 4 MG tablet Commonly known as:  ZOFRAN Take 1 tablet (4 mg total) by mouth every 8 (eight) hours as needed for up to 7  days for nausea or vomiting.   oxyCODONE 5 MG immediate release tablet Commonly known as:  Oxy IR/ROXICODONE Take 1  tab every 6 hrs as needed for pain   polyethylene glycol packet Commonly known as:  MIRALAX Take 17 g by mouth daily.   timolol 0.5 % ophthalmic solution Commonly known as:  TIMOPTIC USE 1 DROP IN BOTH EYES IN THE MORNING       Major procedures and Radiology Reports - PLEASE review detailed and final reports for all details, in brief -   Dg Lumbar Spine Complete  Result Date: 03/28/2017 CLINICAL DATA:  Fall with pelvic pain EXAM: LUMBAR SPINE - COMPLETE 4+ VIEW COMPARISON:  MRI 09/16/2016 FINDINGS: Five non rib-bearing lumbar type vertebra. Grade 1 anterolisthesis of L5 on S1. Moderate degenerative changes at L5-S1. Vertebral body heights are maintained. Mild degenerative changes at L1-L2 and L2-L3. Aortic atherosclerosis. IMPRESSION: Grade 1 anterolisthesis of L5 on S1, similar compared to 2018 MRI. No definite acute osseous abnormality. Electronically Signed   By: Donavan Foil M.D.   On: 03/28/2017 17:37   Dg Elbow Complete Right  Result Date: 03/28/2017 CLINICAL DATA:  Fall with elbow pain EXAM: RIGHT ELBOW - COMPLETE 3+ VIEW COMPARISON:  None. FINDINGS: There is no evidence of fracture, dislocation, or joint effusion. There is no evidence of arthropathy or other focal bone abnormality. Soft tissues are unremarkable. IMPRESSION: Negative. Electronically Signed   By: Donavan Foil M.D.   On: 03/28/2017 17:40   Dg Ankle Complete Right  Result Date: 03/28/2017 CLINICAL DATA:  Fall with pain EXAM: RIGHT ANKLE - COMPLETE 3+ VIEW COMPARISON:  None. FINDINGS: No acute displaced fracture or malalignment is seen. Ankle mortise is symmetric. Mild degenerative changes. IMPRESSION: No acute osseous abnormality Electronically Signed   By: Donavan Foil M.D.   On: 03/28/2017 17:40   Dg Chest Port 1 View  Result Date: 04/01/2017 CLINICAL DATA:  Abnormal breath sounds. History of  hypertension and diabetes. EXAM: PORTABLE CHEST 1 VIEW COMPARISON:  03/29/2017. FINDINGS: Mediastinum and hilar structures normal. Lungs are clear of acute infiltrates. Tiny calcified pulmonary nodules most likely granulomas. Stable from prior exam. No pleural effusion or pneumothorax. Heart  size normal. No acute bony abnormality. Thoracic spine scoliosis and degenerative change. IMPRESSION: No acute cardiopulmonary disease. Electronically Signed   By: Marcello Moores  Register   On: 04/01/2017 11:00   Chest Portable 1 View  Result Date: 03/29/2017 CLINICAL DATA:  Preoperative evaluation. EXAM: PORTABLE CHEST 1 VIEW COMPARISON:  None. FINDINGS: Normal sized heart. Clear lungs. The lungs are hyperexpanded with mild diffuse peribronchial thickening. Mild scoliosis. Diffuse osteopenia. IMPRESSION: No acute abnormality.  Mild changes of COPD and chronic bronchitis. Electronically Signed   By: Claudie Revering M.D.   On: 03/29/2017 07:58   Dg Knee Complete 4 Views Right  Result Date: 03/28/2017 CLINICAL DATA:  Fall EXAM: RIGHT KNEE - COMPLETE 4+ VIEW COMPARISON:  07/01/2011 FINDINGS: No acute displaced fracture or malalignment. Mild patellofemoral degenerative changes with spurring. Trace knee effusion. Mild to moderate degenerative changes at the lateral compartment. Mild degenerative changes at the medial compartment. IMPRESSION: Degenerative changes.  No acute osseous abnormality. Electronically Signed   By: Donavan Foil M.D.   On: 03/28/2017 17:39   Dg C-arm 1-60 Min  Result Date: 03/29/2017 CLINICAL DATA:  Right intertrochanteric fracture fixation. EXAM: RIGHT FEMUR 2 VIEWS; DG C-ARM 61-120 MIN COMPARISON:  Right hip x-rays from yesterday. FINDINGS: Multiple intraoperative x-rays demonstrate interval cephalomedullary rod fixation of the right intertrochanteric femur fracture. Alignment is anatomic. No acute abnormality. IMPRESSION: Right intertrochanteric femur fracture cephalomedullary rod fixation in anatomic  alignment. FLUOROSCOPY TIME:  1 minutes, 34 seconds. C-arm fluoroscopic images were obtained intraoperatively and submitted for post operative interpretation. Electronically Signed   By: Titus Dubin M.D.   On: 03/29/2017 10:24   Dg Hip Unilat With Pelvis 2-3 Views Right  Result Date: 03/28/2017 CLINICAL DATA:  Fall right hip pain EXAM: DG HIP (WITH OR WITHOUT PELVIS) 2-3V RIGHT COMPARISON:  09/06/2016 FINDINGS: Pubic symphysis and rami are intact. Right femoral head projects in joint. Acute mildly displaced intertrochanteric fracture. IMPRESSION: Acute mildly displaced right intertrochanteric fracture. Electronically Signed   By: Donavan Foil M.D.   On: 03/28/2017 17:33   Dg Femur, Min 2 Views Right  Result Date: 03/29/2017 CLINICAL DATA:  Right intertrochanteric fracture fixation. EXAM: RIGHT FEMUR 2 VIEWS; DG C-ARM 61-120 MIN COMPARISON:  Right hip x-rays from yesterday. FINDINGS: Multiple intraoperative x-rays demonstrate interval cephalomedullary rod fixation of the right intertrochanteric femur fracture. Alignment is anatomic. No acute abnormality. IMPRESSION: Right intertrochanteric femur fracture cephalomedullary rod fixation in anatomic alignment. FLUOROSCOPY TIME:  1 minutes, 34 seconds. C-arm fluoroscopic images were obtained intraoperatively and submitted for post operative interpretation. Electronically Signed   By: Titus Dubin M.D.   On: 03/29/2017 10:24   Dg Femur Port, Min 2 Views Right  Result Date: 03/29/2017 CLINICAL DATA:  Postop check RIGHT femoral nailing EXAM: RIGHT FEMUR PORTABLE 2 VIEW COMPARISON:  Intraoperative images 03/26/2017 FINDINGS: IM nail with proximal compression screw and distal locking screw identified within RIGHT femur post ORIF of a reduced intertrochanteric fracture of the RIGHT femur. Bones appear demineralized. RIGHT hip joint space preserved without dislocation. Degenerative changes RIGHT knee. IMPRESSION: Post ORIF of an intertrochanteric fracture of  the RIGHT femur. Electronically Signed   By: Lavonia Dana M.D.   On: 03/29/2017 10:25    Micro Results   Recent Results (from the past 240 hour(s))  Surgical pcr screen     Status: None   Collection Time: 03/29/17  6:20 AM  Result Value Ref Range Status   MRSA, PCR NEGATIVE NEGATIVE Final   Staphylococcus aureus NEGATIVE NEGATIVE Final  Comment: (NOTE) The Xpert SA Assay (FDA approved for NASAL specimens in patients 17 years of age and older), is one component of a comprehensive surveillance program. It is not intended to diagnose infection nor to guide or monitor treatment. Performed at [facility], [place] [address], [place], [place] [address]        Today   Subjective    Candace Newman today has no new complaints, no f/c, no n/v/d          Patient has been seen and examined prior to discharge   Objective   Blood pressure 132/77, pulse 67, temperature 98.4 F (36.9 C), temperature source Oral, resp. rate 17, height 5\' 2"  (1.575 m), weight 48 kg (105 lb 13.1 oz), SpO2 96 %.   Intake/Output Summary (Last 24 hours) at 04/04/2017 1054 Last data filed at 04/04/2017 0634 Gross per 24 hour  Intake 680 ml  Output -  Net 680 ml    Exam  Gen:- Awake Alert,  In no apparent distress  HEENT:- [place].AT, No sclera icterus Neck-Supple Neck,No JVD,.  Lungs-  CTAB  CV- S1, S2 normal Abd-  +ve B.Sounds, Abd Soft, No tenderness,    Extremity/Skin:-Good pulses, right hip wound is clean dry and intact Psych-affect is appropriate, oriented x3 Neuro-no new focal deficits, no tremors     Data Review   CBC w Diff:  Lab Results  Component Value Date   WBC 10.8 (H) 04/03/2017   HGB 8.5 (L) 04/03/2017   HCT 24.8 (L) 04/03/2017   PLT 238 04/03/2017   LYMPHOPCT 5 03/28/2017   MONOPCT 4 03/28/2017   EOSPCT 0 03/28/2017   BASOPCT 0 03/28/2017    CMP:  Lab Results  Component Value Date   NA 128 (L) 04/04/2017   K 4.1 04/04/2017   CL 96 (L) 04/04/2017   CO2 22  04/04/2017   BUN 13 04/04/2017   CREATININE 0.69 04/04/2017   PROT 6.8 11/21/2016   ALBUMIN 3.9 03/29/2017   BILITOT 0.7 11/21/2016   ALKPHOS 66 11/21/2016   AST 17 11/21/2016   ALT 16 11/21/2016  .   Total Discharge time is about 33 minutes  Roxan Hockey M.D on 04/04/2017 at 10:54 AM  [facility]   Office  [phone]  Voice Recognition Viviann Spare dictation system was used to create this note, attempts have been made to correct errors. Please contact the author with questions and/or clarifications.

## 2017-04-04 NOTE — Social Work (Signed)
Clinical Social Worker facilitated patient discharge including contacting patient family and facility to confirm patient discharge plans.  Clinical information faxed to facility and family agreeable with plan.    CSW arranged ambulance transport via [facility] to [facility].    RN to call [phone] to give report prior to discharge.  Clinical Social Worker will sign off for now as social work intervention is no longer needed. Please consult us again if new need arises.  Ellwyn Ergle, LCSW Clinical Social Worker [phone]      

## 2017-04-04 NOTE — Clinical Social Work Placement (Signed)
   CLINICAL SOCIAL WORK PLACEMENT  NOTE  Date:  04/04/2017  Patient Details  Name: Candace Newman MRN: [id number] Date of Birth: 12-08-1940  Clinical Social Work is seeking post-discharge placement for this patient at the [place] level of care (*CSW will initial, date and re-position this form in  chart as items are completed):  Yes   Patient/family provided with [place] Work Department's list of facilities offering this level of care within the geographic area requested by the patient (or if unable, by the patient's family).  Yes   Patient/family informed of their freedom to choose among providers that offer the needed level of care, that participate in Medicare, Medicaid or managed care program needed by the patient, have an available bed and are willing to accept the patient.  Yes   Patient/family informed of [place]'s ownership interest in [facility] and [facility], as well as of the fact that they are under no obligation to receive care at these facilities.  PASRR submitted to EDS on       PASRR number received on 03/31/17     Existing PASRR number confirmed on       FL2 transmitted to all facilities in geographic area requested by pt/family on 03/31/17     FL2 transmitted to all facilities within larger geographic area on       Patient informed that his/her managed care company has contracts with or will negotiate with certain facilities, including the following:        Yes   Patient/family informed of bed offers received.  Patient chooses bed at [facility]     Physician recommends and patient chooses bed at      Patient to be transferred to [facility] on 04/04/17.  Patient to be transferred to facility by [facility]     Patient family notified on 04/04/17 of transfer.  Name of family member notified:  pt responsible for self     PHYSICIAN       Additional Comment:     _______________________________________________ Normajean Baxter, LCSW 04/04/2017, 11:54 AM

## 2017-04-04 NOTE — Progress Notes (Signed)
Called and reported to Taffy,RN at [facility].

## 2017-04-04 NOTE — Progress Notes (Signed)
Physical Therapy Treatment Patient Details Name: Candace Newman MRN: [id number] DOB: 12/29/1940 Today's Date: 04/04/2017    History of Present Illness Pt is a 77 y.o. female with medical history significant of HTN, DM2, tobacco abuse, HLD, and osteopenia. She presented s/p fall with R hip fx and underwent R hip IM nailing 03-29-17.     PT Comments    Patient continues to make progress toward mobility goals and demonstrated increased activity tolerance. Continue to progress as tolerated with anticipated d/c to SNF for further skilled PT services.     Follow Up Recommendations  SNF;Supervision/Assistance - 24 hour     Equipment Recommendations  Rolling walker with 5" wheels    Recommendations for Other Services       Precautions / Restrictions Precautions Precautions: Fall Restrictions Weight Bearing Restrictions: Yes LLE Weight Bearing: Weight bearing as tolerated    Mobility  Bed Mobility               General bed mobility comments: pt OOB in chair upon arrival  Transfers Overall transfer level: Needs assistance Equipment used: Rolling walker (2 wheeled) Transfers: Sit to/from Stand Sit to Stand: Min assist         General transfer comment: cues for safe hand placement; assist to power up into standing initial stand from recliner and to steady each trial; transfers from recliner and [facility]  Ambulation/Gait Ambulation/Gait assistance: Min assist;+2 safety/equipment;Mod assist(chair follow) Ambulation Distance (Feet): (67ft X2) Assistive device: Rolling walker (2 wheeled) Gait Pattern/deviations: Step-to pattern;Step-through pattern;Decreased stance time - right;Decreased step length - left;Antalgic;Narrow base of support;Trunk flexed;Decreased step length - right     General Gait Details: cues for sequencing, posture, increased bilat step lengths, and proximity to RW; assist for balance, R LE stability during stanch phase at times, and progression of  RW   Stairs            Wheelchair Mobility    Modified Rankin (Stroke Patients Only)       Balance Overall balance assessment: Needs assistance Sitting-balance support: No upper extremity supported;Feet supported Sitting balance-Leahy Scale: Good     Standing balance support: Bilateral upper extremity supported;During functional activity Standing balance-Leahy Scale: Poor Standing balance comment: Reliant on RW and therapist                            Cognition Arousal/Alertness: Awake/alert Behavior During Therapy: WFL for tasks assessed/performed Overall Cognitive Status: Within Functional Limits for tasks assessed                                        Exercises General Exercises - Lower Extremity Ankle Circles/Pumps: AROM;Both;10 reps Quad Sets: AROM;Both;10 reps Heel Slides: Right;10 reps;AROM Hip ABduction/ADduction: AAROM;Right;10 reps    General Comments        Pertinent Vitals/Pain Pain Assessment: Faces Faces Pain Scale: Hurts little more Pain Location: R hip/groin Pain Descriptors / Indicators: Sore Pain Intervention(s): Limited activity within patient's tolerance;Monitored during session;Repositioned;Ice applied    Home Living                      Prior Function            PT Goals (current goals can now be found in the care plan section) Acute Rehab PT Goals Patient Stated Goal: home PT Goal Formulation: With patient/family Time  For Goal Achievement: 04/13/17 Potential to Achieve Goals: Good Progress towards PT goals: Progressing toward goals    Frequency    Min 3X/week      PT Plan Current plan remains appropriate    Co-evaluation              AM-PAC PT "6 Clicks" Daily Activity  Outcome Measure  Difficulty turning over in bed (including adjusting bedclothes, sheets and blankets)?: A Little Difficulty moving from lying on back to sitting on the side of the bed? : A  Lot Difficulty sitting down on and standing up from a chair with arms (e.g., wheelchair, bedside commode, etc,.)?: Unable Help needed moving to and from a bed to chair (including a wheelchair)?: A Little Help needed walking in hospital room?: A Lot Help needed climbing 3-5 steps with a railing? : A Lot 6 Click Score: 13    End of Session Equipment Utilized During Treatment: Gait belt Activity Tolerance: Patient tolerated treatment well Patient left: in chair;with call bell/phone within reach Nurse Communication: Mobility status PT Visit Diagnosis: Other abnormalities of gait and mobility (R26.89);Difficulty in walking, not elsewhere classified (R26.2);Pain Pain - Right/Left: Right Pain - part of body: Hip     Time: 8115-7262 PT Time Calculation (min) (ACUTE ONLY): 26 min  Charges:  $Gait Training: 8-22 mins $Therapeutic Exercise: 8-22 mins                    G Codes:       Earney Navy, PTA Pager: [phone]     Darliss Cheney 04/04/2017, 9:59 AM

## 2017-04-07 ENCOUNTER — Telehealth: Payer: Self-pay | Admitting: *Deleted

## 2017-04-07 NOTE — Telephone Encounter (Signed)
Attempted to contact pt to complete TCM; unable to leave vm 

## 2017-04-08 NOTE — Care Management Important Message (Signed)
Important Message  Patient Details  Name: Candace Newman MRN: [id number] Date of Birth: 07-09-1940   Medicare Important Message Given:  Yes    Orbie Pyo 04/08/2017, 2:35 PM

## 2017-05-07 DEATH — deceased

## 2017-06-09 ENCOUNTER — Ambulatory Visit: Payer: Medicare Other

## 2017-06-17 ENCOUNTER — Encounter: Payer: Medicare Other | Admitting: Family Medicine

## 2018-10-30 IMAGING — DX DG FEMUR 2+V PORT*R*
5 series · 6 of 6 positions shown · non-contrast
Comparison: Intraoperative images 03/26/2017

CLINICAL DATA: Postop check RIGHT femoral nailing

EXAM:
RIGHT FEMUR PORTABLE 2 VIEW

[femur ap (1 of 2)]
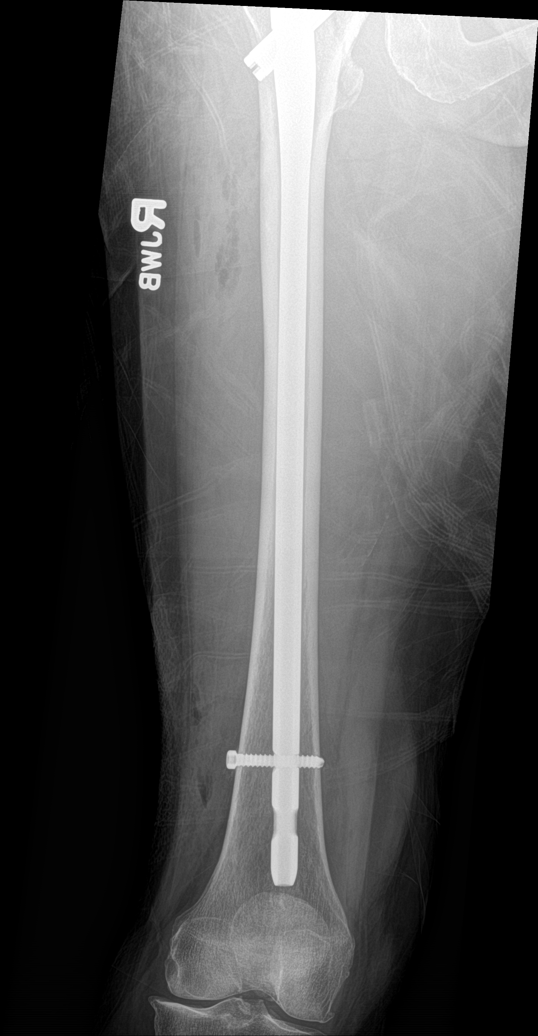

[femur ap (2 of 2)]
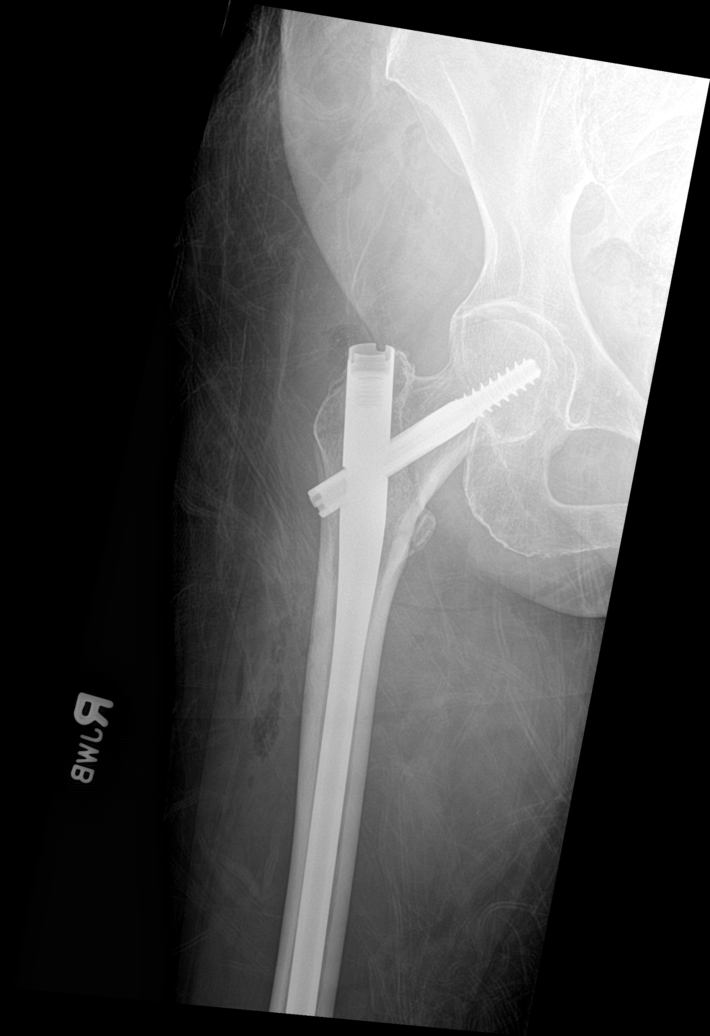

[femur lat (1 of 3)]
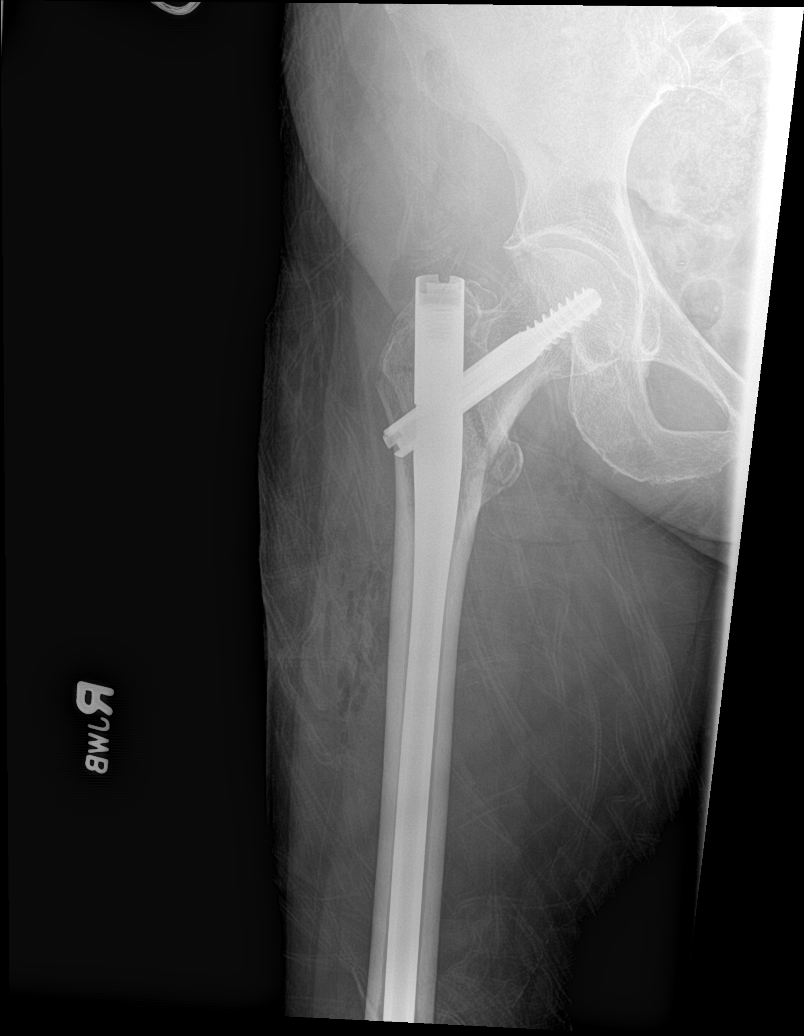

[Series 6: femur lat · 0.14mm/px · 2 of 2 slices shown (2 of 3)]
[im 1/2]
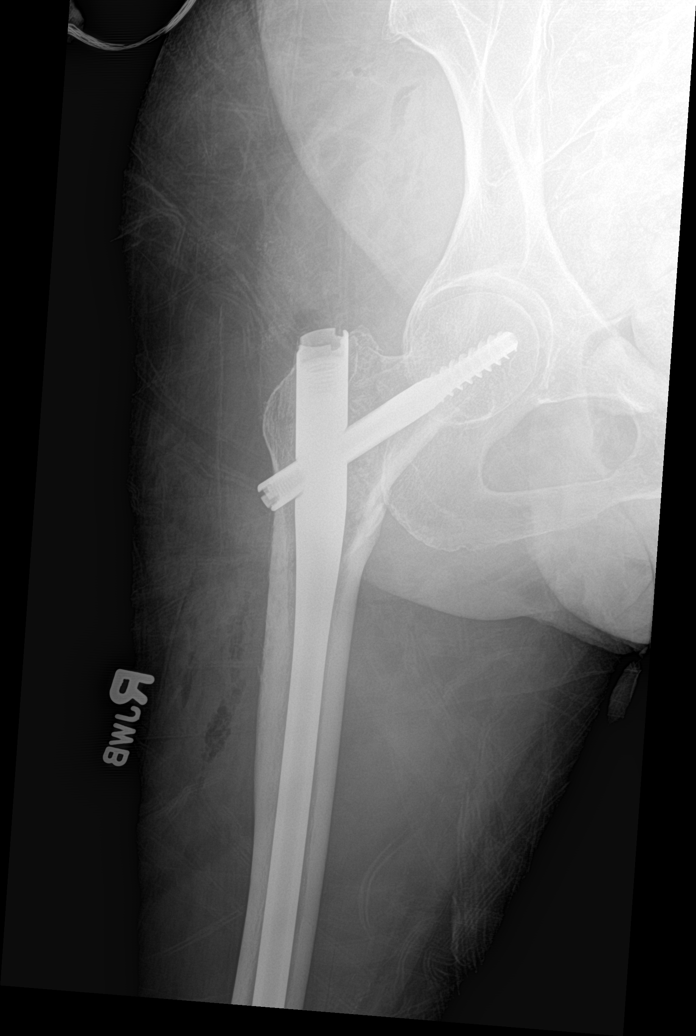
[im 2/2]
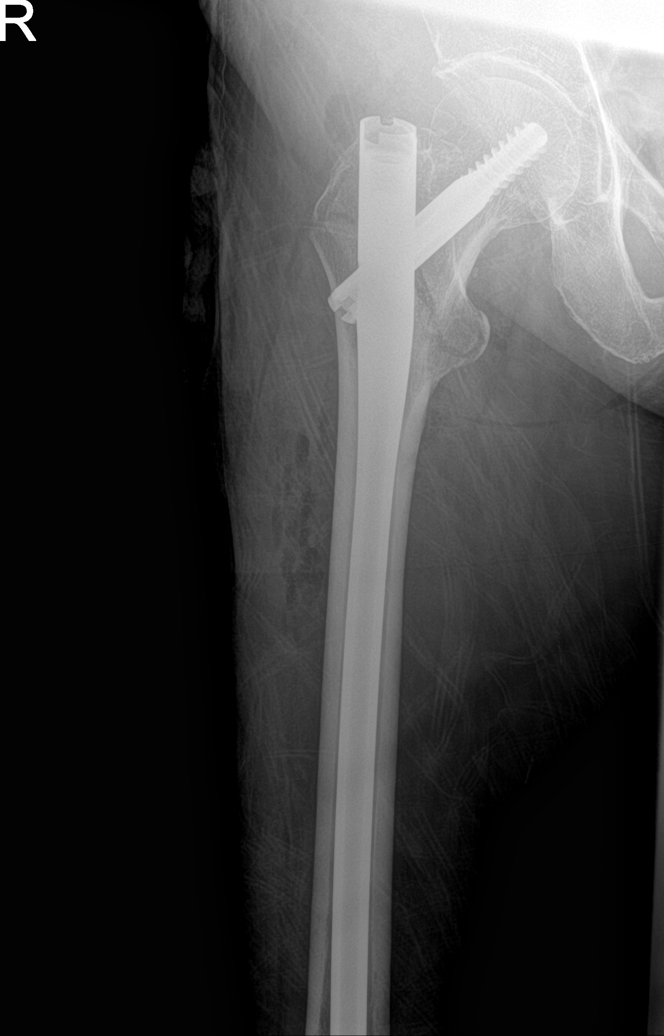

[femur lat (3 of 3)]
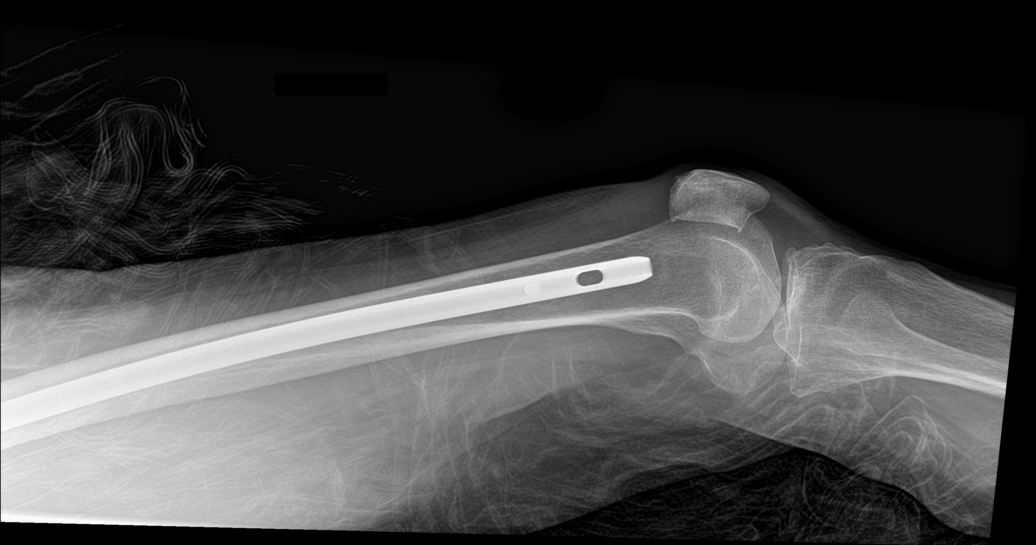

[6 of 6 positions shown; findings below may reference images not displayed]

FINDINGS: IM nail with proximal compression screw and distal locking screw
identified within RIGHT femur post ORIF of a reduced
intertrochanteric fracture of the RIGHT femur.

Bones appear demineralized.

RIGHT hip joint space preserved without dislocation.

Degenerative changes RIGHT knee.
IMPRESSION: Post ORIF of an intertrochanteric fracture of the RIGHT femur.
